# Patient Record
Sex: Male | Born: 1945 | Race: White | Hispanic: No | Marital: Married | Smoking: Never smoker
Health system: Southern US, Community
[De-identification: ages and names within clinical notes are randomized; demographics above are authoritative.]

## PROBLEM LIST (undated history)

## (undated) DIAGNOSIS — E785 Hyperlipidemia, unspecified: Secondary | ICD-10-CM

## (undated) DIAGNOSIS — Z86010 Personal history of colonic polyps: Secondary | ICD-10-CM

## (undated) HISTORY — PX: EYE SURGERY: SHX253

## (undated) HISTORY — PX: TONSILECTOMY, ADENOIDECTOMY, BILATERAL MYRINGOTOMY AND TUBES: SHX2538

## (undated) HISTORY — PX: INGUINAL HERNIA REPAIR: SUR1180

## (undated) HISTORY — DX: Personal history of colonic polyps: Z86.010

## (undated) HISTORY — PX: BACK SURGERY: SHX140

## (undated) HISTORY — DX: Hyperlipidemia, unspecified: E78.5

---

## 2005-06-10 HISTORY — PX: COLONOSCOPY W/ POLYPECTOMY: SHX1380

## 2009-06-13 ENCOUNTER — Ambulatory Visit: Payer: Self-pay | Admitting: Family Medicine

## 2009-06-13 DIAGNOSIS — E781 Pure hyperglyceridemia: Secondary | ICD-10-CM | POA: Insufficient documentation

## 2009-06-13 LAB — CONVERTED CEMR LAB
Bilirubin Urine: NEGATIVE
Blood in Urine, dipstick: NEGATIVE
Ketones, urine, test strip: NEGATIVE
Nitrite: NEGATIVE
Urobilinogen, UA: 0.2

## 2009-06-19 LAB — CONVERTED CEMR LAB
Alkaline Phosphatase: 81 units/L (ref 39–117)
BUN: 20 mg/dL (ref 6–23)
Basophils Absolute: 0 10*3/uL (ref 0.0–0.1)
Basophils Relative: 0.9 % (ref 0.0–3.0)
Bilirubin, Direct: 0.1 mg/dL (ref 0.0–0.3)
CO2: 30 meq/L (ref 19–32)
Calcium: 9.8 mg/dL (ref 8.4–10.5)
Cholesterol: 147 mg/dL (ref 0–200)
Creatinine, Ser: 1 mg/dL (ref 0.4–1.5)
Eosinophils Absolute: 0.1 10*3/uL (ref 0.0–0.7)
HDL: 38.9 mg/dL — ABNORMAL LOW (ref >39.00)
LDL Cholesterol: 89 mg/dL (ref 0–99)
Lymphocytes Relative: 36.7 % (ref 12.0–46.0)
MCHC: 33.2 g/dL (ref 30.0–36.0)
MCV: 91.2 fL (ref 78.0–100.0)
Monocytes Absolute: 0.5 10*3/uL (ref 0.1–1.0)
Neutrophils Relative %: 48.8 % (ref 43.0–77.0)
PSA: 1.92 ng/mL (ref 0.10–4.00)
Platelets: 213 10*3/uL (ref 150.0–400.0)
RBC: 4.67 M/uL (ref 4.22–5.81)
Total Bilirubin: 1 mg/dL (ref 0.3–1.2)
Total CHOL/HDL Ratio: 4
Total Protein: 7.7 g/dL (ref 6.0–8.3)
Triglycerides: 98 mg/dL (ref 0.0–149.0)
WBC: 4.9 10*3/uL (ref 4.5–10.5)

## 2010-07-09 ENCOUNTER — Other Ambulatory Visit: Payer: Self-pay | Admitting: Family Medicine

## 2010-07-09 ENCOUNTER — Ambulatory Visit
Admission: RE | Admit: 2010-07-09 | Discharge: 2010-07-09 | Payer: Self-pay | Source: Home / Self Care | Attending: Family Medicine | Admitting: Family Medicine

## 2010-07-09 ENCOUNTER — Encounter: Payer: Self-pay | Admitting: Family Medicine

## 2010-07-09 LAB — LIPID PANEL
Cholesterol: 151 mg/dL (ref 0–200)
HDL: 42.2 mg/dL (ref >39.00)
LDL Cholesterol: 93 mg/dL (ref 0–99)
Triglycerides: 79 mg/dL (ref 0.0–149.0)
VLDL: 15.8 mg/dL (ref 0.0–40.0)

## 2010-07-09 LAB — CONVERTED CEMR LAB
Bilirubin Urine: NEGATIVE
Blood in Urine, dipstick: NEGATIVE
Ketones, urine, test strip: NEGATIVE
Specific Gravity, Urine: 1.025
Urobilinogen, UA: 0.2

## 2010-07-09 LAB — CBC WITH DIFFERENTIAL/PLATELET
Basophils Absolute: 0 10*3/uL (ref 0.0–0.1)
Eosinophils Relative: 2.4 % (ref 0.0–5.0)
HCT: 40.6 % (ref 39.0–52.0)
Lymphs Abs: 1.5 10*3/uL (ref 0.7–4.0)
Monocytes Relative: 7.1 % (ref 3.0–12.0)
Neutrophils Relative %: 58.3 % (ref 43.0–77.0)
Platelets: 205 10*3/uL (ref 150.0–400.0)
RDW: 12.9 % (ref 11.5–14.6)
WBC: 4.8 10*3/uL (ref 4.5–10.5)

## 2010-07-09 LAB — HEPATIC FUNCTION PANEL
ALT: 21 U/L (ref 0–53)
AST: 27 U/L (ref 0–37)
Bilirubin, Direct: 0.1 mg/dL (ref 0.0–0.3)
Total Bilirubin: 0.8 mg/dL (ref 0.3–1.2)

## 2010-07-09 LAB — BASIC METABOLIC PANEL
BUN: 23 mg/dL (ref 6–23)
Calcium: 9.5 mg/dL (ref 8.4–10.5)
Creatinine, Ser: 1.2 mg/dL (ref 0.4–1.5)
GFR: 66.49 mL/min (ref >60.00)
Glucose, Bld: 86 mg/dL (ref 70–99)
Potassium: 4.1 mEq/L (ref 3.5–5.1)

## 2010-07-09 LAB — PSA: PSA: 1.71 ng/mL (ref 0.10–4.00)

## 2010-07-09 LAB — TSH: TSH: 3.25 u[IU]/mL (ref 0.35–5.50)

## 2010-07-10 ENCOUNTER — Telehealth: Payer: Self-pay | Admitting: Family Medicine

## 2010-07-10 NOTE — Assessment & Plan Note (Signed)
Summary: new to est-will fast-requesting cpx//ccm   Vital Signs:  Patient profile:   65 year old male Height:      73.5 inches Weight:      190 pounds BMI:     24.82 O2 Sat:      97 % on Room air Temp:     97.9 degrees F oral Pulse rate:   68 / minute BP sitting:   126 / 80  (left arm)  O2 Flow:  Room air CC: NP--Here to est care and requests CPX. Pt denies any problems/symptoms./kb Is Patient Diabetic? No Pain Assessment Patient in pain? no        CC:  NP--Here to est care and requests CPX. Pt denies any problems/symptoms./kb.  History of Present Illness: Hafiz is a 65 year old, married male, nonsmoker, who comes in today as a new patient for evaluation of hyperlipidemia.  He, states he's always been a healthy side.  No chronic health problems.  He does have a history of hypertriglyceridemia.  He is on gemfibrozil 600 mg b.i.d., and one aspirin tablet daily.  He gets routine eye care.  Dental care.  Colonoscopy done in GI 2007 showed a, polyp.  He's due for a follow-up colonoscopy in 2012.  Tetanus booster, unknown.  He'll check his records.  Requesting a flu shot today.  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  Will write him a prescription for the Ana-Kit  Preventive Screening-Counseling & Management  Alcohol-Tobacco     Smoking Status: never  Caffeine-Diet-Exercise     Does Patient Exercise: yes      Drug Use:  no.    Current Medications (verified): 1)  Gemfibrozil 600 Mg Tabs (Gemfibrozil) .Marland Kitchen.. 1 By Mouth Bid 2)  Mvi .Marland Kitchen.. 1 By Mouth Qd 3)  Aspir-Low 81 Mg Tbec (Aspirin) .Marland Kitchen.. 1 By Mouth Qd  Allergies (verified): No Known Drug Allergies  Past History:  Past Medical History: tonsillectomy hernia basal cell carcinoma, right arm colon polyp x 1 anaphylactic reaction to bee sting  Family History: Reviewed history and no changes required. father recently died at age 88 old age  mother died 94,  complications of diabetesno sisters  two brothers, one has a possible bladder cancer  Social History: Reviewed history and no changes required. Retired Married Never Smoked Alcohol use-no Drug use-no Regular exercise-yes Smoking Status:  never Drug Use:  no Does Patient Exercise:  yes  Review of Systems      See HPI  Physical Exam  General:  Well-developed,well-nourished,in no acute distress; alert,appropriate and cooperative throughout examination Head:  Normocephalic and atraumatic without obvious abnormalities. No apparent alopecia or balding. Eyes:  No corneal or conjunctival inflammation noted. EOMI. Perrla. Funduscopic exam benign, without hemorrhages, exudates or papilledema. Vision grossly normal. Ears:  External ear exam shows no significant lesions or deformities.  Otoscopic examination reveals clear canals, tympanic membranes are intact bilaterally without bulging, retraction, inflammation or discharge. Hearing is grossly normal bilaterally. Nose:  External nasal examination shows no deformity or inflammation. Nasal mucosa are pink and moist without lesions or exudates. Mouth:  Oral mucosa and oropharynx without lesions or exudates.  Teeth in good repair. Neck:  No deformities, masses, or tenderness noted. Chest Wall:  No deformities, masses, tenderness or gynecomastia noted. Breasts:  No masses or gynecomastia noted Lungs:  Normal respiratory effort, chest expands symmetrically. Lungs are clear to auscultation, no crackles or wheezes. Heart:  Normal rate and regular rhythm.  S1 and S2 normal without gallop, murmur, click, rub or other extra sounds. Abdomen:  Bowel sounds positive,abdomen soft and non-tender without masses, organomegaly or hernias noted. Rectal:  No external abnormalities noted. Normal sphincter tone. No rectal masses or tenderness. Genitalia:  Testes bilaterally descended without nodularity, tenderness or masses. No scrotal masses or lesions. No penis  lesions or urethral discharge. Prostate:  Prostate gland firm and smooth, no enlargement, nodularity, tenderness, mass, asymmetry or induration. Msk:  No deformity or scoliosis noted of thoracic or lumbar spine.   Pulses:  R and L carotid,radial,femoral,dorsalis pedis and posterior tibial pulses are full and equal bilaterally Extremities:  No clubbing, cyanosis, edema, or deformity noted with normal full range of motion of all joints.   Neurologic:  No cranial nerve deficits noted. Station and gait are normal. Plantar reflexes are down-going bilaterally. DTRs are symmetrical throughout. Sensory, motor and coordinative functions appear intact. Skin:  scar right biceps from previous basal cell carcinoma removal.  Numerous freckles and moles nothing unusual.  Skin changes, consistent with severe childhood [place] Cervical Nodes:  No lymphadenopathy noted Axillary Nodes:  No palpable lymphadenopathy Inguinal Nodes:  No significant adenopathy Psych:  Cognition and judgment appear intact. Alert and cooperative with normal attention span and concentration. No apparent delusions, illusions, hallucinations   Impression & Recommendations:  Problem # 1:  Preventive Health Care (ICD-V70.0) Assessment New  Problem # 2:  HYPERTRIGLYCERIDEMIA (ICD-272.1) Assessment: New  His updated medication list for this problem includes:    Gemfibrozil 600 Mg Tabs (Gemfibrozil) .Marland Kitchen... 1 by mouth bid  Orders: Venipuncture (36644) TLB-Lipid Panel (80061-LIPID) TLB-BMP (Basic Metabolic Panel-BMET) (80048-METABOL) TLB-CBC Platelet - w/Differential (85025-CBCD) TLB-Hepatic/Liver Function Pnl (80076-HEPATIC) TLB-TSH (Thyroid Stimulating Hormone) (84443-TSH) TLB-PSA (Prostate Specific Antigen) (84153-PSA) Prescription Created Electronically [phone]) UA Dipstick w/o Micro (automated)  (81003)  Complete Medication List: 1)  Gemfibrozil 600 Mg Tabs (Gemfibrozil) .Marland Kitchen.. 1 by mouth bid 2)  Mvi  .Marland Kitchen.. 1 by mouth qd 3)   Aspir-low 81 Mg Tbec (Aspirin) .Marland Kitchen.. 1 by mouth qd 4)  Epipen 2-pak 0.3 Mg/0.39ml Devi (Epinephrine) .... Uad  Other Orders: Flu Vaccine 65yrs + (25956) Administration Flu vaccine - MCR (G0008) EKG w/ Interpretation (93000)  Patient Instructions: 1)  Please schedule a follow-up appointment in 1 year. 2)  It is important that you exercise regularly at least 20 minutes 5 times a week. If you develop chest pain, have severe difficulty breathing, or feel very tired , stop exercising immediately and seek medical attention. 3)  Schedule a colonoscopy/sigmoidoscopy to help detect colon cancer. 4)  Take an Aspirin every day. 5)  remember to keep a current ana - kit with you.   6)  also the ear wax kit for your right ear canal Prescriptions: EPIPEN 2-PAK 0.3 MG/0.3ML DEVI (EPINEPHRINE) UAD  #1 x 1   Entered and Authorized by:   Roderick Pee MD   Signed by:   Roderick Pee MD on 06/13/2009   Method used:   Electronically to        [facility]* (mail-order)             ,          Ph: [phone]       Fax: [phone]   RxID:   [id number] GEMFIBROZIL 600 MG TABS (GEMFIBROZIL) 1 by mouth bid  #200 x 3   Entered and Authorized by:   Roderick Pee MD   Signed by:   Roderick Pee MD on 06/13/2009  Method used:   Electronically to        [facility]* (mail-order)             ,          Ph: [phone]       Fax: [phone]   RxID:   [id number]   Flu Vaccine Consent Questions     Do you have a history of severe allergic reactions to this vaccine? no    Any prior history of allergic reactions to egg and/or gelatin? no    Do you have a sensitivity to the preservative Thimersol? no    Do you have a past history of Guillan-Barre Syndrome? no    Do you currently have an acute febrile illness? no    Have you ever had a severe reaction to latex? no    Vaccine information given and explained to patient? yes    Are you currently pregnant? no    Lot Number:[id number]   Exp  Date:12/07/2009   Site Given  Right Deltoid IMdflu Lucious Groves, CMA  Laboratory Results   Urine Tests    Routine Urinalysis   Color: yellow Appearance: Clear Glucose: negative   (Normal Range: Negative) Bilirubin: negative   (Normal Range: Negative) Ketone: negative   (Normal Range: Negative) Spec. Gravity: 1.020   (Normal Range: 1.003-1.035) Blood: negative   (Normal Range: Negative) pH: 5.5   (Normal Range: 5.0-8.0) Protein: negative   (Normal Range: Negative) Urobilinogen: 0.2   (Normal Range: 0-1) Nitrite: negative   (Normal Range: Negative) Leukocyte Esterace: negative   (Normal Range: Negative)    Comments: Joanne Chars CMA  June 13, 2009 12:19 PM

## 2010-07-18 NOTE — Progress Notes (Signed)
Summary: lab results  Phone Note Call from Patient Call back at Home Phone [phone]   Caller: Patient Call For: Roderick Pee MD Summary of Call: Pt is asking to speak to Fleet Contras more about lab results.  Initial call taken by: Lynann Beaver CMA AAMA,  July 10, 2010 9:05 AM  Follow-up for Phone Call        Phone Call Completed Follow-up by: Kern Reap CMA Duncan Dull),  July 10, 2010 11:58 AM

## 2010-07-18 NOTE — Assessment & Plan Note (Signed)
Summary: CPX ( PT WILL COME IN FASTING ) // RS   Vital Signs:  Patient profile:   65 year old male Height:      73.75 inches Weight:      182 pounds BMI:     23.61 Temp:     97.9 degrees F oral BP sitting:   120 / 80  (left arm) Cuff size:   regular  Vitals Entered By: Kern Reap CMA Duncan Dull) (July 09, 2010 8:25 AM)  History of Present Illness: Marcus Reynolds is an 65 year old, married male, nonsmoker, who comes in today for Medicare wellness examination because of a history of underlying hyperlipidemia.  He has had a history of hypertriglyceridemia and has been on gemfibrozil 1200 mg.  Daily, along with an 81 mg, baby aspirin.  We will check his lipid panel today.  History GI care, dental care, colonoscopy in [place], 2006 showed a polyp.  He is to have a follow-up colonoscopy.  This summer.  Tetanus 2005, shingles 2008, Pneumovax today.  Encouraged annual flu shot in the future  He walks 3 miles per day, weight good at 182, no chest pain, shortness of breath.  No ED Here for Medicare AWV:  1.   Risk factors based on Past M, S, F history:..reviewed no changes 2.   Physical Activities: walks daily 3.   Depression/mood: good mood.  No depression 4.   Hearing: normal 5.   ADL's: functions independently 6.   Fall Risk: reviewed.  None identified 7.   Home Safety: no guns in the house 8.   Height, weight, &visual acuity:height weight, normal.  Vision normal 9.   Counseling: continue good health habits 10.   Labs ordered based on risk factors: done today 11.           Referral Coordination.......none indicated 12.           Care Plan.......Marland Kitchenreviewed medications 13.            Cognitive Assessment .Marland Kitchen..oriented x 3 financially independent  Anticoagulation Management History:      Positive risk factors for bleeding include an age of 65 years or older.  The bleeding index is 'intermediate risk'.  Negative CHADS2 values include Age > 65 years old.     Allergies (verified): No Known Drug  Allergies  Past History:  Past medical, surgical, family and social histories (including risk factors) reviewed, and no changes noted (except as noted below).  Past Medical History: Reviewed history from 06/13/2009 and no changes required. tonsillectomy hernia basal cell carcinoma, right arm colon polyp x 1 anaphylactic reaction to bee sting  Family History: Reviewed history from 06/13/2009 and no changes required. father recently died at age 65 old age  mother died 65, complications of diabetesno sisters  two brothers, one has a possible bladder cancer  Social History: Reviewed history from 06/13/2009 and no changes required. Retired Married Never Smoked Alcohol use-no Drug use-no Regular exercise-yes  Review of Systems      See HPI  Physical Exam  General:  Well-developed,well-nourished,in no acute distress; alert,appropriate and cooperative throughout examination Head:  Normocephalic and atraumatic without obvious abnormalities. No apparent alopecia or balding. Eyes:  No corneal or conjunctival inflammation noted. EOMI. Perrla. Funduscopic exam benign, without hemorrhages, exudates or papilledema. Vision grossly normal. Ears:  External ear exam shows no significant lesions or deformities.  Otoscopic examination reveals clear canals, tympanic membranes are intact bilaterally without bulging, retraction, inflammation or discharge. Hearing is grossly normal bilaterally. Nose:  External nasal examination  shows no deformity or inflammation. Nasal mucosa are pink and moist without lesions or exudates. Mouth:  Oral mucosa and oropharynx without lesions or exudates.  Teeth in good repair. Neck:  No deformities, masses, or tenderness noted. Chest Wall:  No deformities, masses, tenderness or gynecomastia noted. Breasts:  No masses or gynecomastia noted Lungs:  Normal respiratory effort, chest expands symmetrically. Lungs are clear to auscultation, no crackles or wheezes. Heart:   Normal rate and regular rhythm. S1 and S2 normal without gallop, murmur, click, rub or other extra sounds. Abdomen:  Bowel sounds positive,abdomen soft and non-tender without masses, organomegaly or hernias noted. Rectal:  No external abnormalities noted. Normal sphincter tone. No rectal masses or tenderness. Genitalia:  Testes bilaterally descended without nodularity, tenderness or masses. No scrotal masses or lesions. No penis lesions or urethral discharge. Prostate:  Prostate gland firm and smooth, no enlargement, nodularity, tenderness, mass, asymmetry or induration. Msk:  No deformity or scoliosis noted of thoracic or lumbar spine.   Pulses:  R and L carotid,radial,femoral,dorsalis pedis and posterior tibial pulses are full and equal bilaterally Extremities:  No clubbing, cyanosis, edema, or deformity noted with normal full range of motion of all joints.   Neurologic:  No cranial nerve deficits noted. Station and gait are normal. Plantar reflexes are down-going bilaterally. DTRs are symmetrical throughout. Sensory, motor and coordinative functions appear intact. Skin:  Intact without suspicious lesions or rashes Cervical Nodes:  No lymphadenopathy noted Axillary Nodes:  No palpable lymphadenopathy Inguinal Nodes:  No significant adenopathy Psych:  Cognition and judgment appear intact. Alert and cooperative with normal attention span and concentration. No apparent delusions, illusions, hallucinations   Impression & Recommendations:  Problem # 1:  HEALTH MAINTENANCE EXAM (ICD-V70.0) Assessment Unchanged  Orders: Venipuncture (16109) TLB-Lipid Panel (80061-LIPID) TLB-BMP (Basic Metabolic Panel-BMET) (80048-METABOL) TLB-CBC Platelet - w/Differential (85025-CBCD) TLB-Hepatic/Liver Function Pnl (80076-HEPATIC) TLB-TSH (Thyroid Stimulating Hormone) (84443-TSH) TLB-PSA (Prostate Specific Antigen) (84153-PSA) Prescription Created Electronically [phone]) Medicare -1st Annual Wellness Visit  [phone]) Urinalysis-dipstick only (Medicare patient) (91478GN) Specimen Handling (56213) EKG w/ Interpretation (93000)  Problem # 2:  HYPERTRIGLYCERIDEMIA (ICD-272.1) Assessment: Improved  His updated medication list for this problem includes:    Gemfibrozil 600 Mg Tabs (Gemfibrozil) .Marland Kitchen... 1 by mouth bid  Orders: Venipuncture (08657) TLB-Lipid Panel (80061-LIPID) TLB-BMP (Basic Metabolic Panel-BMET) (80048-METABOL) TLB-CBC Platelet - w/Differential (85025-CBCD) TLB-Hepatic/Liver Function Pnl (80076-HEPATIC) TLB-TSH (Thyroid Stimulating Hormone) (84443-TSH) TLB-PSA (Prostate Specific Antigen) (84153-PSA) Prescription Created Electronically [phone]) Medicare -1st Annual Wellness Visit [phone]) Urinalysis-dipstick only (Medicare patient) (41324MW) Specimen Handling (10272) EKG w/ Interpretation (93000)  Complete Medication List: 1)  Gemfibrozil 600 Mg Tabs (Gemfibrozil) .Marland Kitchen.. 1 by mouth bid 2)  Mvi  .Marland Kitchen.. 1 by mouth qd 3)  Aspir-low 81 Mg Tbec (Aspirin) .Marland Kitchen.. 1 by mouth qd 4)  Epipen 2-pak 0.3 Mg/0.36ml Devi (Epinephrine) .... Uad  Other Orders: Pneumococcal Vaccine (53664) Admin 1st Vaccine (40347)  Anticoagulation Management Assessment/Plan:            Patient Instructions: 1)  Please schedule a follow-up appointment in 1 year. Prescriptions: GEMFIBROZIL 600 MG TABS (GEMFIBROZIL) 1 by mouth bid  #200 x 3   Entered and Authorized by:   Roderick Pee MD   Signed by:   Roderick Pee MD on 07/09/2010   Method used:   Print then Give to Patient   RxID:   [id number] GEMFIBROZIL 600 MG TABS (GEMFIBROZIL) 1 by mouth bid  #200 x 3   Entered and Authorized by:   Roderick Pee MD  Signed by:   Roderick Pee MD on 07/09/2010   Method used:   Faxed to ...       [facility] (mail-order)             , [place]         Ph: [phone]       Fax: [phone]   RxID:   [id number]    Orders Added: 1)  Venipuncture [62952] 2)  TLB-Lipid Panel [80061-LIPID] 3)  TLB-BMP (Basic  Metabolic Panel-BMET) [80048-METABOL] 4)  TLB-CBC Platelet - w/Differential [85025-CBCD] 5)  TLB-Hepatic/Liver Function Pnl [80076-HEPATIC] 6)  TLB-TSH (Thyroid Stimulating Hormone) [84443-TSH] 7)  TLB-PSA (Prostate Specific Antigen) [84132-GMW] 8)  Prescription Created Electronically [G8553] 9)  Medicare -1st Annual Wellness Visit [G0438] 10)  Urinalysis-dipstick only (Medicare patient) [81003QW] 11)  Specimen Handling [99000] 12)  EKG w/ Interpretation [93000] 13)  Pneumococcal Vaccine [90732] 14)  Admin 1st Vaccine [90471]   Immunization History:  Zostavax History:    Zostavax # 1:  zostavax (06/10/2006)  Immunizations Administered:  Pneumonia Vaccine:    Vaccine Type: Pneumovax    Site: right deltoid    Mfr: Merck    Dose: 0.5 ml    Route: IM    Given by: Kern Reap CMA (AAMA)    Exp. Date: 10/10/2011    Lot #: [id number]    VIS given: 05/15/09 version given July 09, 2010.    Physician counseled: yes   Immunization History:  Zostavax History:    Zostavax # 1:  Zostavax (06/10/2006)  Immunizations Administered:  Pneumonia Vaccine:    Vaccine Type: Pneumovax    Site: right deltoid    Mfr: Merck    Dose: 0.5 ml    Route: IM    Given by: Kern Reap CMA (AAMA)    Exp. Date: 10/10/2011    Lot #: [id number]    VIS given: 05/15/09 version given July 09, 2010.    Physician counseled: yes    Laboratory Results   Urine Tests  Date/Time Recieved: July 09, 2010 11:43 AM  Date/Time Reported: July 09, 2010 11:43 AM   Routine Urinalysis   Color: yellow Appearance: Clear Glucose: negative   (Normal Range: Negative) Bilirubin: negative   (Normal Range: Negative) Ketone: negative   (Normal Range: Negative) Spec. Gravity: 1.025   (Normal Range: 1.003-1.035) Blood: negative   (Normal Range: Negative) pH: 5.5   (Normal Range: 5.0-8.0) Protein: negative   (Normal Range: Negative) Urobilinogen: 0.2   (Normal Range: 0-1) Nitrite: negative   (Normal Range:  Negative) Leukocyte Esterace: negative   (Normal Range: Negative)    Comments: Wynona Canes, CMA  July 09, 2010 11:43 AM

## 2010-11-12 ENCOUNTER — Telehealth: Payer: Self-pay | Admitting: Family Medicine

## 2010-11-12 DIAGNOSIS — Z1211 Encounter for screening for malignant neoplasm of colon: Secondary | ICD-10-CM

## 2010-11-12 DIAGNOSIS — Z0000 Encounter for general adult medical examination without abnormal findings: Secondary | ICD-10-CM

## 2010-11-12 NOTE — Telephone Encounter (Signed)
Was seen for a cpx. Checking on a referral to have a colonoscopy in July.

## 2010-11-13 NOTE — Telephone Encounter (Signed)
Order sent.

## 2010-11-15 ENCOUNTER — Encounter: Payer: Self-pay | Admitting: Internal Medicine

## 2010-12-04 ENCOUNTER — Ambulatory Visit (AMBULATORY_SURGERY_CENTER): Payer: Medicare HMO

## 2010-12-04 VITALS — Ht 74.0 in | Wt 183.0 lb

## 2010-12-04 DIAGNOSIS — Z86010 Personal history of colonic polyps: Secondary | ICD-10-CM

## 2010-12-04 MED ORDER — PEG-KCL-NACL-NASULF-NA ASC-C 100 G PO SOLR: 1.0000 | Freq: Once | ORAL | Status: AC

## 2010-12-05 ENCOUNTER — Encounter: Payer: Self-pay | Admitting: Internal Medicine

## 2010-12-25 ENCOUNTER — Ambulatory Visit (AMBULATORY_SURGERY_CENTER): Payer: Medicare HMO | Admitting: Internal Medicine

## 2010-12-25 ENCOUNTER — Encounter: Payer: Self-pay | Admitting: Internal Medicine

## 2010-12-25 VITALS — BP 144/102 | HR 67 | Temp 97.4°F | Resp 20 | Ht 74.0 in | Wt 178.0 lb

## 2010-12-25 DIAGNOSIS — Z860100 Personal history of colon polyps, unspecified: Secondary | ICD-10-CM

## 2010-12-25 DIAGNOSIS — Z1211 Encounter for screening for malignant neoplasm of colon: Secondary | ICD-10-CM

## 2010-12-25 DIAGNOSIS — K648 Other hemorrhoids: Secondary | ICD-10-CM

## 2010-12-25 HISTORY — DX: Personal history of colonic polyps: Z86.010

## 2010-12-25 HISTORY — PX: COLONOSCOPY: SHX174

## 2010-12-25 HISTORY — DX: Personal history of colon polyps, unspecified: Z86.0100

## 2010-12-25 MED ORDER — SODIUM CHLORIDE 0.9 % IV SOLN: 500.0000 mL | INTRAVENOUS | Status: DC

## 2010-12-25 NOTE — Patient Instructions (Signed)
Please refer to your blue and neon green sheets for instructions regarding diet and exercise for today.  You may resume your medications as you would normally take them.  Repeat Exam in 7 years

## 2010-12-26 ENCOUNTER — Telehealth: Payer: Self-pay

## 2010-12-26 NOTE — Telephone Encounter (Signed)

## 2011-03-26 ENCOUNTER — Ambulatory Visit (INDEPENDENT_AMBULATORY_CARE_PROVIDER_SITE_OTHER): Payer: Medicare HMO

## 2011-03-26 DIAGNOSIS — Z23 Encounter for immunization: Secondary | ICD-10-CM

## 2011-07-23 ENCOUNTER — Ambulatory Visit (INDEPENDENT_AMBULATORY_CARE_PROVIDER_SITE_OTHER): Payer: Medicare HMO | Admitting: Family Medicine

## 2011-07-23 ENCOUNTER — Encounter: Payer: Self-pay | Admitting: Family Medicine

## 2011-07-23 DIAGNOSIS — E781 Pure hyperglyceridemia: Secondary | ICD-10-CM

## 2011-07-23 DIAGNOSIS — N529 Male erectile dysfunction, unspecified: Secondary | ICD-10-CM

## 2011-07-23 DIAGNOSIS — Z79899 Other long term (current) drug therapy: Secondary | ICD-10-CM

## 2011-07-23 DIAGNOSIS — Z0000 Encounter for general adult medical examination without abnormal findings: Secondary | ICD-10-CM

## 2011-07-23 DIAGNOSIS — Z125 Encounter for screening for malignant neoplasm of prostate: Secondary | ICD-10-CM

## 2011-07-23 LAB — HEPATIC FUNCTION PANEL
Alkaline Phosphatase: 101 U/L (ref 39–117)
Bilirubin, Direct: 0.2 mg/dL (ref 0.0–0.3)
Total Bilirubin: 1.1 mg/dL (ref 0.3–1.2)
Total Protein: 7.2 g/dL (ref 6.0–8.3)

## 2011-07-23 LAB — BASIC METABOLIC PANEL
CO2: 29 mEq/L (ref 19–32)
Calcium: 9.9 mg/dL (ref 8.4–10.5)
Chloride: 105 mEq/L (ref 96–112)
Creatinine, Ser: 0.9 mg/dL (ref 0.4–1.5)
Glucose, Bld: 82 mg/dL (ref 70–99)

## 2011-07-23 LAB — POCT URINALYSIS DIPSTICK
Blood, UA: NEGATIVE
Ketones, UA: NEGATIVE
Protein, UA: NEGATIVE
Spec Grav, UA: 1.025
Urobilinogen, UA: 0.2

## 2011-07-23 LAB — CBC WITH DIFFERENTIAL/PLATELET
Basophils Relative: 0.5 % (ref 0.0–3.0)
Eosinophils Absolute: 0.1 10*3/uL (ref 0.0–0.7)
Eosinophils Relative: 2.8 % (ref 0.0–5.0)
HCT: 42.3 % (ref 39.0–52.0)
Hemoglobin: 14.2 g/dL (ref 13.0–17.0)
Lymphs Abs: 1.6 10*3/uL (ref 0.7–4.0)
MCHC: 33.5 g/dL (ref 30.0–36.0)
MCV: 90.3 fl (ref 78.0–100.0)
Monocytes Absolute: 0.4 10*3/uL (ref 0.1–1.0)
Neutro Abs: 3.1 10*3/uL (ref 1.4–7.7)
RBC: 4.68 Mil/uL (ref 4.22–5.81)
WBC: 5.3 10*3/uL (ref 4.5–10.5)

## 2011-07-23 LAB — LIPID PANEL
Cholesterol: 140 mg/dL (ref 0–200)
LDL Cholesterol: 84 mg/dL (ref 0–99)
Total CHOL/HDL Ratio: 3
VLDL: 13.4 mg/dL (ref 0.0–40.0)

## 2011-07-23 LAB — TSH: TSH: 2.45 u[IU]/mL (ref 0.35–5.50)

## 2011-07-23 MED ORDER — GEMFIBROZIL 600 MG PO TABS: 600.0000 mg | ORAL_TABLET | Freq: Two times a day (BID) | ORAL | Status: DC

## 2011-07-23 MED ORDER — SILDENAFIL CITRATE 50 MG PO TABS: 50.0000 mg | ORAL_TABLET | ORAL | Status: DC | PRN

## 2011-07-23 NOTE — Patient Instructions (Signed)
Continue your current medications  Initially try  the Viagra,,,,,,,,,,,,, one half tablet with water one to 2 hours prior to sex  If the 50 mg tablet works better than call and we will e-mail a new prescription for you  Return in one year sooner if any problems

## 2011-07-23 NOTE — Progress Notes (Signed)
  Subjective:    Patient ID: Marcus Reynolds, male    DOB: 1946-01-06, 66 y.o.   MRN: [id number]  HPI Everardo Beals is a 66 year old married male nonsmoker who comes in today for evaluation of hyperlipidemia and a new problem of erectile dysfunction  For hyperlipidemia he takes Lopid 600 mg twice a day and an aspirin tablet  He recently has been having difficulty with erectile dysfunction. We discussed all the options.    Review of Systems  Constitutional: Negative.   HENT: Negative.   Eyes: Negative.   Respiratory: Negative.   Cardiovascular: Negative.   Gastrointestinal: Negative.   Genitourinary: Negative.   Musculoskeletal: Negative.   Skin: Negative.   Neurological: Negative.   Hematological: Negative.   Psychiatric/Behavioral: Negative.        Objective:   Physical Exam  Constitutional: He is oriented to person, place, and time. He appears well-developed and well-nourished.  HENT:  Head: Normocephalic and atraumatic.  Right Ear: External ear normal.  Left Ear: External ear normal.  Nose: Nose normal.  Mouth/Throat: Oropharynx is clear and moist.  Eyes: Conjunctivae and EOM are normal. Pupils are equal, round, and reactive to light.  Neck: Normal range of motion. Neck supple. No JVD present. No tracheal deviation present. No thyromegaly present.  Cardiovascular: Normal rate, regular rhythm, normal heart sounds and intact distal pulses.  Exam reveals no gallop and no friction rub.   No murmur heard. Pulmonary/Chest: Effort normal and breath sounds normal. No stridor. No respiratory distress. He has no wheezes. He has no rales. He exhibits no tenderness.  Abdominal: Soft. Bowel sounds are normal. He exhibits no distension and no mass. There is no tenderness. There is no rebound and no guarding.  Genitourinary: Rectum normal, prostate normal and penis normal. Guaiac negative stool. No penile tenderness.  Musculoskeletal: Normal range of motion. He exhibits no edema and no tenderness.    Lymphadenopathy:    He has no cervical adenopathy.  Neurological: He is alert and oriented to person, place, and time. He has normal reflexes. No cranial nerve deficit. He exhibits normal muscle tone.  Skin: Skin is warm and dry. No rash noted. No erythema. No pallor.       Total body skin exam normal he has light skin and light eyes have a lot of sun damage. He has numerous capillary hemangiomas  Venous lake right scrotum  Psychiatric: He has a normal mood and affect. His behavior is normal. Judgment and thought content normal.          Assessment & Plan:  Healthy male  History of hyperlipidemia check labs  History of erectile dysfunction check labs prescribed Viagra  Chronic sun damage continue sunscreens and followup by dermatologist

## 2012-03-27 ENCOUNTER — Ambulatory Visit (INDEPENDENT_AMBULATORY_CARE_PROVIDER_SITE_OTHER): Payer: Medicare HMO

## 2012-03-27 DIAGNOSIS — Z23 Encounter for immunization: Secondary | ICD-10-CM

## 2012-07-23 ENCOUNTER — Encounter: Payer: Medicare HMO | Admitting: Family Medicine

## 2012-09-08 ENCOUNTER — Encounter: Payer: Medicare HMO | Admitting: Family Medicine

## 2012-09-22 ENCOUNTER — Encounter: Payer: Self-pay | Admitting: Family Medicine

## 2012-09-22 ENCOUNTER — Ambulatory Visit (INDEPENDENT_AMBULATORY_CARE_PROVIDER_SITE_OTHER): Payer: Medicare HMO | Admitting: Family Medicine

## 2012-09-22 VITALS — BP 110/78 | Temp 98.2°F | Ht 73.0 in | Wt 186.0 lb

## 2012-09-22 DIAGNOSIS — E781 Pure hyperglyceridemia: Secondary | ICD-10-CM

## 2012-09-22 DIAGNOSIS — N529 Male erectile dysfunction, unspecified: Secondary | ICD-10-CM

## 2012-09-22 DIAGNOSIS — Z86010 Personal history of colonic polyps: Secondary | ICD-10-CM

## 2012-09-22 DIAGNOSIS — Z0000 Encounter for general adult medical examination without abnormal findings: Secondary | ICD-10-CM

## 2012-09-22 LAB — HEPATIC FUNCTION PANEL
ALT: 20 U/L (ref 0–53)
AST: 24 U/L (ref 0–37)
Albumin: 4.5 g/dL (ref 3.5–5.2)
Alkaline Phosphatase: 85 U/L (ref 39–117)
Bilirubin, Direct: 0.2 mg/dL (ref 0.0–0.3)
Total Protein: 6.8 g/dL (ref 6.0–8.3)

## 2012-09-22 LAB — CBC WITH DIFFERENTIAL/PLATELET
Basophils Absolute: 0 10*3/uL (ref 0.0–0.1)
Eosinophils Absolute: 0.1 10*3/uL (ref 0.0–0.7)
Lymphocytes Relative: 34.5 % (ref 12.0–46.0)
MCHC: 32.7 g/dL (ref 30.0–36.0)
MCV: 90.8 fl (ref 78.0–100.0)
Monocytes Absolute: 0.4 10*3/uL (ref 0.1–1.0)
Neutrophils Relative %: 52.5 % (ref 43.0–77.0)
Platelets: 208 10*3/uL (ref 150.0–400.0)
RDW: 12.4 % (ref 11.5–14.6)

## 2012-09-22 LAB — BASIC METABOLIC PANEL
BUN: 20 mg/dL (ref 6–23)
Calcium: 9.4 mg/dL (ref 8.4–10.5)
GFR: 76.51 mL/min (ref >60.00)
Glucose, Bld: 77 mg/dL (ref 70–99)
Potassium: 4 mEq/L (ref 3.5–5.1)
Sodium: 139 mEq/L (ref 135–145)

## 2012-09-22 LAB — LIPID PANEL
Cholesterol: 132 mg/dL (ref 0–200)
LDL Cholesterol: 87 mg/dL (ref 0–99)
Triglycerides: 55 mg/dL (ref 0.0–149.0)

## 2012-09-22 LAB — POCT URINALYSIS DIPSTICK
Glucose, UA: NEGATIVE
Nitrite, UA: NEGATIVE
Protein, UA: NEGATIVE
Spec Grav, UA: 1.025
Urobilinogen, UA: 0.2

## 2012-09-22 MED ORDER — SILDENAFIL CITRATE 100 MG PO TABS: ORAL_TABLET | ORAL | Status: DC

## 2012-09-22 MED ORDER — GEMFIBROZIL 600 MG PO TABS: 600.0000 mg | ORAL_TABLET | Freq: Two times a day (BID) | ORAL | Status: DC

## 2012-09-22 NOTE — Progress Notes (Signed)
  Subjective:    Patient ID: Marcus Reynolds, male    DOB: 05/31/1946, 67 y.o.   MRN: [id number]  HPI Aiman is a 67 year old married male nonsmoker who comes in today for a Medicare wellness examination  He has a history of elevated triglycerides and takes Lopid 600 mg twice a day Will check labs today. He also takes an aspirin tablet and Viagra when necessary for ED  He gets routine eye care, dental care, followup colonoscopy and GI, vaccinations up-to-date  Cognitive function normal he walks on a regular basis home health safety reviewed no issues identified, no guns in the house, he does have a health care power of attorney and living well   Review of Systems  Constitutional: Negative.   HENT: Negative.   Eyes: Negative.   Respiratory: Negative.   Cardiovascular: Negative.   Gastrointestinal: Negative.   Genitourinary: Negative.   Musculoskeletal: Negative.   Skin: Negative.   Neurological: Negative.   Psychiatric/Behavioral: Negative.        Objective:   Physical Exam  Constitutional: He is oriented to person, place, and time. He appears well-developed and well-nourished.  HENT:  Head: Normocephalic and atraumatic.  Right Ear: External ear normal.  Left Ear: External ear normal.  Nose: Nose normal.  Mouth/Throat: Oropharynx is clear and moist.  Eyes: Conjunctivae and EOM are normal. Pupils are equal, round, and reactive to light.  Neck: Normal range of motion. Neck supple. No JVD present. No tracheal deviation present. No thyromegaly present.  Cardiovascular: Normal rate, regular rhythm, normal heart sounds and intact distal pulses.  Exam reveals no gallop and no friction rub.   No murmur heard. Pulmonary/Chest: Effort normal and breath sounds normal. No stridor. No respiratory distress. He has no wheezes. He has no rales. He exhibits no tenderness.  Abdominal: Soft. Bowel sounds are normal. He exhibits no distension and no mass. There is no tenderness. There is no rebound and  no guarding.  Genitourinary: Rectum normal and penis normal. Guaiac negative stool. No penile tenderness.  1+ symmetrical nonnodular BPH  Musculoskeletal: Normal range of motion. He exhibits no edema and no tenderness.  Lymphadenopathy:    He has no cervical adenopathy.  Neurological: He is alert and oriented to person, place, and time. He has normal reflexes. No cranial nerve deficit. He exhibits normal muscle tone.  Skin: Skin is warm and dry. No rash noted. No erythema. No pallor.  Total body skin exam normal he has a plethora of freckles moles capillaries hemangiomas skin damage from acne as a teenager. He's had basal cells removed by his dermatologist and sees them in August for skin evaluation yearly  Psychiatric: He has a normal mood and affect. His behavior is normal. Judgment and thought content normal.          Assessment & Plan:  Healthy male  Hypertriglyceridemia continue Lopid 600 twice a day and aspirin  Erectile dysfunction continue Viagra when necessary  Nocturia x3 DC caffeine  History of skin cancer basal cells followup by dermatology in August where sunscreens daily

## 2012-09-22 NOTE — Patient Instructions (Signed)
Continue the Lopid twice daily with an aspirin tablet  Viagra 100 mg when necessary  Go on a caffeine free diet for 2 weeks to see if it helps your urinary tract symptoms  Return in one year sooner if any problems

## 2012-09-23 ENCOUNTER — Encounter: Payer: Self-pay | Admitting: Family Medicine

## 2012-10-28 ENCOUNTER — Other Ambulatory Visit (INDEPENDENT_AMBULATORY_CARE_PROVIDER_SITE_OTHER): Payer: Medicare HMO

## 2012-10-28 DIAGNOSIS — D649 Anemia, unspecified: Secondary | ICD-10-CM

## 2012-10-28 DIAGNOSIS — R710 Precipitous drop in hematocrit: Secondary | ICD-10-CM

## 2012-10-28 LAB — CBC WITH DIFFERENTIAL/PLATELET
Basophils Relative: 0.7 % (ref 0.0–3.0)
Eosinophils Relative: 2 % (ref 0.0–5.0)
Lymphocytes Relative: 27.4 % (ref 12.0–46.0)
Neutrophils Relative %: 60.9 % (ref 43.0–77.0)
RBC: 4.49 Mil/uL (ref 4.22–5.81)
WBC: 5.5 10*3/uL (ref 4.5–10.5)

## 2012-11-04 NOTE — Telephone Encounter (Signed)
I had my hemoglobin rechecked last week (appt was on Wed 5/21). When Dr. Tawanna Cooler has a moment could he review the results and let me know if he comfortable with them, and should I resume my aspirin regimen? Also, should I continue to take the iron supplement?

## 2013-02-25 ENCOUNTER — Emergency Department (HOSPITAL_COMMUNITY)
Admission: EM | Admit: 2013-02-25 | Discharge: 2013-02-25 | Disposition: A | Discharge status: Home / Self Care | Payer: Medicare HMO | Source: Home / Self Care

## 2013-02-25 ENCOUNTER — Encounter (HOSPITAL_COMMUNITY): Payer: Self-pay | Admitting: Emergency Medicine

## 2013-02-25 DIAGNOSIS — R600 Localized edema: Secondary | ICD-10-CM

## 2013-02-25 DIAGNOSIS — R609 Edema, unspecified: Secondary | ICD-10-CM

## 2013-02-25 DIAGNOSIS — T63481A Toxic effect of venom of other arthropod, accidental (unintentional), initial encounter: Secondary | ICD-10-CM

## 2013-02-25 DIAGNOSIS — T6391XA Toxic effect of contact with unspecified venomous animal, accidental (unintentional), initial encounter: Secondary | ICD-10-CM

## 2013-02-25 MED ORDER — DOXYCYCLINE HYCLATE 100 MG PO CAPS: 100.0000 mg | ORAL_CAPSULE | Freq: Two times a day (BID) | ORAL | Status: DC

## 2013-02-25 NOTE — ED Provider Notes (Signed)
Medical screening examination/treatment/procedure(s) were performed by non-physician practitioner and as supervising physician I was immediately available for consultation/collaboration.  Leslee Home, M.D.  Reuben Likes, MD 02/25/13 2017

## 2013-02-25 NOTE — ED Provider Notes (Signed)
CSN: [id number]     Arrival date & time 02/25/13  1837 History   None    Chief Complaint  Patient presents with  . Leg Swelling   (Consider location/radiation/quality/duration/timing/severity/associated sxs/prior Treatment) HPI Comments: 67 year old male presents with swelling in the right lower leg since 2 days ago or approximate 36 hours. He felt a sting or a pinch to the right mid calf which eventually left a reddish purplish cutaneous discoloration; an hour or 2 later he developed itching around the area. Over the past 18-24 hours he noticed increased swelling of the right lower extremity in the ankle. He did walk 3 miles earlier today which seemed exacerbated the edema. He denies pain in the extremity and denies tenderness. He denies any known injury. Denies systemic symptoms.   Past Medical History  Diagnosis Date  . Personal history of colonic polyps 12/25/2010   Past Surgical History  Procedure Laterality Date  . Tonsilectomy, adenoidectomy, bilateral myringotomy and tubes    . Inguinal hernia repair      right side  . Colonoscopy w/ polypectomy  2007    [place], [place]  . Colonoscopy  12/25/2010    internal hemorrhoids   Family History  Problem Relation Age of Onset  . Heart disease Mother   . Diabetes Mother    History  Substance Use Topics  . Smoking status: Never Smoker   . Smokeless tobacco: Never Used  . Alcohol Use: No    Review of Systems  Constitutional: Negative.   HENT: Negative.   Respiratory: Negative.   Cardiovascular: Negative.   Gastrointestinal: Negative.   Genitourinary: Negative.   Neurological: Negative.     Allergies  Review of patient's allergies indicates no known allergies.  Home Medications   Current Outpatient Rx  Name  Route  Sig  Dispense  Refill  . aspirin 81 MG tablet   Oral   Take 81 mg by mouth daily.           Marland Kitchen doxycycline (VIBRAMYCIN) 100 MG capsule   Oral   Take 1 capsule (100 mg total) by mouth 2 (two) times  daily.   20 capsule   0   . gemfibrozil (LOPID) 600 MG tablet   Oral   Take 1 tablet (600 mg total) by mouth 2 (two) times daily before a meal.   200 tablet   3   . Multiple Vitamin (MULTIVITAMIN) tablet   Oral   Take 1 tablet by mouth daily.           . sildenafil (VIAGRA) 100 MG tablet      Uses directed   5 tablet   5   . EXPIRED: sildenafil (VIAGRA) 50 MG tablet   Oral   Take 1 tablet (50 mg total) by mouth as needed for erectile dysfunction.   6 tablet   11    BP 133/73  Pulse 63  Temp(Src) 97.3 F (36.3 C) (Oral)  Resp 16  SpO2 100% Physical Exam  Nursing note and vitals reviewed. Constitutional: He is oriented to person, place, and time. He appears well-developed and well-nourished. No distress.  Eyes: Conjunctivae and EOM are normal.  Neck: Normal range of motion. Neck supple.  Cardiovascular: Normal rate and regular rhythm.   Pulmonary/Chest: Effort normal and breath sounds normal. No respiratory distress.  Musculoskeletal:  There is edema involving two thirds of his right lower extremity below the knee. There is a cutaneous purplish mark in the proximal anterior shin. Distal to this mark there  are 2 abrasion areas located above the ankle. There is no tenderness anywhere in the length of the leg. Distal pulses 2+ distal neurovascular motor sensory is intact. There is a faint red heave surrounding the lateral malleolus. No lymphangitis. No open areas.  Neurological: He is alert and oriented to person, place, and time. He exhibits normal muscle tone.  Skin: Skin is warm and dry.  Psychiatric: He has a normal mood and affect.    ED Course  Procedures (including critical care time) Labs Review Labs Reviewed - No data to display Imaging Review No results found.  MDM   1. Insect sting allergy, current reaction, initial encounter   2. Edema of right lower extremity      Discussed instructions in detail. We discussed signs and symptoms of infection.  Should there be any increase or initiation of the symptoms started doxycycline twice a day tomorrow. For tonight and tomorrow do not want your regular 3 miles. Keep it elevated apply ice and take Benadryl every 4 hours.  Hayden Rasmussen, NP 02/25/13 2003

## 2013-02-25 NOTE — ED Notes (Signed)
Pt c/o right foot selling onset yest Believes he may have been bitten by an insect??? Sxs include: swelling and itching Denies: inj/trauma, pain He is alert w/no signs of acute distress.

## 2013-02-26 ENCOUNTER — Telehealth: Payer: Self-pay | Admitting: Family Medicine

## 2013-02-26 NOTE — Telephone Encounter (Signed)
[facility] Triage Call Report Triage Record Num: [id number] Operator: Tomasita Crumble Patient Name: Marcus Reynolds Call Date & Time: 02/25/2013 5:45:07PM Patient Phone: [phone] PCP: Eugenio Hoes. Todd Patient Gender: Male PCP Fax : [phone] Patient DOB: 12/06/1945 [facility] Name: [facility] Reason for Call: Patient reports insect bite to lower leg/ ankle 02/23/13; itched 02/25/13. Now lower calf and ankle swelling reported. Took antihistamines on 9/17 pm. Symptoms worsened after 12 hours. He did not feel anything like a sting from bee, yellow jacket, hornet or wasp. Emergent symptoms ruled out. See Provider within 4 hours due to Any signs ands symptoms of localized infection that are worsening or tht have not improved with 24 hours of home care. Caller directed to [facility] per practice preference. Protocol(s) Used: Bites and Stings - Insects or Spiders Recommended Outcome per Protocol: See Provider within 4 hours Reason for Outcome: Any signs and symptoms of localized infection that are worsening or that have not improved with 24 hours of home care. Care Advice: ~ Call provider if symptoms worsen or new symptoms develop. Analgesic/Antipyretic Advice - Acetaminophen: Consider acetaminophen as directed on label or by pharmacist/provider for pain or fever PRECAUTIONS: - Use if there is no history of liver disease, alcoholism, or intake of three or more alcohol drinks per day - Only if approved by provider during pregnancy or when breastfeeding - During pregnancy, acetaminophen should not be taken more than 3 consecutive days without telling provider - Do not exceed recommended dose or frequency ~ Apply local moist heat (such as a warm, wet wash cloth covered with plastic wrap) to the area for 15-20 minutes every 2-3 hours while awake. ~ Analgesic/Antipyretic Advice - NSAIDs: Consider aspirin, ibuprofen, naproxen or ketoprofen for pain or fever as directed on label or by  pharmacist/provider. PRECAUTIONS: - If over 81 years of age, should not take longer than 1 week without consulting provider. EXCEPTIONS: - Should not be used if taking blood thinners or have bleeding problems. - Do not use if have history of sensitivity/allergy to any of these medications; or history of cardiovascular, ulcer, kidney, liver disease or diabetes unless approved by provider. - Do not exceed recommended dose or frequency. ~ 02/25/2013 6:01:48PM Page 1 of 1 [id number]

## 2013-03-24 ENCOUNTER — Ambulatory Visit (INDEPENDENT_AMBULATORY_CARE_PROVIDER_SITE_OTHER): Payer: Medicare HMO

## 2013-03-24 DIAGNOSIS — Z23 Encounter for immunization: Secondary | ICD-10-CM

## 2013-09-23 ENCOUNTER — Encounter: Payer: Self-pay | Admitting: Family Medicine

## 2013-09-23 ENCOUNTER — Ambulatory Visit (INDEPENDENT_AMBULATORY_CARE_PROVIDER_SITE_OTHER): Payer: Medicare HMO | Admitting: Family Medicine

## 2013-09-23 VITALS — BP 130/90 | Temp 97.5°F | Ht 73.5 in | Wt 180.0 lb

## 2013-09-23 DIAGNOSIS — N138 Other obstructive and reflux uropathy: Secondary | ICD-10-CM

## 2013-09-23 DIAGNOSIS — Z86010 Personal history of colonic polyps: Secondary | ICD-10-CM

## 2013-09-23 DIAGNOSIS — N401 Enlarged prostate with lower urinary tract symptoms: Secondary | ICD-10-CM

## 2013-09-23 DIAGNOSIS — Z0000 Encounter for general adult medical examination without abnormal findings: Secondary | ICD-10-CM | POA: Insufficient documentation

## 2013-09-23 DIAGNOSIS — R351 Nocturia: Secondary | ICD-10-CM

## 2013-09-23 DIAGNOSIS — E781 Pure hyperglyceridemia: Secondary | ICD-10-CM

## 2013-09-23 DIAGNOSIS — Z23 Encounter for immunization: Secondary | ICD-10-CM

## 2013-09-23 DIAGNOSIS — N529 Male erectile dysfunction, unspecified: Secondary | ICD-10-CM

## 2013-09-23 LAB — BASIC METABOLIC PANEL WITH GFR
BUN: 23 mg/dL (ref 6–23)
CO2: 28 meq/L (ref 19–32)
Calcium: 10.1 mg/dL (ref 8.4–10.5)
Chloride: 100 meq/L (ref 96–112)
Creatinine, Ser: 1 mg/dL (ref 0.4–1.5)
GFR: 75.43 mL/min
Glucose, Bld: 81 mg/dL (ref 70–99)
Potassium: 4.9 meq/L (ref 3.5–5.1)
Sodium: 138 meq/L (ref 135–145)

## 2013-09-23 LAB — LIPID PANEL
Cholesterol: 155 mg/dL (ref 0–200)
HDL: 45.6 mg/dL
LDL Cholesterol: 101 mg/dL — ABNORMAL HIGH (ref 0–99)
Total CHOL/HDL Ratio: 3
Triglycerides: 43 mg/dL (ref 0.0–149.0)
VLDL: 8.6 mg/dL (ref 0.0–40.0)

## 2013-09-23 LAB — CBC WITH DIFFERENTIAL/PLATELET
Basophils Absolute: 0 K/uL (ref 0.0–0.1)
Basophils Relative: 0.5 % (ref 0.0–3.0)
Eosinophils Absolute: 0.1 K/uL (ref 0.0–0.7)
Eosinophils Relative: 2.2 % (ref 0.0–5.0)
HCT: 44.3 % (ref 39.0–52.0)
Hemoglobin: 14.9 g/dL (ref 13.0–17.0)
Lymphocytes Relative: 28 % (ref 12.0–46.0)
Lymphs Abs: 1.8 K/uL (ref 0.7–4.0)
MCHC: 33.6 g/dL (ref 30.0–36.0)
MCV: 90.8 fl (ref 78.0–100.0)
Monocytes Absolute: 0.5 K/uL (ref 0.1–1.0)
Monocytes Relative: 8 % (ref 3.0–12.0)
Neutro Abs: 3.9 K/uL (ref 1.4–7.7)
Neutrophils Relative %: 61.3 % (ref 43.0–77.0)
Platelets: 237 K/uL (ref 150.0–400.0)
RBC: 4.88 Mil/uL (ref 4.22–5.81)
RDW: 13.2 % (ref 11.5–14.6)
WBC: 6.4 K/uL (ref 4.5–10.5)

## 2013-09-23 LAB — HEPATIC FUNCTION PANEL
ALT: 18 U/L (ref 0–53)
AST: 26 U/L (ref 0–37)
Albumin: 4.5 g/dL (ref 3.5–5.2)
Alkaline Phosphatase: 103 U/L (ref 39–117)
Bilirubin, Direct: 0.1 mg/dL (ref 0.0–0.3)
Total Bilirubin: 1.2 mg/dL (ref 0.3–1.2)
Total Protein: 7.3 g/dL (ref 6.0–8.3)

## 2013-09-23 LAB — POCT URINALYSIS DIPSTICK
Bilirubin, UA: NEGATIVE
Blood, UA: NEGATIVE
Glucose, UA: NEGATIVE
KETONES UA: NEGATIVE
Leukocytes, UA: NEGATIVE
Nitrite, UA: NEGATIVE
PH UA: 5
PROTEIN UA: NEGATIVE
SPEC GRAV UA: 1.025
UROBILINOGEN UA: 0.2

## 2013-09-23 LAB — TSH: TSH: 3.76 u[IU]/mL (ref 0.35–5.50)

## 2013-09-23 LAB — PSA: PSA: 1.94 ng/mL (ref 0.10–4.00)

## 2013-09-23 MED ORDER — GEMFIBROZIL 600 MG PO TABS: 600.0000 mg | ORAL_TABLET | Freq: Two times a day (BID) | ORAL | Status: DC

## 2013-09-23 MED ORDER — SILDENAFIL CITRATE 100 MG PO TABS: ORAL_TABLET | ORAL | Status: DC

## 2013-09-23 NOTE — Progress Notes (Signed)
Pre visit review using our clinic review tool, if applicable. No additional management support is needed unless otherwise documented below in the visit note. 

## 2013-09-23 NOTE — Patient Instructions (Signed)
Lopid,,,,,,,,,,,, one twice daily  One aspirin daily  Viagra 100 mg............  Continue daily exercise and good diet followup in 1 year sooner if any problem

## 2013-09-23 NOTE — Progress Notes (Signed)
   Subjective:    Patient ID: Marcus Reynolds, male    DOB: 28-Jan-1946, 68 y.o.   MRN: [id number]  HPI Marcus Reynolds  is a 67 year old married male nonsmoker who comes in today for his Medicare wellness examination  He takes aspirin and Lopid for hyperlipidemia  He takes Viagra when necessary for ED  He gets routine eye care, dental care, frequent colonoscopies because of a history of colon polyps. His last colonoscopy is a 43 was normal.  Cognitive function normal he walks on a regular basis home health safety reviewed no issues identified, no guns in the house, he does have a health care power of attorney and living well.  Vaccinations updated by [name]   Review of Systems  Constitutional: Negative.   HENT: Negative.   Eyes: Negative.   Respiratory: Negative.   Cardiovascular: Negative.   Gastrointestinal: Negative.   Genitourinary: Negative.   Musculoskeletal: Negative.   Skin: Negative.   Neurological: Negative.   Psychiatric/Behavioral: Negative.        Objective:   Physical Exam  Nursing note and vitals reviewed. Constitutional: He is oriented to person, place, and time. He appears well-developed and well-nourished.  HENT:  Head: Normocephalic and atraumatic.  Right Ear: External ear normal.  Left Ear: External ear normal.  Nose: Nose normal.  Mouth/Throat: Oropharynx is clear and moist.  Eyes: Conjunctivae and EOM are normal. Pupils are equal, round, and reactive to light.  Neck: Normal range of motion. Neck supple. No JVD present. No tracheal deviation present. No thyromegaly present.  Cardiovascular: Normal rate, regular rhythm, normal heart sounds and intact distal pulses.  Exam reveals no gallop and no friction rub.   No murmur heard. No carotid or aortic bruits peripheral pulses 2+ and symmetrical  Pulmonary/Chest: Effort normal and breath sounds normal. No stridor. No respiratory distress. He has no wheezes. He has no rales. He exhibits no tenderness.  Abdominal: Soft.  Bowel sounds are normal. He exhibits no distension and no mass. There is no tenderness. There is no rebound and no guarding.  Genitourinary: Rectum normal and penis normal. Guaiac negative stool. No penile tenderness.  1+ symmetrical BPH nonnodular  Musculoskeletal: Normal range of motion. He exhibits no edema and no tenderness.  Lymphadenopathy:    He has no cervical adenopathy.  Neurological: He is alert and oriented to person, place, and time. He has normal reflexes. No cranial nerve deficit. He exhibits normal muscle tone.  Skin: Skin is warm and dry. No rash noted. No erythema. No pallor.  Total body skin exam normal,,,,,,, previous basal cell carcinoma removed from his right arm scar well-healed no evidence of recurrence. He also sees his dermatologist yearly  Psychiatric: He has a normal mood and affect. His behavior is normal. Judgment and thought content normal.          Assessment & Plan:  Healthy male  History of hyperlipidemia check labs  Erectile dysfunction refill Viagra

## 2014-03-22 ENCOUNTER — Ambulatory Visit (INDEPENDENT_AMBULATORY_CARE_PROVIDER_SITE_OTHER): Payer: Medicare HMO | Admitting: *Deleted

## 2014-03-22 ENCOUNTER — Encounter: Payer: Self-pay | Admitting: Family Medicine

## 2014-03-22 DIAGNOSIS — Z23 Encounter for immunization: Secondary | ICD-10-CM

## 2014-09-01 ENCOUNTER — Ambulatory Visit: Payer: Medicare HMO | Admitting: Family Medicine

## 2014-09-27 ENCOUNTER — Ambulatory Visit (INDEPENDENT_AMBULATORY_CARE_PROVIDER_SITE_OTHER): Payer: Medicare HMO | Admitting: Family Medicine

## 2014-09-27 ENCOUNTER — Encounter: Payer: Self-pay | Admitting: Physician Assistant

## 2014-09-27 ENCOUNTER — Ambulatory Visit: Payer: Medicare HMO | Admitting: Family Medicine

## 2014-09-27 ENCOUNTER — Encounter: Payer: Self-pay | Admitting: Family Medicine

## 2014-09-27 VITALS — BP 120/80 | Temp 98.1°F | Ht 73.75 in | Wt 180.4 lb

## 2014-09-27 DIAGNOSIS — N401 Enlarged prostate with lower urinary tract symptoms: Secondary | ICD-10-CM

## 2014-09-27 DIAGNOSIS — R195 Other fecal abnormalities: Secondary | ICD-10-CM | POA: Insufficient documentation

## 2014-09-27 DIAGNOSIS — E781 Pure hyperglyceridemia: Secondary | ICD-10-CM | POA: Diagnosis not present

## 2014-09-27 DIAGNOSIS — Z0000 Encounter for general adult medical examination without abnormal findings: Secondary | ICD-10-CM

## 2014-09-27 DIAGNOSIS — R351 Nocturia: Secondary | ICD-10-CM

## 2014-09-27 DIAGNOSIS — Z23 Encounter for immunization: Secondary | ICD-10-CM | POA: Diagnosis not present

## 2014-09-27 DIAGNOSIS — Z86010 Personal history of colonic polyps: Secondary | ICD-10-CM

## 2014-09-27 DIAGNOSIS — N529 Male erectile dysfunction, unspecified: Secondary | ICD-10-CM

## 2014-09-27 LAB — BASIC METABOLIC PANEL
BUN: 23 mg/dL (ref 6–23)
CHLORIDE: 103 meq/L (ref 96–112)
CO2: 31 meq/L (ref 19–32)
Calcium: 9.7 mg/dL (ref 8.4–10.5)
Creatinine, Ser: 1.13 mg/dL (ref 0.40–1.50)
GFR: 68.34 mL/min (ref >60.00)
Glucose, Bld: 100 mg/dL — ABNORMAL HIGH (ref 70–99)
Potassium: 4.3 mEq/L (ref 3.5–5.1)
SODIUM: 139 meq/L (ref 135–145)

## 2014-09-27 LAB — HEPATIC FUNCTION PANEL
ALT: 15 U/L (ref 0–53)
AST: 21 U/L (ref 0–37)
Albumin: 4.6 g/dL (ref 3.5–5.2)
Alkaline Phosphatase: 101 U/L (ref 39–117)
BILIRUBIN TOTAL: 0.8 mg/dL (ref 0.2–1.2)
Bilirubin, Direct: 0.2 mg/dL (ref 0.0–0.3)
Total Protein: 6.7 g/dL (ref 6.0–8.3)

## 2014-09-27 LAB — CBC WITH DIFFERENTIAL/PLATELET
BASOS PCT: 0.5 % (ref 0.0–3.0)
Basophils Absolute: 0 10*3/uL (ref 0.0–0.1)
Eosinophils Absolute: 0.1 10*3/uL (ref 0.0–0.7)
Eosinophils Relative: 2.2 % (ref 0.0–5.0)
HCT: 41.9 % (ref 39.0–52.0)
Hemoglobin: 14.1 g/dL (ref 13.0–17.0)
LYMPHS ABS: 1.5 10*3/uL (ref 0.7–4.0)
LYMPHS PCT: 29.8 % (ref 12.0–46.0)
MCHC: 33.7 g/dL (ref 30.0–36.0)
MCV: 89 fl (ref 78.0–100.0)
MONO ABS: 0.5 10*3/uL (ref 0.1–1.0)
MONOS PCT: 9.5 % (ref 3.0–12.0)
NEUTROS PCT: 58 % (ref 43.0–77.0)
Neutro Abs: 2.9 10*3/uL (ref 1.4–7.7)
Platelets: 222 10*3/uL (ref 150.0–400.0)
RBC: 4.71 Mil/uL (ref 4.22–5.81)
RDW: 13.1 % (ref 11.5–15.5)
WBC: 4.9 10*3/uL (ref 4.0–10.5)

## 2014-09-27 LAB — POCT URINALYSIS DIPSTICK
BILIRUBIN UA: NEGATIVE
Glucose, UA: NEGATIVE
Ketones, UA: NEGATIVE
Leukocytes, UA: NEGATIVE
NITRITE UA: NEGATIVE
Protein, UA: NEGATIVE
RBC UA: NEGATIVE
Spec Grav, UA: 1.025
Urobilinogen, UA: 0.2
pH, UA: 6

## 2014-09-27 LAB — LIPID PANEL
CHOL/HDL RATIO: 3
Cholesterol: 144 mg/dL (ref 0–200)
HDL: 42.7 mg/dL (ref >39.00)
LDL Cholesterol: 88 mg/dL (ref 0–99)
NonHDL: 101.3
Triglycerides: 69 mg/dL (ref 0.0–149.0)
VLDL: 13.8 mg/dL (ref 0.0–40.0)

## 2014-09-27 LAB — PSA: PSA: 2.32 ng/mL (ref 0.10–4.00)

## 2014-09-27 LAB — TSH: TSH: 1.96 u[IU]/mL (ref 0.35–4.50)

## 2014-09-27 MED ORDER — GEMFIBROZIL 600 MG PO TABS: 600.0000 mg | ORAL_TABLET | Freq: Two times a day (BID) | ORAL | Status: DC

## 2014-09-27 MED ORDER — SILDENAFIL CITRATE 20 MG PO TABS: 20.0000 mg | ORAL_TABLET | Freq: Three times a day (TID) | ORAL | Status: DC

## 2014-09-27 NOTE — Progress Notes (Signed)
Pre visit review using our clinic review tool, if applicable. No additional management support is needed unless otherwise documented below in the visit note. 

## 2014-09-27 NOTE — Patient Instructions (Signed)
Continue the Lopid........ stop the aspirin  No over-the-counter anti-inflammatories only Tylenol  I put in a call to GI for further evaluation  Generic Viagra 20 mg  Labs today we will call you the report  Talbert Forest,,,,,,,,,,,, our new adult nurse practitioner  Rachel's extension is [phone]

## 2014-09-27 NOTE — Progress Notes (Signed)
   Subjective:    Patient ID: Marcus Reynolds, male    DOB: 11-Jun-1945, 69 y.o.   MRN: [id number]  HPI  Marcus Reynolds is a 69 year old married male nonsmoker who comes in today for general physical examination because of a history of hyperlipidemia and erectile dysfunction  He takes Lopid 600 mg twice a day and an aspirin tablet. We'll check lipid panel today   he uses Viagra when necessary for ED   he gets routine eye care, dental care, colonoscopy and GI 2012. He's noticed over the last couple months that he now only has a bowel movement every 2-3 days. He has no abdominal pain and bloating weight loss or rectal bleeding.   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 Tetanus booster given today   Review of Systems  Constitutional: Negative.   HENT: Negative.   Eyes: Negative.   Respiratory: Negative.   Cardiovascular: Negative.   Gastrointestinal: Negative.   Endocrine: Negative.   Genitourinary: Negative.   Musculoskeletal: Negative.   Skin: Negative.   Allergic/Immunologic: Negative.   Neurological: Negative.   Hematological: Negative.   Psychiatric/Behavioral: Negative.        Objective:   Physical Exam  Constitutional: He is oriented to person, place, and time. He appears well-developed and well-nourished.  HENT:  Head: Normocephalic and atraumatic.  Right Ear: External ear normal.  Left Ear: External ear normal.  Nose: Nose normal.  Mouth/Throat: Oropharynx is clear and moist.  Eyes: Conjunctivae and EOM are normal. Pupils are equal, round, and reactive to light.  Neck: Normal range of motion. Neck supple. No JVD present. No tracheal deviation present. No thyromegaly present.  Cardiovascular: Normal rate, regular rhythm, normal heart sounds and intact distal pulses.  Exam reveals no gallop and no friction rub.     No murmur heard. Pulmonary/Chest: Effort normal and breath sounds normal. No stridor. No respiratory distress. He has no wheezes. He has no rales. He exhibits no tenderness.  Abdominal: Soft. Bowel sounds are normal. He exhibits no distension and no mass. There is no tenderness. There is no rebound and no guarding.  Genitourinary: Penis normal. Guaiac positive stool. No penile tenderness.  2+ symmetrical nonnodular BPH.........Marland Kitchen brown stool guaiac positive  Musculoskeletal: Normal range of motion. He exhibits no edema or tenderness.  Lymphadenopathy:    He has no cervical adenopathy.  Neurological: He is alert and oriented to person, place, and time. He has normal reflexes. No cranial nerve deficit. He exhibits normal muscle tone.  Skin: Skin is warm and dry. No rash noted. No erythema. No pallor.  Total body skin exam normal except for scar lower lumbar area from previous disc surgery and right groin from previous hernia surgery  Psychiatric: He has a normal mood and affect. His behavior is normal. Judgment and thought content normal.          Assessment & Plan:  Hyperlipidemia............. check labs  Erectile dysfunction...Marland KitchenMarland KitchenMarland Kitchen refill Viagra  Positive stool guaiac......... consult with GI ASAP  Status post lumbar disc surgery and right inguinal hernia surgery.Marland Kitchen

## 2014-10-03 ENCOUNTER — Telehealth: Payer: Self-pay | Admitting: Physician Assistant

## 2014-10-03 NOTE — Telephone Encounter (Signed)
He is a Consulting civil engineer for adult learners. He can come to afternoon appointments. Per his request, the appointment is moved to 1:30 pm on the same day 10/12/14.

## 2014-10-12 ENCOUNTER — Ambulatory Visit: Payer: Medicare HMO | Admitting: Physician Assistant

## 2014-10-12 ENCOUNTER — Encounter: Payer: Self-pay | Admitting: Physician Assistant

## 2014-10-12 ENCOUNTER — Ambulatory Visit (INDEPENDENT_AMBULATORY_CARE_PROVIDER_SITE_OTHER): Payer: Medicare HMO | Admitting: Physician Assistant

## 2014-10-12 VITALS — BP 112/78 | HR 68 | Ht 73.5 in | Wt 181.8 lb

## 2014-10-12 DIAGNOSIS — Z860100 Personal history of colon polyps, unspecified: Secondary | ICD-10-CM

## 2014-10-12 DIAGNOSIS — K921 Melena: Secondary | ICD-10-CM | POA: Diagnosis not present

## 2014-10-12 MED ORDER — NA SULFATE-K SULFATE-MG SULF 17.5-3.13-1.6 GM/177ML PO SOLN: ORAL | Status: DC

## 2014-10-12 NOTE — Progress Notes (Signed)
Patient ID: Marcus Reynolds, male   DOB: [birth year], 69 y.o.   MRN: [id number]   Subjective:    Patient ID: Marcus Reynolds, male    DOB: 1946/05/05, 69 y.o.   MRN: [id number]  HPI Marcus Reynolds is a pleasant 69 year old white male known to Dr. Carlean Purl. He has history of colon polyps with colonoscopy done in [place] in 2007. We do not have that path report. He had follow-up colonoscopy done in July 2012 which was negative with the exception of internal hemorrhoids. He was asked to follow-up in 7 years. He is referred today by Dr. Sherren Mocha after finding of Hemoccult positive stool on routine physical. Patient says that after that rectal exam per Dr. Sherren Mocha he noted some fresh bright red blood on the tissue. He says he has in the past very occasionally seen a small amount of bright red blood per rectum which she has attributed to hemorrhoids. He had not seen blood for a long time. He has no complaints of abdominal pain and changes in bowel habits etc. Labs are reviewed and most recent hemoglobin 14.1/hct 41.9  Family history is negative for colon cancer  Review of Systems Pertinent positive and negative review of systems were noted in the above HPI section.  All other review of systems was otherwise negative.  Outpatient Encounter Prescriptions as of 10/12/2014  Medication Sig  . gemfibrozil (LOPID) 600 MG tablet Take 1 tablet (600 mg total) by mouth 2 (two) times daily before a meal.  . Multiple Vitamin (MULTIVITAMIN) tablet Take 1 tablet by mouth daily.    . sildenafil (REVATIO) 20 MG tablet Take 1 tablet (20 mg total) by mouth 3 (three) times daily.  Marland Kitchen aspirin 81 MG tablet Take 81 mg by mouth daily.    . Na Sulfate-K Sulfate-Mg Sulf SOLN Take as directed for colonoscopy prep.   Facility-Administered Encounter Medications as of 10/12/2014  Medication  . 0.9 %  sodium chloride infusion   No Known Allergies Patient Active Problem List   Diagnosis Date Noted  . Heme positive stool 09/27/2014  . Routine general medical  examination at a health care facility 09/23/2013  . ED (erectile dysfunction) 07/23/2011  . History of colonic polyps 12/25/2010  . HYPERTRIGLYCERIDEMIA 06/13/2009   History   Social History  . Marital Status: Married    Spouse Name: N/A  . Number of Children: 2  . Years of Education: N/A   Occupational History  . Retired    Social History Main Topics  . Smoking status: Never Smoker   . Smokeless tobacco: Never Used  . Alcohol Use: 0.0 oz/week    0 Standard drinks or equivalent per week     Comment: socially  . Drug Use: No  . Sexual Activity: Not on file   Other Topics Concern  . Not on file   Social History Narrative    Marcus Reynolds family history includes Diabetes in his mother; Heart disease in his mother; Ulcerative colitis in his mother.      Objective:    Filed Vitals:   10/12/14 1321  BP: 112/78  Pulse: 68    Physical Exam  well-developed white male in no acute distress, pleasant blood pressure 112/78 pulse 68 height 6 foot 1 weight 181. HEENT; nontraumatic normocephalic EOMI PERRLA sclera anicteric, Supple; no JVD, Cardiovascular ;regular rate and rhythm with S1-S2 no murmur or gallop, Pulmonary; clear bilaterally, Abdomen; soft nontender nondistended bowel sounds are active there is no palpable mass or hepatosplenomegaly, Rectal; exam not done this  was done with recent physical exam per Dr. Sherren Mocha inpatient Hemoccult positive, Extremities; no clubbing cyanosis or edema skin warm and dry, Psych; mood and affect appropriate       Assessment & Plan:   #1 69 yo WM with heme positive stool on recent physical  Exam. R/O secondary to internal hemorrhoid vs occult lesion #2 personal hx of colon polyps ( ? Type -2007), negative colonoscopy 2012 #3 hyperlipidemia  Plan; Will schedule or colonoscopy with  Dr. Carlean Purl . Procedure discussed in detail with pt and he is agreeable to proceed.   Amy S Esterwood PA-C 10/12/2014   Cc: Dorena Cookey, MD

## 2014-10-12 NOTE — Patient Instructions (Signed)
You have been scheduled for a colonoscopy. Please follow written instructions given to you at your visit today.  Please pick up your prep supplies at the pharmacy within the next 1-3 days. [address]  If you use inhalers (even only as needed), please bring them with you on the day of your procedure. Your physician has requested that you go to www.startemmi.com and enter the access code given to you at your visit today. This web site gives a general overview about your procedure. However, you should still follow specific instructions given to you by our office regarding your preparation for the procedure.

## 2014-10-17 NOTE — Progress Notes (Signed)
Agree with Ms. Esterwood's assessment and plan. Carl E. Gessner, MD, FACG   

## 2014-12-02 ENCOUNTER — Telehealth: Payer: Self-pay | Admitting: Internal Medicine

## 2014-12-02 NOTE — Telephone Encounter (Signed)
I spoke with the patient and he has had several days of diarrhea and feels he has a "GI bug".  He is feeling a little better today. He is scheduled for 12/07/14.  He is advised to make sure that he is drinking plenty of fluid and plan on keeping his appt for 12/07/14.  He is advised that if anything changes and feels terrible on Monday he can call me and I will help him reschedule.  He agrees to this plan

## 2014-12-07 ENCOUNTER — Encounter: Payer: Self-pay | Admitting: Internal Medicine

## 2014-12-07 ENCOUNTER — Ambulatory Visit (AMBULATORY_SURGERY_CENTER): Payer: Medicare HMO | Admitting: Internal Medicine

## 2014-12-07 VITALS — BP 121/78 | HR 61 | Temp 97.6°F | Resp 16 | Ht 73.5 in | Wt 181.0 lb

## 2014-12-07 DIAGNOSIS — K648 Other hemorrhoids: Secondary | ICD-10-CM | POA: Diagnosis not present

## 2014-12-07 DIAGNOSIS — D125 Benign neoplasm of sigmoid colon: Secondary | ICD-10-CM

## 2014-12-07 DIAGNOSIS — Z86010 Personal history of colonic polyps: Secondary | ICD-10-CM

## 2014-12-07 DIAGNOSIS — K921 Melena: Secondary | ICD-10-CM

## 2014-12-07 DIAGNOSIS — D120 Benign neoplasm of cecum: Secondary | ICD-10-CM | POA: Diagnosis not present

## 2014-12-07 MED ORDER — SODIUM CHLORIDE 0.9 % IV SOLN: 500.0000 mL | INTRAVENOUS | Status: DC

## 2014-12-07 NOTE — Op Note (Signed)
[place]  [address]. [place], [address]   COLONOSCOPY PROCEDURE REPORT  PATIENT: Marcus Reynolds, Marcus Reynolds  MR#: [id number] BIRTHDATE: 1946-01-04 , 24  yrs. old GENDER: male ENDOSCOPIST: Gatha Mayer, MD, [facility] PROCEDURE DATE:  12/07/2014 PROCEDURE:   Colonoscopy, diagnostic and Colonoscopy with snare polypectomy First Screening Colonoscopy - Avg.  risk and is 50 yrs.  old or older - No.  Prior Negative Screening - Now for repeat screening. N/A  History of Adenoma - Now for follow-up colonoscopy & has been > or = to 3 yrs.  N/A  Polyps removed today? Yes ASA CLASS:   Class II INDICATIONS:Evaluation of unexplained GI bleeding and Patient is not applicable for Colorectal Neoplasm Risk Assessment for this procedure. MEDICATIONS: Propofol 250 mg IV, Monitored anesthesia care, and Lidocaine 40 mg IV  DESCRIPTION OF PROCEDURE:   After the risks benefits and alternatives of the procedure were thoroughly explained, informed consent was obtained.  The digital rectal exam revealed no abnormalities of the rectum and revealed no prostatic nodules. The LB [id number]  endoscope was introduced through the anus and advanced to the cecum, which was identified by both the appendix and ileocecal valve. No adverse events experienced.   The quality of the prep was excellent.  (MiraLax was used)  The instrument was then slowly withdrawn as the colon was fully examined. Estimated blood loss is zero unless otherwise noted in this procedure report.   COLON FINDINGS: Two sessile polyps ranging from 5 to 68mm in size were found in the sigmoid colon and at the cecum.  Polypectomies were performed with a cold snare.  The resection was complete, the polyp tissue was completely retrieved and sent to histology. Internal hemorrhoids were found.   The examination was otherwise normal.  Retroflexed views revealed internal hemorrhoids. The time to cecum = 4.3 Withdrawal time = 10.3   The scope  was withdrawn and the procedure completed. COMPLICATIONS: There were no immediate complications.  ENDOSCOPIC IMPRESSION: 1.   Two sessile polyps ranging from 5 to 74mm in size were found in the sigmoid colon and at the cecum; polypectomies were performed with a cold snare 2.   Internal hemorrhoids 3.   The examination was otherwise normal - excellent prep  RECOMMENDATIONS: 1.  Timing of repeat colonoscopy will be determined by pathology findings. 2.  Hemorrhoid banding if desired  eSigned:  Gatha Mayer, MD, [facility] 12/07/2014 3:24 PM   cc: Christie Nottingham, MD and The Patient

## 2014-12-07 NOTE — Patient Instructions (Addendum)
I found and removed 2 polyp that look benign. I also saw hemorrhoids. If you would like these treated with banding I can arrange that.  I will let you know pathology results and when to have another routine colonoscopy by mail.  I appreciate the opportunity to care for you. Gatha Mayer, MD, FACG  YOU HAD AN ENDOSCOPIC PROCEDURE TODAY AT [facility]:   Refer to the procedure report that was given to you for any specific questions about what was found during the examination.  If the procedure report does not answer your questions, please call your gastroenterologist to clarify.  If you requested that your care partner not be given the details of your procedure findings, then the procedure report has been included in a sealed envelope for you to review at your convenience later.  YOU SHOULD EXPECT: Some feelings of bloating in the abdomen. Passage of more gas than usual.  Walking can help get rid of the air that was put into your GI tract during the procedure and reduce the bloating. If you had a lower endoscopy (such as a colonoscopy or flexible sigmoidoscopy) you may notice spotting of blood in your stool or on the toilet paper. If you underwent a bowel prep for your procedure, you may not have a normal bowel movement for a few days.  Please Note:  You might notice some irritation and congestion in your nose or some drainage.  This is from the oxygen used during your procedure.  There is no need for concern and it should clear up in a day or so.  SYMPTOMS TO REPORT IMMEDIATELY:   Following lower endoscopy (colonoscopy or flexible sigmoidoscopy):  Excessive amounts of blood in the stool  Significant tenderness or worsening of abdominal pains  Swelling of the abdomen that is new, acute  Fever of 100F or higher  For urgent or emergent issues, a gastroenterologist can be reached at any hour by calling [phone].   DIET: Your first meal following the procedure  should be a small meal and then it is ok to progress to your normal diet. Heavy or fried foods are harder to digest and may make you feel nauseous or bloated.  Likewise, meals heavy in dairy and vegetables can increase bloating.  Drink plenty of fluids but you should avoid alcoholic beverages for 24 hours.  ACTIVITY:  You should plan to take it easy for the rest of today and you should NOT DRIVE or use heavy machinery until tomorrow (because of the sedation medicines used during the test).    FOLLOW UP: Our staff will call the number listed on your records the next business day following your procedure to check on you and address any questions or concerns that you may have regarding the information given to you following your procedure. If we do not reach you, we will leave a message.  However, if you are feeling well and you are not experiencing any problems, there is no need to return our call.  We will assume that you have returned to your regular daily activities without incident.  If any biopsies were taken you will be contacted by phone or by letter within the next 1-3 weeks.  Please call us at [phone] if you have not heard about the biopsies in 3 weeks.    SIGNATURES/CONFIDENTIALITY: You and/or your care partner have signed paperwork which will be entered into your electronic medical record.  These signatures attest to the fact  that that the information above on your After Visit Summary has been reviewed and is understood.  Full responsibility of the confidentiality of this discharge information lies with you and/or your care-partner.

## 2014-12-07 NOTE — Progress Notes (Signed)
Called to room to assist during endoscopic procedure.  Patient ID and intended procedure confirmed with present staff. Received instructions for my participation in the procedure from the performing physician.  

## 2014-12-07 NOTE — Progress Notes (Signed)
Stable to RR 

## 2014-12-08 ENCOUNTER — Telehealth: Payer: Self-pay | Admitting: Emergency Medicine

## 2014-12-08 NOTE — Telephone Encounter (Signed)

## 2014-12-13 ENCOUNTER — Encounter: Payer: Self-pay | Admitting: Internal Medicine

## 2014-12-13 DIAGNOSIS — Z86010 Personal history of colonic polyps: Secondary | ICD-10-CM

## 2014-12-13 NOTE — Progress Notes (Signed)
Quick Note:  2 adenomas - repeat colonoscopy 2021 ______

## 2015-03-14 ENCOUNTER — Ambulatory Visit (INDEPENDENT_AMBULATORY_CARE_PROVIDER_SITE_OTHER): Payer: Medicare HMO

## 2015-03-14 DIAGNOSIS — Z23 Encounter for immunization: Secondary | ICD-10-CM | POA: Diagnosis not present

## 2015-10-04 ENCOUNTER — Other Ambulatory Visit (INDEPENDENT_AMBULATORY_CARE_PROVIDER_SITE_OTHER): Payer: Medicare HMO

## 2015-10-04 DIAGNOSIS — Z125 Encounter for screening for malignant neoplasm of prostate: Secondary | ICD-10-CM | POA: Diagnosis not present

## 2015-10-04 DIAGNOSIS — Z0000 Encounter for general adult medical examination without abnormal findings: Secondary | ICD-10-CM

## 2015-10-04 LAB — CBC WITH DIFFERENTIAL/PLATELET
BASOS ABS: 0 10*3/uL (ref 0.0–0.1)
Basophils Relative: 0.7 % (ref 0.0–3.0)
EOS ABS: 0.2 10*3/uL (ref 0.0–0.7)
Eosinophils Relative: 4.1 % (ref 0.0–5.0)
HEMATOCRIT: 41.9 % (ref 39.0–52.0)
Hemoglobin: 14.1 g/dL (ref 13.0–17.0)
Lymphocytes Relative: 30.6 % (ref 12.0–46.0)
Lymphs Abs: 1.6 10*3/uL (ref 0.7–4.0)
MCHC: 33.5 g/dL (ref 30.0–36.0)
MCV: 88.8 fl (ref 78.0–100.0)
MONO ABS: 0.5 10*3/uL (ref 0.1–1.0)
Monocytes Relative: 10.1 % (ref 3.0–12.0)
NEUTROS ABS: 2.9 10*3/uL (ref 1.4–7.7)
NEUTROS PCT: 54.5 % (ref 43.0–77.0)
PLATELETS: 232 10*3/uL (ref 150.0–400.0)
RBC: 4.72 Mil/uL (ref 4.22–5.81)
RDW: 13.1 % (ref 11.5–15.5)
WBC: 5.3 10*3/uL (ref 4.0–10.5)

## 2015-10-04 LAB — POC URINALSYSI DIPSTICK (AUTOMATED)
BILIRUBIN UA: NEGATIVE
GLUCOSE UA: NEGATIVE
Ketones, UA: NEGATIVE
Leukocytes, UA: NEGATIVE
NITRITE UA: NEGATIVE
Protein, UA: NEGATIVE
RBC UA: NEGATIVE
Spec Grav, UA: 1.025
Urobilinogen, UA: 0.2
pH, UA: 5.5

## 2015-10-04 LAB — HEPATIC FUNCTION PANEL
ALK PHOS: 101 U/L (ref 39–117)
ALT: 14 U/L (ref 0–53)
AST: 22 U/L (ref 0–37)
Albumin: 4.5 g/dL (ref 3.5–5.2)
BILIRUBIN DIRECT: 0.2 mg/dL (ref 0.0–0.3)
BILIRUBIN TOTAL: 0.8 mg/dL (ref 0.2–1.2)
TOTAL PROTEIN: 6.9 g/dL (ref 6.0–8.3)

## 2015-10-04 LAB — BASIC METABOLIC PANEL
BUN: 21 mg/dL (ref 6–23)
CALCIUM: 10.1 mg/dL (ref 8.4–10.5)
CO2: 32 meq/L (ref 19–32)
CREATININE: 1.07 mg/dL (ref 0.40–1.50)
Chloride: 104 mEq/L (ref 96–112)
GFR: 72.57 mL/min (ref >60.00)
Glucose, Bld: 90 mg/dL (ref 70–99)
Potassium: 4.4 mEq/L (ref 3.5–5.1)
Sodium: 142 mEq/L (ref 135–145)

## 2015-10-04 LAB — LIPID PANEL
CHOLESTEROL: 153 mg/dL (ref 0–200)
HDL: 45.6 mg/dL (ref >39.00)
LDL CALC: 93 mg/dL (ref 0–99)
NonHDL: 107.46
Total CHOL/HDL Ratio: 3
Triglycerides: 74 mg/dL (ref 0.0–149.0)
VLDL: 14.8 mg/dL (ref 0.0–40.0)

## 2015-10-04 LAB — PSA: PSA: 2.46 ng/mL (ref 0.10–4.00)

## 2015-10-04 LAB — TSH: TSH: 2.92 u[IU]/mL (ref 0.35–4.50)

## 2015-10-11 ENCOUNTER — Encounter: Payer: Self-pay | Admitting: Family Medicine

## 2015-10-11 ENCOUNTER — Ambulatory Visit (INDEPENDENT_AMBULATORY_CARE_PROVIDER_SITE_OTHER): Payer: Medicare HMO | Admitting: Family Medicine

## 2015-10-11 VITALS — BP 130/80 | Temp 98.3°F | Ht 73.0 in | Wt 178.0 lb

## 2015-10-11 DIAGNOSIS — E781 Pure hyperglyceridemia: Secondary | ICD-10-CM

## 2015-10-11 DIAGNOSIS — H6123 Impacted cerumen, bilateral: Secondary | ICD-10-CM

## 2015-10-11 DIAGNOSIS — Z0000 Encounter for general adult medical examination without abnormal findings: Secondary | ICD-10-CM

## 2015-10-11 DIAGNOSIS — Z0001 Encounter for general adult medical examination with abnormal findings: Secondary | ICD-10-CM

## 2015-10-11 DIAGNOSIS — E785 Hyperlipidemia, unspecified: Secondary | ICD-10-CM | POA: Diagnosis not present

## 2015-10-11 MED ORDER — GEMFIBROZIL 600 MG PO TABS: 600.0000 mg | ORAL_TABLET | Freq: Two times a day (BID) | ORAL | Status: DC

## 2015-10-11 NOTE — Patient Instructions (Signed)
Continue current medications  Over-the-counter earwax kit........ 2 drops in each ear canal bedtime..... Pack with cotton........Marland Kitchen Remove the cotton in the morning...Marland KitchenMarland KitchenMarland Kitchen Do this every night at bedtime for 2 weeks........ Then flushed with warm water  Continue good health habits diet and exercise   call in : November for your physical examination in May........... Tommi Rumps or Almyra Free are 2 new adult nurse practitioner's or Dr. Martinique

## 2015-10-11 NOTE — Progress Notes (Signed)
Pre visit review using our clinic review tool, if applicable. No additional management support is needed unless otherwise documented below in the visit note. 

## 2015-10-11 NOTE — Progress Notes (Signed)
   Subjective:    Patient ID: Marcus Reynolds, male    DOB: January 15, 1946, 70 y.o.   MRN: [id number]  HPI  Marcus Reynolds is a 70 year old married male nonsmoker who comes in today for general physical examination  He gets routine eye care, dental care, colonoscopy 2016 showed a couple polyps. He also had some internal hemorrhoids. He takes MiraLAX and that helps prevent constipation  Vaccinations up-to-date  He says she's had some floaters in both eyes. He's also on some difficulty seeing at night. Recommend he see Dr. Bing Plume  Cognitive function normal he walks daily home health safety reviewed no issues identified, no guns in the house, he does have a healthcare power of attorney and living will   Review of Systems  Constitutional: Negative.   HENT: Negative.   Eyes: Negative.   Respiratory: Negative.   Cardiovascular: Negative.   Gastrointestinal: Negative.   Endocrine: Negative.   Genitourinary: Negative.   Musculoskeletal: Negative.   Skin: Negative.   Allergic/Immunologic: Negative.   Neurological: Negative.   Hematological: Negative.   Psychiatric/Behavioral: Negative.        Objective:   Physical Exam  Constitutional: He is oriented to person, place, and time. He appears well-developed and well-nourished.  HENT:  Head: Normocephalic and atraumatic.  Right Ear: External ear normal.  Left Ear: External ear normal.  Nose: Nose normal.  Mouth/Throat: Oropharynx is clear and moist.  Eyes: Conjunctivae and EOM are normal. Pupils are equal, round, and reactive to light.  Neck: Normal range of motion. Neck supple. No JVD present. No tracheal deviation present. No thyromegaly present.  Cardiovascular: Normal rate, regular rhythm, normal heart sounds and intact distal pulses.  Exam reveals no gallop and no friction rub.   No murmur heard. No carotid nor aortic bruits for full pulses 2+ and symmetrical  Pulmonary/Chest: Effort normal and breath sounds normal. No stridor. No respiratory  distress. He has no wheezes. He has no rales. He exhibits no tenderness.  Abdominal: Soft. Bowel sounds are normal. He exhibits no distension and no mass. There is no tenderness. There is no rebound and no guarding.  Genitourinary: Rectum normal, prostate normal and penis normal. Guaiac negative stool. No penile tenderness.  Musculoskeletal: Normal range of motion. He exhibits no edema or tenderness.  Lymphadenopathy:    He has no cervical adenopathy.  Neurological: He is alert and oriented to person, place, and time. He has normal reflexes. No cranial nerve deficit. He exhibits normal muscle tone.  Skin: Skin is warm and dry. No rash noted. No erythema. No pallor.  Total body skin exam normal  Psychiatric: He has a normal mood and affect. His behavior is normal. Judgment and thought content normal.  Nursing note and vitals reviewed.  bilateral cataracts and bilateral cerumen impactions        Assessment & Plan:   mostly elevated triglycerides................ Continue Lopid  Decreased visual acuity and floaters........... Consult with Dr. Bing Plume  OTC earwax kit.......... Uses directed

## 2015-10-19 DIAGNOSIS — H524 Presbyopia: Secondary | ICD-10-CM | POA: Diagnosis not present

## 2015-10-19 DIAGNOSIS — H25813 Combined forms of age-related cataract, bilateral: Secondary | ICD-10-CM | POA: Diagnosis not present

## 2015-10-19 DIAGNOSIS — H52223 Regular astigmatism, bilateral: Secondary | ICD-10-CM | POA: Diagnosis not present

## 2015-10-19 DIAGNOSIS — H5213 Myopia, bilateral: Secondary | ICD-10-CM | POA: Diagnosis not present

## 2015-10-24 ENCOUNTER — Other Ambulatory Visit: Payer: Medicare HMO

## 2015-10-31 ENCOUNTER — Encounter: Payer: Medicare HMO | Admitting: Family Medicine

## 2016-01-19 DIAGNOSIS — D2262 Melanocytic nevi of left upper limb, including shoulder: Secondary | ICD-10-CM | POA: Diagnosis not present

## 2016-01-19 DIAGNOSIS — L821 Other seborrheic keratosis: Secondary | ICD-10-CM | POA: Diagnosis not present

## 2016-01-19 DIAGNOSIS — L570 Actinic keratosis: Secondary | ICD-10-CM | POA: Diagnosis not present

## 2016-01-19 DIAGNOSIS — L814 Other melanin hyperpigmentation: Secondary | ICD-10-CM | POA: Diagnosis not present

## 2016-01-19 DIAGNOSIS — L111 Transient acantholytic dermatosis [Grover]: Secondary | ICD-10-CM | POA: Diagnosis not present

## 2016-01-19 DIAGNOSIS — Z85828 Personal history of other malignant neoplasm of skin: Secondary | ICD-10-CM | POA: Diagnosis not present

## 2016-02-29 ENCOUNTER — Ambulatory Visit (INDEPENDENT_AMBULATORY_CARE_PROVIDER_SITE_OTHER): Payer: Medicare HMO

## 2016-02-29 DIAGNOSIS — Z23 Encounter for immunization: Secondary | ICD-10-CM

## 2016-09-11 ENCOUNTER — Ambulatory Visit (INDEPENDENT_AMBULATORY_CARE_PROVIDER_SITE_OTHER): Payer: Medicare HMO | Admitting: Adult Health

## 2016-09-11 ENCOUNTER — Encounter: Payer: Self-pay | Admitting: Adult Health

## 2016-09-11 VITALS — BP 128/70 | Temp 98.1°F | Wt 185.3 lb

## 2016-09-11 DIAGNOSIS — K5900 Constipation, unspecified: Secondary | ICD-10-CM | POA: Diagnosis not present

## 2016-09-11 NOTE — Progress Notes (Signed)
Subjective:    Patient ID: Marcus Reynolds, male    DOB: 1945/07/16, 71 y.o.   MRN: [id number]  HPI  71 year old male who  has a past medical history of Personal history of colonic polyps (12/25/2010).  He is a patient of Dr. Sherren Mocha, who I am seeing today for the first time for constipation. This is a chronic issue. He tries to remember to take Miralax  He reports that his last bowel movement was three days ago. He is passing gas. Denies abdominal pain, nausea or vomiting.   He has used Dulcolax for the last day and  Half without relief   Review of Systems See HPI   Past Medical History:  Diagnosis Date  . Personal history of colonic polyps 12/25/2010    Social History   Social History  . Marital status: Married    Spouse name: N/A  . Number of children: 2  . Years of education: N/A   Occupational History  . Retired    Social History Main Topics  . Smoking status: Never Smoker  . Smokeless tobacco: Never Used  . Alcohol use 0.0 oz/week     Comment: socially  . Drug use: No  . Sexual activity: Not on file   Other Topics Concern  . Not on file   Social History Narrative  . No narrative on file    Past Surgical History:  Procedure Laterality Date  . COLONOSCOPY  12/25/2010   internal hemorrhoids  . COLONOSCOPY W/ POLYPECTOMY  2007   [place], [place]     right side  . TONSILECTOMY, ADENOIDECTOMY, BILATERAL MYRINGOTOMY AND TUBES      Family History  Problem Relation Age of Onset  . Heart disease Mother   . Diabetes Mother   . Ulcerative colitis Mother   . Colon cancer Neg Hx   . Esophageal cancer Neg Hx   . Rectal cancer Neg Hx   . Stomach cancer Neg Hx     No Known Allergies  Current Outpatient Prescriptions on File Prior to Visit  Medication Sig Dispense Refill  . aspirin 81 MG tablet Take 81 mg by mouth daily.      Marland Kitchen gemfibrozil (LOPID) 600 MG tablet Take 1 tablet (600 mg total) by mouth 2 (two) times daily before a meal. 200  tablet 3  . Multiple Vitamin (MULTIVITAMIN) tablet Take 1 tablet by mouth daily.       Current Facility-Administered Medications on File Prior to Visit  Medication Dose Route Frequency Provider Last Rate Last Dose  . 0.9 %  sodium chloride infusion  500 mL Intravenous Continuous Gatha Mayer, MD        BP 128/70 (BP Location: Left Arm, Patient Position: Sitting, Cuff Size: Normal)   Temp 98.1 F (36.7 C) (Oral)   Wt 185 lb 4.8 oz (84.1 kg)   BMI 24.45 kg/m       Objective:   Physical Exam  Constitutional: He is oriented to person, place, and time. He appears well-developed and well-nourished. No distress.  Abdominal: Soft. Normal appearance and bowel sounds are normal. He exhibits no distension and no mass. There is no tenderness. There is no rebound and no guarding.  Genitourinary: Rectum normal. Rectal exam shows guaiac negative stool.  Genitourinary Comments: Soft stool felt in vault   Neurological: He is alert and oriented to person, place, and time.  Skin: Skin is warm and dry. No rash noted. He is not diaphoretic.  No erythema. No pallor.  Psychiatric: He has a normal mood and affect. His behavior is normal. Judgment and thought content normal.  Nursing note and vitals reviewed.     Assessment & Plan:  1. Constipation, unspecified constipation type - Advised to try drinking warm prune juice with a table spoon of melted butter and table spoon of olive oil mixed in  - Start probiotic - Can use Enema if needed  Dorothyann Peng, NP

## 2016-10-16 ENCOUNTER — Other Ambulatory Visit (INDEPENDENT_AMBULATORY_CARE_PROVIDER_SITE_OTHER): Payer: Medicare HMO

## 2016-10-16 DIAGNOSIS — E781 Pure hyperglyceridemia: Secondary | ICD-10-CM | POA: Diagnosis not present

## 2016-10-16 DIAGNOSIS — Z0000 Encounter for general adult medical examination without abnormal findings: Secondary | ICD-10-CM

## 2016-10-16 LAB — POC URINALSYSI DIPSTICK (AUTOMATED)
Bilirubin, UA: NEGATIVE
Blood, UA: NEGATIVE
GLUCOSE UA: NEGATIVE
KETONES UA: NEGATIVE
LEUKOCYTES UA: NEGATIVE
Nitrite, UA: NEGATIVE
PROTEIN UA: NEGATIVE
Urobilinogen, UA: 0.2 E.U./dL
pH, UA: 6 (ref 5.0–8.0)

## 2016-10-16 LAB — HEPATIC FUNCTION PANEL
ALT: 13 U/L (ref 0–53)
AST: 21 U/L (ref 0–37)
Albumin: 4.7 g/dL (ref 3.5–5.2)
Alkaline Phosphatase: 99 U/L (ref 39–117)
BILIRUBIN DIRECT: 0.2 mg/dL (ref 0.0–0.3)
BILIRUBIN TOTAL: 0.7 mg/dL (ref 0.2–1.2)
Total Protein: 6.9 g/dL (ref 6.0–8.3)

## 2016-10-16 LAB — CBC WITH DIFFERENTIAL/PLATELET
BASOS ABS: 0.1 10*3/uL (ref 0.0–0.1)
BASOS PCT: 1 % (ref 0.0–3.0)
EOS ABS: 0.1 10*3/uL (ref 0.0–0.7)
Eosinophils Relative: 2.6 % (ref 0.0–5.0)
HCT: 42.5 % (ref 39.0–52.0)
Hemoglobin: 14.4 g/dL (ref 13.0–17.0)
LYMPHS ABS: 1.4 10*3/uL (ref 0.7–4.0)
Lymphocytes Relative: 27.2 % (ref 12.0–46.0)
MCHC: 34 g/dL (ref 30.0–36.0)
MCV: 90 fl (ref 78.0–100.0)
MONO ABS: 0.5 10*3/uL (ref 0.1–1.0)
Monocytes Relative: 9.2 % (ref 3.0–12.0)
NEUTROS ABS: 3.2 10*3/uL (ref 1.4–7.7)
NEUTROS PCT: 60 % (ref 43.0–77.0)
Platelets: 227 10*3/uL (ref 150.0–400.0)
RBC: 4.72 Mil/uL (ref 4.22–5.81)
RDW: 13 % (ref 11.5–15.5)
WBC: 5.3 10*3/uL (ref 4.0–10.5)

## 2016-10-16 LAB — TSH: TSH: 3.14 u[IU]/mL (ref 0.35–4.50)

## 2016-10-16 LAB — BASIC METABOLIC PANEL
BUN: 26 mg/dL — AB (ref 6–23)
CALCIUM: 9.7 mg/dL (ref 8.4–10.5)
CHLORIDE: 103 meq/L (ref 96–112)
CO2: 30 meq/L (ref 19–32)
CREATININE: 1.17 mg/dL (ref 0.40–1.50)
GFR: 65.26 mL/min (ref >60.00)
GLUCOSE: 87 mg/dL (ref 70–99)
Potassium: 4.2 mEq/L (ref 3.5–5.1)
Sodium: 140 mEq/L (ref 135–145)

## 2016-10-16 LAB — LIPID PANEL
Cholesterol: 144 mg/dL (ref 0–200)
HDL: 41.8 mg/dL (ref >39.00)
LDL CALC: 82 mg/dL (ref 0–99)
NonHDL: 101.92
Total CHOL/HDL Ratio: 3
Triglycerides: 101 mg/dL (ref 0.0–149.0)
VLDL: 20.2 mg/dL (ref 0.0–40.0)

## 2016-10-23 ENCOUNTER — Ambulatory Visit (INDEPENDENT_AMBULATORY_CARE_PROVIDER_SITE_OTHER): Payer: Medicare HMO | Admitting: Family Medicine

## 2016-10-23 ENCOUNTER — Encounter: Payer: Self-pay | Admitting: Family Medicine

## 2016-10-23 VITALS — BP 138/78 | Temp 98.3°F | Ht 73.0 in | Wt 180.0 lb

## 2016-10-23 DIAGNOSIS — E781 Pure hyperglyceridemia: Secondary | ICD-10-CM | POA: Diagnosis not present

## 2016-10-23 DIAGNOSIS — Z0001 Encounter for general adult medical examination with abnormal findings: Secondary | ICD-10-CM

## 2016-10-23 DIAGNOSIS — R351 Nocturia: Secondary | ICD-10-CM | POA: Diagnosis not present

## 2016-10-23 DIAGNOSIS — Z136 Encounter for screening for cardiovascular disorders: Secondary | ICD-10-CM

## 2016-10-23 LAB — PSA: PSA: 2.07 ng/mL (ref 0.10–4.00)

## 2016-10-23 MED ORDER — HYDROCODONE-HOMATROPINE 5-1.5 MG/5ML PO SYRP: 5.0000 mL | ORAL_SOLUTION | Freq: Three times a day (TID) | ORAL | 0 refills | Status: DC | PRN

## 2016-10-23 MED ORDER — GEMFIBROZIL 600 MG PO TABS: 600.0000 mg | ORAL_TABLET | Freq: Two times a day (BID) | ORAL | 3 refills | Status: DC

## 2016-10-23 MED ORDER — SILDENAFIL CITRATE 20 MG PO TABS: ORAL_TABLET | ORAL | 11 refills | Status: DC

## 2016-10-23 NOTE — Patient Instructions (Signed)
Continue current medications  Generic Viagra 20 mg..............Marland Kitchen 1-2 tablets 2-3 hours prior to sex.......... the cheapest place we found his [employer]........... 30 tabs for 45 hours  Return in one year for general physical exam sooner if any problems

## 2016-10-23 NOTE — Progress Notes (Signed)
Marcus Reynolds is a delightful 71 year old married male nonsmoker comes in today for physical evaluation because of a history of hyperlipidemia  He takes Lopid 600 mg daily and is done well.  He uses Levitra when necessary for mild ED  He gets routine eye care. Indeed he's due to have an eye exam tomorrow by Dr. Bing Plume. He's having trouble with night vision seeing halos around his lites. It may be that his cataracts are getting more dense.  He gets regular dental.. Colonoscopy 2016 showed a couple polyps he's due to go back in 5 years  Vaccinations up-to-date encouraged to get the new shingles vaccine  14 point review of systems reviewed and otherwise negative  Cognitive function normal he walks 3 miles daily home health safety reviewed no issues identified, no guns in the house, he does have a healthcare power of attorney and living well.  EKG was done because a history of hyperlipidemia. EKG was normal and unchanged  BP 138/78 (BP Location: Left Arm)   Temp 98.3 F (36.8 C) (Oral)   Ht 6\' 1"  (1.854 m)   Wt 180 lb (81.6 kg)   BMI 23.75 kg/m  Examination HEENT was pertinent his cataracts are extremely dense. Neck was supple thyroid not enlarged no carotid bruits cardiopulmonary exam normal abdominal exam normal genitalia normal circumcised male rectum normal stool guaiac-negative prostate normal....... although he does have nocturia 1 or 2............ extremity normal skin normal peripheral pulses normal  #1 healthy male  #2 hyperlipidemia.......... continue Lopid  #3 nocturia........ no medications this time.... Follow-up PSA as outlined  #4 mild ED......Marland Kitchen

## 2016-10-24 DIAGNOSIS — H25813 Combined forms of age-related cataract, bilateral: Secondary | ICD-10-CM | POA: Diagnosis not present

## 2016-10-24 DIAGNOSIS — H524 Presbyopia: Secondary | ICD-10-CM | POA: Diagnosis not present

## 2017-01-24 DIAGNOSIS — C44519 Basal cell carcinoma of skin of other part of trunk: Secondary | ICD-10-CM | POA: Diagnosis not present

## 2017-01-24 DIAGNOSIS — L570 Actinic keratosis: Secondary | ICD-10-CM | POA: Diagnosis not present

## 2017-01-24 DIAGNOSIS — L111 Transient acantholytic dermatosis [Grover]: Secondary | ICD-10-CM | POA: Diagnosis not present

## 2017-01-24 DIAGNOSIS — Z85828 Personal history of other malignant neoplasm of skin: Secondary | ICD-10-CM | POA: Diagnosis not present

## 2017-01-24 DIAGNOSIS — D1801 Hemangioma of skin and subcutaneous tissue: Secondary | ICD-10-CM | POA: Diagnosis not present

## 2017-01-24 DIAGNOSIS — L720 Epidermal cyst: Secondary | ICD-10-CM | POA: Diagnosis not present

## 2017-02-28 ENCOUNTER — Encounter: Payer: Self-pay | Admitting: Family Medicine

## 2017-03-13 ENCOUNTER — Ambulatory Visit (INDEPENDENT_AMBULATORY_CARE_PROVIDER_SITE_OTHER): Payer: Medicare HMO

## 2017-03-13 DIAGNOSIS — Z23 Encounter for immunization: Secondary | ICD-10-CM | POA: Diagnosis not present

## 2017-04-24 DIAGNOSIS — H524 Presbyopia: Secondary | ICD-10-CM | POA: Diagnosis not present

## 2017-04-24 DIAGNOSIS — H25813 Combined forms of age-related cataract, bilateral: Secondary | ICD-10-CM | POA: Diagnosis not present

## 2017-05-09 DIAGNOSIS — H4423 Degenerative myopia, bilateral: Secondary | ICD-10-CM | POA: Diagnosis not present

## 2017-05-09 DIAGNOSIS — H2513 Age-related nuclear cataract, bilateral: Secondary | ICD-10-CM | POA: Diagnosis not present

## 2017-05-09 DIAGNOSIS — H33191 Other retinoschisis and retinal cysts, right eye: Secondary | ICD-10-CM | POA: Diagnosis not present

## 2017-05-09 DIAGNOSIS — H40013 Open angle with borderline findings, low risk, bilateral: Secondary | ICD-10-CM | POA: Diagnosis not present

## 2017-05-09 DIAGNOSIS — H35371 Puckering of macula, right eye: Secondary | ICD-10-CM | POA: Diagnosis not present

## 2017-06-05 ENCOUNTER — Encounter: Payer: Self-pay | Admitting: Adult Health

## 2017-06-05 ENCOUNTER — Ambulatory Visit: Payer: Medicare HMO | Admitting: Adult Health

## 2017-06-05 VITALS — BP 140/80 | Temp 97.8°F | Wt 188.0 lb

## 2017-06-05 DIAGNOSIS — T148XXA Other injury of unspecified body region, initial encounter: Secondary | ICD-10-CM

## 2017-06-05 MED ORDER — METHYLPREDNISOLONE 4 MG PO TBPK: ORAL_TABLET | ORAL | 0 refills | Status: DC

## 2017-06-05 NOTE — Progress Notes (Signed)
Subjective:    Patient ID: Marcus Reynolds, male    DOB: [birth year], 71 y.o.   MRN: [id number]  HPI  71 year old male who  has a past medical history of Personal history of colonic polyps (12/25/2010). He presents to the office today for the acute complaint of  Bilateral leg pain. His pain is located in the hamstrings and thighs. Pain started about 2 weeks ago after he was shoveling snow. He continues to work out twice a week but has found that the pain is worse after he works out. Also noticed pain in his hamstrings when he squats.   He denies any trauma or falls. He has not noticed any bruising. No pain with palpation   Pain is described as an " ache" and "tightness".   Has been using motrin sparingly as he does not like to take medications    Review of Systems See HPI   Past Medical History:  Diagnosis Date  . Personal history of colonic polyps 12/25/2010    Social History   Socioeconomic History  . Marital status: Married    Spouse name: Not on file  . Number of children: 2  . Years of education: Not on file  . Highest education level: Not on file  Social Needs  . Financial resource strain: Not on file  . Food insecurity - worry: Not on file  . Food insecurity - inability: Not on file  . Transportation needs - medical: Not on file  . Transportation needs - non-medical: Not on file  Occupational History  . Occupation: Retired  Tobacco Use  . Smoking status: Never Smoker  . Smokeless tobacco: Never Used  Substance and Sexual Activity  . Alcohol use: Yes    Alcohol/week: 0.0 oz    Comment: socially  . Drug use: No  . Sexual activity: Not on file  Other Topics Concern  . Not on file  Social History Narrative  . Not on file    Past Surgical History:  Procedure Laterality Date  . COLONOSCOPY  12/25/2010   internal hemorrhoids  . COLONOSCOPY W/ POLYPECTOMY  2007   [place], [place]     right side  . TONSILECTOMY, ADENOIDECTOMY, BILATERAL  MYRINGOTOMY AND TUBES      Family History  Problem Relation Age of Onset  . Heart disease Mother   . Diabetes Mother   . Ulcerative colitis Mother   . Colon cancer Neg Hx   . Esophageal cancer Neg Hx   . Rectal cancer Neg Hx   . Stomach cancer Neg Hx     No Known Allergies  Current Outpatient Medications on File Prior to Visit  Medication Sig Dispense Refill  . aspirin 81 MG tablet Take 81 mg by mouth daily.      Marland Kitchen gemfibrozil (LOPID) 600 MG tablet Take 1 tablet (600 mg total) by mouth 2 (two) times daily before a meal. 200 tablet 3  . Multiple Vitamin (MULTIVITAMIN) tablet Take 1 tablet by mouth daily.      . sildenafil (REVATIO) 20 MG tablet Take 1 to 2 tabs 2 - 3 hours before sex 30 tablet 11   Current Facility-Administered Medications on File Prior to Visit  Medication Dose Route Frequency Provider Last Rate Last Dose  . 0.9 %  sodium chloride infusion  500 mL Intravenous Continuous Gatha Mayer, MD        BP 140/80 (BP Location: Left Arm)   Temp 97.8 F (36.6  C) (Oral)   Wt 188 lb (85.3 kg)   BMI 24.80 kg/m       Objective:   Physical Exam  Constitutional: He is oriented to person, place, and time. He appears well-developed and well-nourished.  Cardiovascular: Normal rate, regular rhythm, normal heart sounds and intact distal pulses. Exam reveals no gallop and no friction rub.  No murmur heard. Pulmonary/Chest: Effort normal and breath sounds normal. No respiratory distress. He has no wheezes. He has no rales. He exhibits no tenderness.  Musculoskeletal: Normal range of motion. He exhibits no edema, tenderness or deformity.  Neurological: He is alert and oriented to person, place, and time.  Skin: Skin is warm and dry. No rash noted. He is not diaphoretic. No erythema. No pallor.  Psychiatric: He has a normal mood and affect. His behavior is normal. Judgment and thought content normal.  Nursing note and vitals reviewed.     Assessment & Plan:  1. Muscle  strain - Exam is consistent with muscle strain, likely due to over activity. Advised motrin 600 mg Q6 H PRN and a heating pad. He is also going to try a TENS unit.  - methylPREDNISolone (MEDROL DOSEPAK) 4 MG TBPK tablet; Take as directed  Dispense: 21 tablet; Refill: 0 - Rest  - Stretching exercises in acouple of days   Dorothyann Peng, NP

## 2017-06-24 ENCOUNTER — Encounter: Payer: Self-pay | Admitting: Adult Health

## 2017-06-24 ENCOUNTER — Other Ambulatory Visit: Payer: Self-pay | Admitting: Adult Health

## 2017-06-24 DIAGNOSIS — T148XXA Other injury of unspecified body region, initial encounter: Secondary | ICD-10-CM

## 2017-06-26 ENCOUNTER — Encounter: Payer: Self-pay | Admitting: Physical Therapy

## 2017-06-26 ENCOUNTER — Other Ambulatory Visit: Payer: Self-pay

## 2017-06-26 ENCOUNTER — Ambulatory Visit: Payer: Medicare HMO | Attending: Adult Health | Admitting: Physical Therapy

## 2017-06-26 DIAGNOSIS — R262 Difficulty in walking, not elsewhere classified: Secondary | ICD-10-CM | POA: Diagnosis not present

## 2017-06-26 DIAGNOSIS — M6281 Muscle weakness (generalized): Secondary | ICD-10-CM

## 2017-06-26 DIAGNOSIS — M79652 Pain in left thigh: Secondary | ICD-10-CM

## 2017-06-26 DIAGNOSIS — M25652 Stiffness of left hip, not elsewhere classified: Secondary | ICD-10-CM | POA: Diagnosis not present

## 2017-06-26 DIAGNOSIS — M79651 Pain in right thigh: Secondary | ICD-10-CM | POA: Diagnosis not present

## 2017-06-26 NOTE — Therapy (Signed)
[facility] [address], [place], [place], [address] Phone: [phone]   Fax:  [phone]  Physical Therapy Evaluation  Patient Details  Name: Marcus Reynolds MRN: [id number] Date of Birth: 24-Nov-1945 Referring Provider: Dorothyann Peng   Encounter Date: 06/26/2017  PT End of Session - 06/26/17 1630    Visit Number  1    Date for PT Re-Evaluation  08/21/17    Authorization Type  Medicare    PT Start Time  1520    PT Stop Time  8527    PT Time Calculation (min)  55 min    Activity Tolerance  Patient tolerated treatment well       Past Medical History:  Diagnosis Date  . Personal history of colonic polyps 12/25/2010    Past Surgical History:  Procedure Laterality Date  . COLONOSCOPY  12/25/2010   internal hemorrhoids  . COLONOSCOPY W/ POLYPECTOMY  2007   [place], [place]     right side  . TONSILECTOMY, ADENOIDECTOMY, BILATERAL MYRINGOTOMY AND TUBES      There were no vitals filed for this visit.   Subjective Assessment - 06/26/17 1523    Subjective  Unsure of cause:  could be from shoveling snow in mid Dec and got on rowing machine at [employer] (could have been too much weight).  Resulted in pressure in buttocks, back of thighs and a little in quads.  Left worse than right.  Been trying to do bridges and stretches but made it hurt.      Limitations  Sitting;House hold activities;Walking    How long can you sit comfortably?  padded=as long as I want;  less in hard chair    How long can you walk comfortably?  was walking 3 miles a day (last tried 1 mile about 2 weeks ago and worsened)    Diagnostic tests  none    Patient Stated Goals  get back to walking;  prepare for trip to [place]    Currently in Pain?  Yes    Pain Location  Hip    Pain Orientation  Right;Left    Pain Type  Acute pain    Pain Onset  1 to 4 weeks ago    Pain Frequency  Intermittent    Aggravating Factors    sitting on hard chair; mornings    Pain Relieving Factors  ibuprofen;  ACE bandage on left thigh         [facility] PT Assessment - 06/26/17 0001      Assessment   Medical Diagnosis  muscle strain    Referring Provider  Dorothyann Peng    Onset Date/Surgical Date  -- mid Dec 5 weeks    Hand Dominance  Left    Next MD Visit  as needed    Prior Therapy  none      Precautions   Precautions  None      Restrictions   Weight Bearing Restrictions  No      Balance Screen   Has the patient fallen in the past 6 months  No    Has the patient had a decrease in activity level because of a fear of falling?   No    Is the patient reluctant to leave their home because of a fear of falling?   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)      Leisure  walk, hike, fix computers      Observation/Other Assessments   Focus on Therapeutic Outcomes (FOTO)   52% limitation      Posture/Postural Control   Posture Comments  3/4 squat with ace bandage on ;  SLS right 8 sec, left 3 sec and shaky      AROM   Right Hip Extension  8    Left Hip Extension  3    Lumbar Flexion  30    Lumbar Extension  10 pain    Lumbar - Right Side Bend  30    Lumbar - Left Side Bend  30      Strength   Right Hip Flexion  5/5    Right Hip Extension  4/5    Right Hip ABduction  4/5    Left Hip Flexion  5/5    Left Hip Extension  4-/5    Left Hip ABduction  4-/5    Right Knee Flexion  5/5    Right Knee Extension  5/5    Left Knee Flexion  5/5    Left Knee Extension  5/5    Lumbar Flexion  4/5 decreased activation of transverse abdominus      Flexibility   Hamstrings  right 70, left 60     Quadriceps  decreased right hip flexor 10 degrees, left 5 degrees      Palpation   Palpation comment  tender points bil prox HS, left gluteals       Slump test   Findings  Negative      Prone Knee Bend Test   Findings  Negative      Trendelenburg Test   Findings  Positive    Side  Left    Comments  with SLS      Thomas Test    Findings  Positive    Comments  left > right      Hip Scouring   Findings  Negative             Objective measurements completed on examination: See above findings.              PT Education - 06/26/17 1610    Education provided  Yes    Education Details  supine HS flossing;  supine hip flexor stretch;  return to walk 5 min increments; basic dry needling info    Person(s) Educated  Patient    Methods  Demonstration;Explanation;Handout    Comprehension  Verbalized understanding;Returned demonstration       PT Short Term Goals - 06/26/17 1639      PT SHORT TERM GOAL #1   Title  The patient will demonstrate understanding of basic self care strategies, return to walking program and inital HEP    Time  4    Period  Weeks    Status  New    Target Date  07/24/17      PT SHORT TERM GOAL #2   Title  The patient will report a 40% improvement in pain intensity with sitting, squatting and walking     Time  4  Period  Weeks    Status  New      PT SHORT TERM GOAL #3   Title  Lumbar flexion to 50 degrees and bil HS length to 75 degrees to improve stride length with walking    Time  4    Period  Weeks    Status  New      PT SHORT TERM GOAL #4   Title  The patient will be able to walk 1 mile with min intensity pain     Time  4    Period  Weeks    Status  New        PT Long Term Goals - 06/26/17 1642      PT LONG TERM GOAL #1   Title  The patient will be independent in safe self progression of HEP    Time  8    Period  Weeks    Status  New    Target Date  08/21/17      PT LONG TERM GOAL #2   Title  The patient will be  able to walk 2 miles needed  to prepare for trip to [place] in April    Time  8    Period  Weeks    Status  New      PT LONG TERM GOAL #3   Title  The patient will report overall pain level with sitting, squatting and walking improved by 60%    Time  8    Period  Weeks    Status  New      PT LONG TERM GOAL #4   Title  The patient will have improved bil hip strength to 4+/5 needed 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  He is unsure if this is the result of shoveling snow or using too much weight on the rowing machine at the gym.  He reports the pain is present with sitting in a hard chair,in the mornings squatting and with walking longer distances.  He is limited with standing forward flexion to 30 degrees.  He is able to squat 3/4 of the way day which he states is better today than usual.  Pelvic drop and instability noted with single leg stand on the left.  Decreased bilateral HS and hip flexor muscle lengths.  Weakness with bil hip extension and abduction (left weaker than right).  Tender points in proximal medial and lateral HS muscles as well as left gluteals.  He would benefit from PT to address these deficits.      History and Personal Factors relevant to plan of care:  no co-morbidities;  previous healthy lifestyle    Clinical Presentation  Stable    Clinical Decision Making  Low    Rehab Potential  Good    Clinical Impairments Affecting Rehab  Potential  none    PT Frequency  2x / week    PT Duration  8 weeks    PT  Treatment/Interventions  Electrical Stimulation;Cryotherapy;Ultrasound;Moist Heat;Iontophoresis 4mg /ml Dexamethasone;Therapeutic activities;Therapeutic exercise;Patient/family education;Neuromuscular re-education;Manual techniques;Taping;Dry needling    PT Next Visit Plan  review HS and hip flexor stretches  (progress as tolerated)  and walking program;  add clams in sidelying;  KTC stretch;  piriformis stretch; manual to bil HS and left gluteals, patient considering DN;  possible U/S     PT Home Exercise Plan  patient has used home TENs;  ELOS 11 visits    Consulted and Agree with Plan of Care  Patient       Patient will benefit from skilled therapeutic intervention in order to improve the following deficits and impairments:  Pain, Increased fascial restricitons, Increased muscle spasms, Decreased activity tolerance, Decreased strength, Decreased range of motion, Difficulty walking  Visit Diagnosis: Pain in left thigh - Plan: PT plan of care cert/re-cert  Pain in right thigh - Plan: PT plan of care cert/re-cert  Muscle weakness (generalized) - Plan: PT plan of care cert/re-cert  Difficulty in walking, not elsewhere classified - Plan: PT plan of care cert/re-cert  Stiffness of left hip, not elsewhere classified - Plan: PT plan of care cert/re-cert     Problem List Patient Active Problem List   Diagnosis Date Noted  . Routine general medical examination at a health care facility 09/23/2013  . ED (erectile dysfunction) 07/23/2011  . History of colonic polyps 12/25/2010  . HYPERTRIGLYCERIDEMIA 06/13/2009   [name], PT 06/26/17 4:51 PM Phone: [phone] Fax: [phone]  [name] 06/26/2017, 4:51 PM  [facility] [address] [place], [place], [address] Phone: [phone]   Fax:  [phone]  Name: Marcus Reynolds MRN: [id number] Date of Birth: Mar 23, 1946

## 2017-06-26 NOTE — Patient Instructions (Signed)
    Hamstring Stretch   With other leg bent, foot flat, grasp right leg and slowly try to straighten knee. Hold _0___ seconds. Repeat _10___ times. Do __2__ sessions per day.  http://[email or web address].[email or web address]    Hip Flexor Stretch   Lying on back near edge of bed, bend one leg, foot flat. Hang other leg over edge, relaxed, thigh resting entirely on bed for ___30 sec Repeat _3___ times. Do __1-2__ sessions per day. Advanced Exercise: Bend knee back keeping thigh in contact with bed.  http://[email or web address].exer.us/346     Stacy's rules:  No more than a "5/10" pain level                           Pain should could go away within a few minutes of the activity    Short bouts of walking 5 min 2-3x/day             [name] PT [facility] [address] [place], [place] [address] Phone # [phone] Fax [phone]

## 2017-07-01 ENCOUNTER — Ambulatory Visit: Payer: Medicare HMO | Admitting: Physical Therapy

## 2017-07-01 DIAGNOSIS — R262 Difficulty in walking, not elsewhere classified: Secondary | ICD-10-CM

## 2017-07-01 DIAGNOSIS — M79652 Pain in left thigh: Secondary | ICD-10-CM

## 2017-07-01 DIAGNOSIS — M79651 Pain in right thigh: Secondary | ICD-10-CM

## 2017-07-01 DIAGNOSIS — M25652 Stiffness of left hip, not elsewhere classified: Secondary | ICD-10-CM

## 2017-07-01 DIAGNOSIS — M6281 Muscle weakness (generalized): Secondary | ICD-10-CM

## 2017-07-01 NOTE — Therapy (Signed)
[facility] [address], [place], [place], [address] Phone: [phone]   Fax:  [phone]  Physical Therapy Treatment  Patient Details  Name: Marcus Reynolds MRN: [id number] Date of Birth: December 08, 1945 Referring Provider: Dorothyann Peng   Encounter Date: 07/01/2017  PT End of Session - 07/01/17 1656    Visit Number  2    Date for PT Re-Evaluation  08/21/17    Authorization Type  Medicare    PT Start Time  2951    PT Stop Time  1658 dry needling, moist heat    PT Time Calculation (min)  53 min       Past Medical History:  Diagnosis Date  . Personal history of colonic polyps 12/25/2010    Past Surgical History:  Procedure Laterality Date  . COLONOSCOPY  12/25/2010   internal hemorrhoids  . COLONOSCOPY W/ POLYPECTOMY  2007   [place], [place]     right side  . TONSILECTOMY, ADENOIDECTOMY, BILATERAL MYRINGOTOMY AND TUBES      There were no vitals filed for this visit.  Subjective Assessment - 07/01/17 1607    Subjective  Sore and stiff in the morning, then loosens up and stiffness late in day.   Did ok with hip flexor stretch but with knee flexion too much pull.  Did 5 and 10 min walk and   (Pended)     Currently in Pain?  No/denies    Pain Score  0-No pain    Pain Orientation  Right;Left  (Pended)  left > right buttock    Aggravating Factors   mornings                       [facility] Adult PT Treatment/Exercise - 07/01/17 0001      Knee/Hip Exercises: Aerobic   Nustep  L1 10 min       Knee/Hip Exercises: Standing   Other Standing Knee Exercises  on 2nd step on/off knee flexion/extension      Knee/Hip Exercises: Supine   Other Supine Knee/Hip Exercises  review of previous HEP    Other Supine Knee/Hip Exercises  discussion of walking program      Moist Heat Therapy   Number Minutes Moist Heat  10 Minutes    Moist Heat Location  -- bil HS in long sitting position      Manual Therapy   Manual therapy comments  piriformis soft tissue work manual     Soft tissue mobilization  Instrument assisted soft tissue to bil HS       Trigger Point Dry Needling - 07/01/17 1715    Consent Given?  Yes    Education Handout Provided  Yes    Muscles Treated Lower Body  Piriformis;Hamstring    Piriformis Response  Twitch response elicited;Palpable increased muscle length    Hamstring Response  Twitch response elicited;Palpable increased muscle length        Performed bil   PT Education - 07/01/17 1656    Education provided  Yes    Education Details  dry needling after care;  stair step dynamic HS    Person(s) Educated  Patient    Methods  Explanation;Handout    Comprehension  Verbalized understanding;Returned demonstration       PT Short Term Goals - 06/26/17 1639      PT SHORT TERM GOAL #1   Title  The patient will demonstrate understanding of basic self care strategies, return  to walking program and inital HEP    Time  4    Period  Weeks    Status  New    Target Date  07/24/17      PT SHORT TERM GOAL #2   Title  The patient will report a 40% improvement in pain intensity with sitting, squatting and walking     Time  4    Period  Weeks    Status  New      PT SHORT TERM GOAL #3   Title  Lumbar flexion to 50 degrees and bil HS length to 75 degrees to improve stride length with walking    Time  4    Period  Weeks    Status  New      PT SHORT TERM GOAL #4   Title  The patient will be able to walk 1 mile with min intensity pain     Time  4    Period  Weeks    Status  New        PT Long Term Goals - 06/26/17 1642      PT LONG TERM GOAL #1   Title  The patient will be independent in safe self progression of HEP    Time  8    Period  Weeks    Status  New    Target Date  08/21/17      PT LONG TERM GOAL #2   Title  The patient will be able to walk 2 miles needed  to prepare for trip to [place] in April    Time  8    Period  Weeks     Status  New      PT LONG TERM GOAL #3   Title  The patient will report overall pain level with sitting, squatting and walking improved by 60%    Time  8    Period  Weeks    Status  New      PT LONG TERM GOAL #4   Title  The patient will have improved bil hip strength to 4+/5 needed 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  Improved soft tissue length following dry needling and manual therapy.  Forward flexion pre-treatment finger tips to mid patella, post treatment finger tips to below tibial tubercles.      Rehab Potential  Good    Clinical Impairments Affecting Rehab Potential  none    PT Frequency  2x / week    PT Duration  8 weeks    PT Next Visit Plan  assess response to DN #1;  instrument assisted soft tissue mob;  review HS and hip flexor stretches  (progress as tolerated)  and walking program;  add clams in sidelying;  KTC stretch;  piriformis stretch; manual to bil HS and  left gluteals;  possible U/S        Patient will benefit from skilled therapeutic intervention in order to improve the following deficits and impairments:  Pain, Increased fascial restricitons, Increased muscle spasms, Decreased activity tolerance, Decreased strength, Decreased range of motion, Difficulty walking  Visit Diagnosis: Pain in left thigh  Pain in right thigh  Muscle weakness (generalized)  Difficulty in walking, not elsewhere classified  Stiffness of left hip, not elsewhere classified     Problem List Patient Active Problem  List   Diagnosis Date Noted  . Routine general medical examination at a health care facility 09/23/2013  . ED (erectile dysfunction) 07/23/2011  . History of colonic polyps 12/25/2010  . HYPERTRIGLYCERIDEMIA 06/13/2009    [name], PT 07/01/17 5:19 PM Phone: [phone] Fax: [phone]  [name] 07/01/2017, 5:19 PM  [facility] [address] [place], [place], [address] Phone: [phone]   Fax:  [phone]  Name: Marcus Reynolds MRN: [id number] Date of Birth: 1945-08-11

## 2017-07-01 NOTE — Patient Instructions (Addendum)
     Trigger Point Dry Needling  . What is Trigger Point Dry Needling (DN)? o DN is a physical therapy technique used to treat muscle pain and dysfunction. Specifically, DN helps deactivate muscle trigger points (muscle knots).  o A thin filiform needle is used to penetrate the skin and stimulate the underlying trigger point. The goal is for a local twitch response (LTR) to occur and for the trigger point to relax. No medication of any kind is injected during the procedure.   . What Does Trigger Point Dry Needling Feel Like?  o The procedure feels different for each individual patient. Some patients report that they do not actually feel the needle enter the skin and overall the process is not painful. Very mild bleeding may occur. However, many patients feel a deep cramping in the muscle in which the needle was inserted. This is the local twitch response.   . How Will I feel after the treatment? o Soreness is normal, and the onset of soreness may not occur for a few hours. Typically this soreness does not last longer than two days.  o Bruising is uncommon, however; ice can be used to decrease any possible bruising.  o In rare cases feeling tired or nauseous after the treatment is normal. In addition, your symptoms may get worse before they get better, this period will typically not last longer than 24 hours.   . What Can I do After My Treatment? o Increase your hydration by drinking more water for the next 24 hours. o You may place ice or heat on the areas treated that have become sore, however, do not use heat on inflamed or bruised areas. Heat often brings more relief post needling. o You can continue your regular activities, but vigorous activity is not recommended initially after the treatment for 24 hours. o DN is best combined with other physical therapy such as strengthening, stretching, and other therapies.    Murial Beam PT [facility] [address], [address] , [place] [address] Phone # [phone] Fax [phone] 

## 2017-07-04 ENCOUNTER — Ambulatory Visit: Payer: Medicare HMO | Admitting: Physical Therapy

## 2017-07-04 DIAGNOSIS — M79651 Pain in right thigh: Secondary | ICD-10-CM | POA: Diagnosis not present

## 2017-07-04 DIAGNOSIS — M79652 Pain in left thigh: Secondary | ICD-10-CM | POA: Diagnosis not present

## 2017-07-04 DIAGNOSIS — M25652 Stiffness of left hip, not elsewhere classified: Secondary | ICD-10-CM

## 2017-07-04 DIAGNOSIS — R262 Difficulty in walking, not elsewhere classified: Secondary | ICD-10-CM

## 2017-07-04 DIAGNOSIS — M6281 Muscle weakness (generalized): Secondary | ICD-10-CM

## 2017-07-04 NOTE — Therapy (Signed)
[facility] [address], [place], [place], [address] Phone: [phone]   Fax:  [phone]  Physical Therapy Treatment  Patient Details  Name: Marcus Reynolds MRN: [id number] Date of Birth: 02/11/1946 Referring Provider: Dorothyann Peng   Encounter Date: 07/04/2017  PT End of Session - 07/04/17 1014    Visit Number  3  (Pended)     Date for PT Re-Evaluation  08/21/17  (Pended)     Authorization Type  Medicare  (Pended)     PT Start Time  0930  (Pended)     PT Stop Time  1016  (Pended)     PT Time Calculation (min)  46 min  (Pended)     Activity Tolerance  Patient tolerated treatment well  (Pended)        Past Medical History:  Diagnosis Date  . Personal history of colonic polyps 12/25/2010    Past Surgical History:  Procedure Laterality Date  . COLONOSCOPY  12/25/2010   internal hemorrhoids  . COLONOSCOPY W/ POLYPECTOMY  2007   [place], [place]     right side  . TONSILECTOMY, ADENOIDECTOMY, BILATERAL MYRINGOTOMY AND TUBES      There were no vitals filed for this visit.  Subjective Assessment - 07/04/17 0933    Subjective  Mornings are tough.  Some soreness on Tuesday.  Wednesday better.  Thursday a little worse but better today.  Overall 10% better.  Stair stretch really gave me some aches for 20-30 min.  Heat helped a lot.  I could really feel it when I went walking in [employer].      Currently in Pain?  No/denies    Pain Location  Hip upper HS bil    Pain Orientation  Right;Left    Pain Type  Acute pain    Aggravating Factors   mornings    Pain Relieving Factors  ACE bandage left thigh                      [facility] Adult PT Treatment/Exercise - 07/04/17 0001      Knee/Hip Exercises: Aerobic   Nustep  L1 10 min  While discussing response to treatment     Knee/Hip Exercises: Standing   Other Standing Knee Exercises  on 2nd step on/off knee flexion/extension      Knee/Hip  Exercises: Supine   Other Supine Knee/Hip Exercises  green ball flexion/ext rolls 10x;  HS sets on green ball 10x    Other Supine Knee/Hip Exercises  lumbar rotation on green ball 10x;  piriformis stretch 3x      Knee/Hip Exercises: Sidelying   Clams  10x bil      Manual Therapy   Soft tissue mobilization  Instrument assisted soft tissue to bil HS gluteals, calf              PT Education - 07/04/17 1013    Education provided  Yes    Education Details  clam sidelying    Person(s) Educated  Patient    Methods  Explanation;Demonstration;Handout    Comprehension  Verbalized understanding;Returned demonstration       PT Short Term Goals - 06/26/17 1639      PT SHORT TERM GOAL #1   Title  The patient will demonstrate understanding of basic self care strategies, return to walking program and inital HEP    Time  4    Period  Weeks    Status  New    Target Date  07/24/17      PT SHORT TERM GOAL #2   Title  The patient will report a 40% improvement in pain intensity with sitting, squatting and walking     Time  4    Period  Weeks    Status  New      PT SHORT TERM GOAL #3   Title  Lumbar flexion to 50 degrees and bil HS length to 75 degrees to improve stride length with walking    Time  4    Period  Weeks    Status  New      PT SHORT TERM GOAL #4   Title  The patient will be able to walk 1 mile with min intensity pain     Time  4    Period  Weeks    Status  New        PT Long Term Goals - 06/26/17 1642      PT LONG TERM GOAL #1   Title  The patient will be independent in safe self progression of HEP    Time  8    Period  Weeks    Status  New    Target Date  08/21/17      PT LONG TERM GOAL #2   Title  The patient will be able to walk 2 miles needed  to prepare for trip to [place] in April    Time  8    Period  Weeks    Status  New      PT LONG TERM GOAL #3   Title  The patient will report overall pain level with sitting, squatting and walking  improved by 60%    Time  8    Period  Weeks    Status  New      PT LONG TERM GOAL #4   Title  The patient will have improved bil hip strength to 4+/5 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  Tender points near HS attachment on ischial tubersosities.  Able to perform low level HS mobility and isometrics with minimal pain.      Rehab Potential  Good    Clinical Impairments Affecting Rehab Potential  none    PT Frequency  2x / week    PT Duration  8 weeks    PT Treatment/Interventions  Electrical Stimulation;Cryotherapy;Ultrasound;Moist Heat;Iontophoresis 4mg /ml Dexamethasone;Therapeutic activities;Therapeutic exercise;Patient/family education;Neuromuscular re-education;Manual techniques;Taping;Dry needling    PT Next Visit Plan  DN #2;  instrument assisted soft tissue mob;    review clams in sidelying;  KTC stretch;  piriformis stretch; manual to bil HS and left gluteals;  possible U/S or ES;  try bridge over ball;  kinesiotape       Patient will benefit from skilled therapeutic intervention in order to improve the following deficits and impairments:  Pain, Increased fascial  restricitons, Increased muscle spasms, Decreased activity tolerance, Decreased strength, Decreased range of motion, Difficulty walking  Visit Diagnosis: Pain in left thigh  Pain in right thigh  Muscle weakness (generalized)  Difficulty in walking, not elsewhere classified  Stiffness of left hip, not elsewhere classified     Problem List Patient Active Problem List   Diagnosis Date Noted  . Routine  general medical examination at a health care facility 09/23/2013  . ED (erectile dysfunction) 07/23/2011  . History of colonic polyps 12/25/2010  . HYPERTRIGLYCERIDEMIA 06/13/2009   [name], PT 07/04/17 12:19 PM Phone: [phone] Fax: [phone]  [name] 07/04/2017, 12:18 PM  [facility] [address], [place], [place], [address] Phone: [phone]   Fax:  [phone]  Name: Marcus Reynolds MRN: [id number] Date of Birth: [birth year]

## 2017-07-04 NOTE — Patient Instructions (Signed)
    Abduction: Clam (Eccentric) - Side-Lying   Lie on side with knees bent. Lift top knee, keeping feet together. Keep trunk steady. Slowly lower for 3-5 seconds. _10__ reps per set, __1_ sets per day, ___ days per week. Add resistive band when you achieve _25__ repetitions.7  Copyright  VHI. All rights reserved.    [name] PT [facility] [address], [place], [place] [address] Phone # [phone] Fax [phone]

## 2017-07-08 ENCOUNTER — Ambulatory Visit: Payer: Medicare HMO | Admitting: Physical Therapy

## 2017-07-08 ENCOUNTER — Encounter: Payer: Self-pay | Admitting: Physical Therapy

## 2017-07-08 DIAGNOSIS — M6281 Muscle weakness (generalized): Secondary | ICD-10-CM | POA: Diagnosis not present

## 2017-07-08 DIAGNOSIS — M79652 Pain in left thigh: Secondary | ICD-10-CM | POA: Diagnosis not present

## 2017-07-08 DIAGNOSIS — R262 Difficulty in walking, not elsewhere classified: Secondary | ICD-10-CM

## 2017-07-08 DIAGNOSIS — M79651 Pain in right thigh: Secondary | ICD-10-CM | POA: Diagnosis not present

## 2017-07-08 DIAGNOSIS — M25652 Stiffness of left hip, not elsewhere classified: Secondary | ICD-10-CM

## 2017-07-08 NOTE — Therapy (Signed)
[facility] [address] [place], [place], [address] Phone: [phone]   Fax:  [phone]  Physical Therapy Treatment  Patient Details  Name: Marcus Reynolds MRN: [id number] Date of Birth: 12-Mar-1946 Referring Provider: Dorothyann Peng   Encounter Date: 07/08/2017  PT End of Session - 07/08/17 1700    Visit Number  3    Date for PT Re-Evaluation  08/21/17    Authorization Type  Medicare    PT Start Time  1615    PT Stop Time  1705    PT Time Calculation (min)  50 min    Activity Tolerance  Patient tolerated treatment well       Past Medical History:  Diagnosis Date  . Personal history of colonic polyps 12/25/2010    Past Surgical History:  Procedure Laterality Date  . COLONOSCOPY  12/25/2010   internal hemorrhoids  . COLONOSCOPY W/ POLYPECTOMY  2007   [place], [place]     right side  . TONSILECTOMY, ADENOIDECTOMY, BILATERAL MYRINGOTOMY AND TUBES      There were no vitals filed for this visit.  Subjective Assessment - 07/08/17 1616    Subjective  I'm doing Ok today but I had a bad day yesterday.  I think I maybe stretched too much.  I thought the clam was too easy so I started lifting the whole leg and did lots of reps.   Then I went for a walk and talked to some neighbors for a while.  So I hurt last evening.  Better today.  Taking an ibuprofen in the mornings.      Currently in Pain?  Yes    Pain Score  1     Pain Location  Hip bil upper posterior thighs    Pain Orientation  Right;Left    Pain Type  Chronic pain    Aggravating Factors   twisting to dry myself off after the shower.                        [facility] Adult PT Treatment/Exercise - 07/08/17 0001      Knee/Hip Exercises: Aerobic   Stationary Bike  Bike 6 min no resistance      Knee/Hip Exercises: Standing   Other Standing Knee Exercises  on 2nd step on/off knee flexion/extension 5x right/left      Knee/Hip  Exercises: Supine   Bridges  AROM;Both;5 reps with green ball     Other Supine Knee/Hip Exercises  green ball flexion/ext rolls 10x;  HS sets on green ball 10x    Other Supine Knee/Hip Exercises  lumbar rotation on green ball 10x;  piriformis stretch 5x      Knee/Hip Exercises: Sidelying   Clams  10x with green band      Moist Heat Therapy   Number Minutes Moist Heat  5 Minutes    Moist Heat Location  -- bil HS supine with big wedge      Manual Therapy   Manual therapy comments  piriformis soft tissue work manual        Trigger Point Dry Needling - 07/08/17 1700    Consent Given?  Yes    Muscles Treated Lower Body  -- sidelying    Piriformis Response  Twitch response elicited;Palpable increased muscle length    Hamstring Response  Twitch response elicited;Palpable increased muscle length near ischial tuberosity        Performed bilaterally.  PT Short Term Goals - 06/26/17 1639      PT SHORT TERM GOAL #1   Title  The patient will demonstrate understanding of basic self care strategies, return to walking program and inital HEP    Time  4    Period  Weeks    Status  New    Target Date  07/24/17      PT SHORT TERM GOAL #2   Title  The patient will report a 40% improvement in pain intensity with sitting, squatting and walking     Time  4    Period  Weeks    Status  New      PT SHORT TERM GOAL #3   Title  Lumbar flexion to 50 degrees and bil HS length to 75 degrees to improve stride length with walking    Time  4    Period  Weeks    Status  New      PT SHORT TERM GOAL #4   Title  The patient will be able to walk 1 mile with min intensity pain     Time  4    Period  Weeks    Status  New        PT Long Term Goals - 06/26/17 1642      PT LONG TERM GOAL #1   Title  The patient will be independent in safe self progression of HEP    Time  8    Period  Weeks    Status  New    Target Date  08/21/17      PT LONG TERM GOAL #2   Title  The patient will be  able to walk 2 miles needed  to prepare for trip to [place] in April    Time  8    Period  Weeks    Status  New      PT LONG TERM GOAL #3   Title  The patient will report overall pain level with sitting, squatting and walking improved by 60%    Time  8    Period  Weeks    Status  New      PT LONG TERM GOAL #4   Title  The patient will have improved bil hip strength to 4+/5 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  Improving tolerance of gentle hamstring mobility and low level strengthening.  Patient does tend to "overdo it" with more reps or altering the exercises to "make them harder" resulting in an exacerbation of symptoms over the weekend.  Discussed graduated return of activity and walking program.      Rehab Potential  Good    Clinical Impairments Affecting Rehab Potential  none    PT Frequency  2x / week    PT Duration  8 weeks  PT Treatment/Interventions  Electrical Stimulation;Cryotherapy;Ultrasound;Moist Heat;Iontophoresis 4mg /ml Dexamethasone;Therapeutic activities;Therapeutic exercise;Patient/family education;Neuromuscular re-education;Manual techniques;Taping;Dry needling    PT Next Visit Plan  assess response to DN #2;  instrument assisted soft tissue mob;    review clams in sidelying with band;  KTC stretch;  piriformis stretch; manual to bil HS and left gluteals;  possible U/S or ES if needed for pain control;  bridge over ball;  kinesiotape       Patient will benefit from skilled therapeutic  intervention in order to improve the following deficits and impairments:  Pain, Increased fascial restricitons, Increased muscle spasms, Decreased activity tolerance, Decreased strength, Decreased range of motion, Difficulty walking  Visit Diagnosis: Pain in left thigh  Pain in right thigh  Muscle weakness (generalized)  Difficulty in walking, not elsewhere classified  Stiffness of left hip, not elsewhere classified     Problem List Patient Active Problem List   Diagnosis Date Noted  . Routine general medical examination at a health care facility 09/23/2013  . ED (erectile dysfunction) 07/23/2011  . History of colonic polyps 12/25/2010  . HYPERTRIGLYCERIDEMIA 06/13/2009   [name], PT 07/08/17 5:11 PM Phone: [phone] Fax: [phone]  [name] 07/08/2017, 5:10 PM  [facility] [address] [place], [place], [address] Phone: [phone]   Fax:  [phone]  Name: Marcus Reynolds MRN: [id number] Date of Birth: 20-Jan-1946

## 2017-07-11 ENCOUNTER — Encounter: Payer: Self-pay | Admitting: Physical Therapy

## 2017-07-11 ENCOUNTER — Ambulatory Visit: Payer: Medicare HMO | Attending: Adult Health | Admitting: Physical Therapy

## 2017-07-11 DIAGNOSIS — R262 Difficulty in walking, not elsewhere classified: Secondary | ICD-10-CM

## 2017-07-11 DIAGNOSIS — M25652 Stiffness of left hip, not elsewhere classified: Secondary | ICD-10-CM | POA: Insufficient documentation

## 2017-07-11 DIAGNOSIS — M6281 Muscle weakness (generalized): Secondary | ICD-10-CM | POA: Diagnosis present

## 2017-07-11 DIAGNOSIS — M79652 Pain in left thigh: Secondary | ICD-10-CM

## 2017-07-11 DIAGNOSIS — M79651 Pain in right thigh: Secondary | ICD-10-CM | POA: Insufficient documentation

## 2017-07-11 NOTE — Therapy (Signed)
[facility] [address] [place], [place], [address] Phone: [phone]   Fax:  [phone]  Physical Therapy Treatment  Patient Details  Name: Marcus Reynolds MRN: [id number] Date of Birth: 27-Jan-1946 Referring Provider: Dorothyann Peng   Encounter Date: 07/11/2017  PT End of Session - 07/11/17 1101    Visit Number  4    Date for PT Re-Evaluation  08/21/17    Authorization Type  Medicare    PT Start Time  6160    PT Stop Time  1110 dry needling    PT Time Calculation (min)  55 min    Activity Tolerance  Patient tolerated treatment well       Past Medical History:  Diagnosis Date  . Personal history of colonic polyps 12/25/2010    Past Surgical History:  Procedure Laterality Date  . COLONOSCOPY  12/25/2010   internal hemorrhoids  . COLONOSCOPY W/ POLYPECTOMY  2007   [place], [place]     right side  . TONSILECTOMY, ADENOIDECTOMY, BILATERAL MYRINGOTOMY AND TUBES      There were no vitals filed for this visit.  Subjective Assessment - 07/11/17 1019    Subjective  I think DN the gluteals really made an impression.  I don't feel that now.  I feel the mid and lower HS at night and cramping and during the day with walking on uneven surfaces.  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      Currently in Pain?  Yes    Pain Score  1     Pain Location  Leg    Pain Orientation  Right;Left    Aggravating Factors   morning;  uneven surface, sitting    Pain Relieving Factors  sitting on pillow                      [facility] Adult PT Treatment/Exercise - 07/11/17 0001      Therapeutic Activites    Therapeutic Activities  ADL's    ADL's  walking, standing, reaching, turning      Knee/Hip Exercises: Aerobic   Stationary Bike  Bike 7 min L2      Knee/Hip Exercises: Standing   Heel Raises  Both;10 reps    SLS with Vectors  4 ways 5x each leg    Other Standing Knee Exercises  high stepping 5  laps    Other Standing Knee Exercises  sidestepping 5 laps      Knee/Hip Exercises: Supine   Bridges  AROM;Both;5 reps with green ball     Other Supine Knee/Hip Exercises  green ball flexion/ext rolls 10x;  HS sets on green ball 10x    Other Supine Knee/Hip Exercises  lumbar rotation on green ball 10x;  piriformis stretch 5x      Moist Heat Therapy   Number Minutes Moist Heat  15 Minutes    Moist Heat Location  -- bil HS      Electrical Stimulation   Electrical Stimulation Location  bil HS    Electrical Stimulation Action  IFC     Electrical Stimulation Parameters  9 ma 15 min    Electrical Stimulation Goals  Pain      Manual Therapy   Soft tissue mobilization  HS bil soft tissue mobilization        Trigger Point Dry Needling - 07/11/17 1100    Consent Given?  Yes    Hamstring Response  Twitch response elicited;Palpable  increased muscle length             PT Short Term Goals - 06/26/17 1639      PT SHORT TERM GOAL #1   Title  The patient will demonstrate understanding of basic self care strategies, return to walking program and inital HEP    Time  4    Period  Weeks    Status  New    Target Date  07/24/17      PT SHORT TERM GOAL #2   Title  The patient will report a 40% improvement in pain intensity with sitting, squatting and walking     Time  4    Period  Weeks    Status  New      PT SHORT TERM GOAL #3   Title  Lumbar flexion to 50 degrees and bil HS length to 75 degrees to improve stride length with walking    Time  4    Period  Weeks    Status  New      PT SHORT TERM GOAL #4   Title  The patient will be able to walk 1 mile with min intensity pain     Time  4    Period  Weeks    Status  New        PT Long Term Goals - 06/26/17 1642      PT LONG TERM GOAL #1   Title  The patient will be independent in safe self progression of HEP    Time  8    Period  Weeks    Status  New    Target Date  08/21/17      PT LONG TERM GOAL #2   Title  The  patient will be able to walk 2 miles needed  to prepare for trip to [place] in April    Time  8    Period  Weeks    Status  New      PT LONG TERM GOAL #3   Title  The patient will report overall pain level with sitting, squatting and walking improved by 60%    Time  8    Period  Weeks    Status  New      PT LONG TERM GOAL #4   Title  The patient will have improved bil hip strength to 4+/5 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  Improved muscle length following manual therapy and DN.  Able to progress exercise to include low level dynamic exercises but mild discomfort is present throughout.  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      Rehab Potential  Good    Clinical Impairments Affecting Rehab Potential  none    PT Frequency  2x / week  PT Duration  8 weeks    PT Treatment/Interventions  Electrical Stimulation;Cryotherapy;Ultrasound;Moist Heat;Iontophoresis 4mg /ml Dexamethasone;Therapeutic activities;Therapeutic exercise;Patient/family education;Neuromuscular re-education;Manual techniques;Taping;Dry needling    PT Next Visit Plan  assess response to DN #3 to mid and distal HS;  instrument assisted soft tissue mob;    review clams in sidelying with band; weight shift on unstable surface;   KTC stretch;  piriformis stretch; manual to bil HS and left gluteals;  U/S or ES if needed for pain control;  bridge over ball;  kinesiotape       Patient will benefit from skilled therapeutic intervention in order to  improve the following deficits and impairments:  Pain, Increased fascial restricitons, Increased muscle spasms, Decreased activity tolerance, Decreased strength, Decreased range of motion, Difficulty walking  Visit Diagnosis: Pain in left thigh  Pain in right thigh  Muscle weakness (generalized)  Difficulty in walking, not elsewhere classified  Stiffness of left hip, not elsewhere classified     Problem List Patient Active Problem List   Diagnosis Date Noted  . Routine general medical examination at a health care facility 09/23/2013  . ED (erectile dysfunction) 07/23/2011  . History of colonic polyps 12/25/2010  . HYPERTRIGLYCERIDEMIA 06/13/2009   [name], PT 07/11/17 12:02 PM Phone: [phone] Fax: [phone]  [name] 07/11/2017, 12:01 PM  [facility] [address] [place], [place], [address] Phone: [phone]   Fax:  [phone]  Name: Marcus Reynolds MRN: [id number] Date of Birth: [birth year]

## 2017-07-15 ENCOUNTER — Ambulatory Visit: Payer: Medicare HMO | Admitting: Physical Therapy

## 2017-07-15 DIAGNOSIS — M79652 Pain in left thigh: Secondary | ICD-10-CM

## 2017-07-15 DIAGNOSIS — R262 Difficulty in walking, not elsewhere classified: Secondary | ICD-10-CM | POA: Diagnosis not present

## 2017-07-15 DIAGNOSIS — M25652 Stiffness of left hip, not elsewhere classified: Secondary | ICD-10-CM

## 2017-07-15 DIAGNOSIS — M6281 Muscle weakness (generalized): Secondary | ICD-10-CM

## 2017-07-15 DIAGNOSIS — M79651 Pain in right thigh: Secondary | ICD-10-CM

## 2017-07-15 NOTE — Therapy (Signed)
[facility] [address], [place], [place], [address] Phone: [phone]   Fax:  [phone]  Physical Therapy Treatment  Patient Details  Name: Marcus Reynolds MRN: [id number] Date of Birth: 12-17-1945 Referring Provider: Dorothyann Peng   Encounter Date: 07/15/2017  PT End of Session - 07/15/17 1108    Visit Number  5    Date for PT Re-Evaluation  08/21/17    Authorization Type  Medicare    PT Start Time  1017    PT Stop Time  1102    PT Time Calculation (min)  45 min    Activity Tolerance  Patient tolerated treatment well       Past Medical History:  Diagnosis Date  . Personal history of colonic polyps 12/25/2010    Past Surgical History:  Procedure Laterality Date  . COLONOSCOPY  12/25/2010   internal hemorrhoids  . COLONOSCOPY W/ POLYPECTOMY  2007   [place], [place]     right side  . TONSILECTOMY, ADENOIDECTOMY, BILATERAL MYRINGOTOMY AND TUBES      There were no vitals filed for this visit.  Subjective Assessment - 07/15/17 1019    Subjective  Walked 30 min yesterday and it tightened up afterwards.  I still use heat.  I do some stretches.  Patient asks if his recovery is as expected.  I was sore that day from DN.  Sunday I felt pretty good.      How long can you walk comfortably?  was walking 3 miles a day (last tried 1 mile about 2 weeks ago and worsened)    Patient Stated Goals  get back to walking;  prepare for trip to [place]    Currently in Pain?  Yes    Pain Score  2     Pain Location  Hip    Pain Type  Acute pain    Aggravating Factors   morning and evening stiffness;  edge of chair hits the back of my upper legs         [facility] PT Assessment - 07/15/17 0001      AROM   Lumbar Flexion  70    Lumbar Extension  25      Flexibility   Hamstrings  supine passive right 45, left 40                  [facility] Adult PT Treatment/Exercise - 07/15/17 0001      Therapeutic Activites    ADL's  walking, standing, reaching, turning      Neuro Re-ed    Neuro Re-ed Details   physiology of healing      Knee/Hip Exercises: Stretches   Hip Flexor Stretch  -- psoas doorway 3x5 bil      Knee/Hip Exercises: Aerobic   Nustep  L2 10 min       Knee/Hip Exercises: Machines for Strengthening   Total Gym Leg Press  St9  bil 50#; single leg 30# 15x each        Knee/Hip Exercises: Standing   Rocker Board  2 minutes    Rebounder  --    Other Standing Knee Exercises  walk climbs 10x      Knee/Hip Exercises: Seated   Long Arc Quad Limitations  red band 10x right/left    Knee/Hip Flexion  red band 10x hip flexion red band                PT Short  Term Goals - 07/15/17 1118      PT SHORT TERM GOAL #1   Title  The patient will demonstrate understanding of basic self care strategies, return to walking program and inital HEP    Status  Achieved      PT SHORT TERM GOAL #2   Title  The patient will report a 40% improvement in pain intensity with sitting, squatting and walking     Time  4    Period  Weeks    Status  On-going      PT SHORT TERM GOAL #3   Title  Lumbar flexion to 50 degrees and bil HS length to 75 degrees to improve stride length with walking    Time  4    Period  Weeks    Status  On-going      PT SHORT TERM GOAL #4   Title  The patient will be able to walk 1 mile with min intensity pain     Status  Achieved        PT Long Term Goals - 06/26/17 1642      PT LONG TERM GOAL #1   Title  The patient will be independent in safe self progression of HEP    Time  8    Period  Weeks    Status  New    Target Date  08/21/17      PT LONG TERM GOAL #2   Title  The patient will be able to walk 2 miles needed  to prepare for trip to [place] in April    Time  8    Period  Weeks    Status  New      PT LONG TERM GOAL #3   Title  The patient will report overall pain level with sitting, squatting and walking improved by 60%     Time  8    Period  Weeks    Status  New      PT LONG TERM GOAL #4   Title  The patient will have improved bil hip strength to 4+/5 needed for 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  His pain level increases to a 3/10 with activity and dissipates quickly.  Good improvement with forward flexion and extension ROM.   Progressing with STGs.     Rehab Potential  Good    Clinical Impairments Affecting Rehab Potential  none    PT Frequency  2x / week    PT Duration  8 weeks    PT Treatment/Interventions  Electrical Stimulation;Cryotherapy;Ultrasound;Moist Heat;Iontophoresis 4mg /ml Dexamethasone;Therapeutic activities;Therapeutic exercise;Patient/family education;Neuromuscular re-education;Manual techniques;Taping;Dry needling    PT Next Visit Plan   DN as needed to mid and distal HS/ gluteals, piriformis;  instrument assisted soft tissue mob;    review clams in sidelying with band; weight shift  on unstable surface; rebounder  KTC stretch;  piriformis stretch; manual to bil HS and left gluteals;  U/S or ES if needed for pain control;  bridge over ball;  kinesiotape    Recommended Other Services  certification has been signed        Patient will benefit from skilled therapeutic intervention in order to improve the following deficits and impairments:  Pain, Increased fascial restricitons, Increased muscle spasms, Decreased activity tolerance, Decreased strength, Decreased range of motion, Difficulty walking  Visit Diagnosis: Pain in left  thigh  Pain in right thigh  Muscle weakness (generalized)  Difficulty in walking, not elsewhere classified  Stiffness of left hip, not elsewhere classified     Problem List Patient Active Problem List   Diagnosis Date Noted  . Routine general medical examination at a health care facility 09/23/2013  . ED (erectile dysfunction) 07/23/2011  . History of colonic polyps 12/25/2010  . HYPERTRIGLYCERIDEMIA 06/13/2009  [name], PT 07/15/17 11:21 AM Phone: [phone] Fax: [phone]  [name] 07/15/2017, 11:21 AM  [facility] [address], [place], [place], [address] Phone: [phone]   Fax:  [phone]  Name: Marcus Reynolds MRN: [id number] Date of Birth: 01-Jun-1946

## 2017-07-17 ENCOUNTER — Encounter: Payer: Medicare HMO | Admitting: Physical Therapy

## 2017-07-18 ENCOUNTER — Ambulatory Visit: Payer: Medicare HMO | Admitting: Physical Therapy

## 2017-07-18 ENCOUNTER — Encounter: Payer: Self-pay | Admitting: Physical Therapy

## 2017-07-18 DIAGNOSIS — M79652 Pain in left thigh: Secondary | ICD-10-CM

## 2017-07-18 DIAGNOSIS — M79651 Pain in right thigh: Secondary | ICD-10-CM

## 2017-07-18 DIAGNOSIS — M6281 Muscle weakness (generalized): Secondary | ICD-10-CM

## 2017-07-18 DIAGNOSIS — R262 Difficulty in walking, not elsewhere classified: Secondary | ICD-10-CM

## 2017-07-18 DIAGNOSIS — M25652 Stiffness of left hip, not elsewhere classified: Secondary | ICD-10-CM | POA: Diagnosis not present

## 2017-07-18 NOTE — Therapy (Signed)
[facility] [address] [place], [place], [address] Phone: [phone]   Fax:  [phone]  Physical Therapy Treatment  Patient Details  Name: Marcus Reynolds MRN: [id number] Date of Birth: 12-14-1945 Referring Provider: Dorothyann Peng   Encounter Date: 07/18/2017  PT End of Session - 07/18/17 1143    Visit Number  6    Date for PT Re-Evaluation  08/21/17    Authorization Type  Medicare    PT Start Time  1102    PT Stop Time  1143    PT Time Calculation (min)  41 min    Activity Tolerance  Patient tolerated treatment well       Past Medical History:  Diagnosis Date  . Personal history of colonic polyps 12/25/2010    Past Surgical History:  Procedure Laterality Date  . COLONOSCOPY  12/25/2010   internal hemorrhoids  . COLONOSCOPY W/ POLYPECTOMY  2007   [place], [place]     right side  . TONSILECTOMY, ADENOIDECTOMY, BILATERAL MYRINGOTOMY AND TUBES      There were no vitals filed for this visit.  Subjective Assessment - 07/18/17 1108    Subjective  I think I've turned a corner finally.  This is the best week I've had.  Able to sit easier.  60-80% better. Stiff in the mornings.  Able to stand to teach Tues/Thurs.     Currently in Pain?  Yes    Pain Score  1     Pain Location  Leg    Pain Orientation  Right;Left                      [facility] Adult PT Treatment/Exercise - 07/18/17 0001      Therapeutic Activites    ADL's  walking, standing, reaching, turning      Neuro Re-ed    Neuro Re-ed Details   gluteal, quad, Hs activation; dynamic balance      Knee/Hip Exercises: Stretches   Active Hamstring Stretch  Right;Left;5 reps seated with leg extended    Other Knee/Hip Stretches  psoas doorway stretch 3x 5      Knee/Hip Exercises: Aerobic   Stationary Bike  Bike 6 min no resistance      Knee/Hip Exercises: Machines for Strengthening   Total Gym Leg Press  St9  bil 50#;  single leg 30# 15x each        Knee/Hip Exercises: Standing   Hip Abduction  Stengthening;Right;Left;15 reps    Abduction Limitations  yellow band     Hip Extension  Stengthening;Right;Left;15 reps    Extension Limitations  yellow band    Rebounder  3 min 3 ways      Knee/Hip Exercises: Seated   Long Arc Quad Limitations  red band 15x right/left    Knee/Hip Flexion  red band 15x hip flexion red band                PT Short Term Goals - 07/18/17 1152      PT SHORT TERM GOAL #1   Title  The patient will demonstrate understanding of basic self care strategies, return to walking program and inital HEP    Status  Achieved      PT SHORT TERM GOAL #2   Title  The patient will report a 40% improvement in pain intensity with sitting, squatting and walking     Status  Achieved      PT SHORT  TERM GOAL #3   Title  Lumbar flexion to 50 degrees and bil HS length to 75 degrees to improve stride length with walking    Status  Partially Met      PT SHORT TERM GOAL #4   Title  The patient will be able to walk 1 mile with min intensity pain     Status  Achieved        PT Long Term Goals - 06/26/17 1642      PT LONG TERM GOAL #1   Title  The patient will be independent in safe self progression of HEP    Time  8    Period  Weeks    Status  New    Target Date  08/21/17      PT LONG TERM GOAL #2   Title  The patient will be able to walk 2 miles needed  to prepare for trip to [place] in April    Time  8    Period  Weeks    Status  New      PT LONG TERM GOAL #3   Title  The patient will report overall pain level with sitting, squatting and walking improved by 60%    Time  8    Period  Weeks    Status  New      PT LONG TERM GOAL #4   Title  The patient will have improved bil hip strength to 4+/5 needed 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  He reports morning stiffness but overall 60-80% better.  Improving bil HS muscle length.  Left LE weaker than right.  Able to perform a progression of intensity with exercises without an exacerbation of pain.  Majority of STGS met.     Rehab Potential  Good    Clinical Impairments Affecting Rehab Potential  none    PT Frequency  2x / week    PT Duration  8 weeks    PT Treatment/Interventions  Electrical Stimulation;Cryotherapy;Ultrasound;Moist Heat;Iontophoresis 55m/ml Dexamethasone;Therapeutic activities;Therapeutic exercise;Patient/family education;Neuromuscular re-education;Manual techniques;Taping;Dry needling    PT Next Visit Plan  recheck ROM next week;  continue with progressive bil LE stretching, strengthening and return to function       Patient will benefit from skilled therapeutic intervention in order to improve the following deficits and impairments:  Pain, Increased fascial restricitons, Increased muscle spasms, Decreased activity tolerance, Decreased strength, Decreased range of motion, Difficulty walking  Visit Diagnosis: Pain in left thigh  Pain in right thigh  Muscle weakness (generalized)  Difficulty in walking, not elsewhere classified  Stiffness of left hip, not elsewhere classified     Problem List Patient Active Problem List   Diagnosis Date Noted  . Routine general medical examination at a health care facility 09/23/2013  . ED (erectile dysfunction) 07/23/2011  . History of colonic polyps 12/25/2010  . HYPERTRIGLYCERIDEMIA  06/13/2009   [name], PT 07/18/17 11:55 AM Phone: [phone] Fax: [phone]  [name] 07/18/2017, 11:54 AM  [facility] [address], [place], [place], [address] Phone: [phone]   Fax:  [phone]  Name: Marcus Reynolds MRN: [id number] Date of Birth: 1945/08/26

## 2017-07-22 ENCOUNTER — Encounter: Payer: Self-pay | Admitting: Physical Therapy

## 2017-07-22 ENCOUNTER — Ambulatory Visit: Payer: Medicare HMO | Admitting: Physical Therapy

## 2017-07-22 DIAGNOSIS — R262 Difficulty in walking, not elsewhere classified: Secondary | ICD-10-CM | POA: Diagnosis not present

## 2017-07-22 DIAGNOSIS — M79652 Pain in left thigh: Secondary | ICD-10-CM | POA: Diagnosis not present

## 2017-07-22 DIAGNOSIS — M6281 Muscle weakness (generalized): Secondary | ICD-10-CM

## 2017-07-22 DIAGNOSIS — M79651 Pain in right thigh: Secondary | ICD-10-CM

## 2017-07-22 DIAGNOSIS — M25652 Stiffness of left hip, not elsewhere classified: Secondary | ICD-10-CM

## 2017-07-22 NOTE — Therapy (Addendum)
[facility] [address], [place], [place], [address] Phone: [phone]   Fax:  [phone]  Physical Therapy Treatment  Patient Details  Name: Marcus Reynolds MRN: [id number] Date of Birth: 11-08-1945 Referring Provider: Dorothyann Peng   Encounter Date: 07/22/2017  PT End of Session - 07/22/17 1050    Visit Number  7    Date for PT Re-Evaluation  08/21/17    Authorization Type  Medicare    PT Start Time  1016    PT Stop Time  1100    PT Time Calculation (min)  44 min    Activity Tolerance  Patient tolerated treatment well       Past Medical History:  Diagnosis Date  . Personal history of colonic polyps 12/25/2010    Past Surgical History:  Procedure Laterality Date  . COLONOSCOPY  12/25/2010   internal hemorrhoids  . COLONOSCOPY W/ POLYPECTOMY  2007   [place], [place]     right side  . TONSILECTOMY, ADENOIDECTOMY, BILATERAL MYRINGOTOMY AND TUBES      There were no vitals filed for this visit.  Subjective Assessment - 07/22/17 1020    Subjective  "I didn't knock on enough wood.  I had a set back on Saturday after walking 20 minutes and I had to hobble home."  "I'm discouraged b/c I was doing so well."  "I've backed off my exercises a bit."      Currently in Pain?  Yes    Pain Score  5     Pain Location  Leg proximal posterior thighs near ischial tuberosity     Pain Orientation  Right;Left    Pain Type  Acute pain    Aggravating Factors   sitting with the back of the chair hitting that part of my thighs    Pain Relieving Factors  sitting on a pillow                      [facility] Adult PT Treatment/Exercise - 07/22/17 0001      Therapeutic Activites    ADL's  walking, standing, reaching, turning      Neuro Re-ed    Neuro Re-ed Details   gluteal, quad, Hs activation; dynamic balance      Knee/Hip Exercises: Stretches   Passive Hamstring Stretch  Right;Left;5 reps on  2nd step    Piriformis Stretch  Right;Left with green ball    Other Knee/Hip Stretches  green ball rolls 5x       Knee/Hip Exercises: Aerobic   Stationary Bike  Bike 6 min no resistance      Knee/Hip Exercises: Machines for Strengthening   Total Gym Leg Press  St9  bil 50#; single leg 30# 15x each        Knee/Hip Exercises: Standing   Rebounder  3 min 3 ways    Other Standing Knee Exercises  leg swings 30 sec each leg      Knee/Hip Exercises: Supine   Bridges with Clamshell  Strengthening;Right;Left without the bridge; double and single 3x10    Other Supine Knee/Hip Exercises  whole leg press down 5x 5 sec holds bil    Other Supine Knee/Hip Exercises  ball squeeze in hooklying 10x                PT Short Term Goals - 07/22/17 1109      PT SHORT TERM GOAL #1   Title  The patient  will demonstrate understanding of basic self care strategies, return to walking program and inital HEP    Status  Achieved      PT SHORT TERM GOAL #2   Title  The patient will report a 40% improvement in pain intensity with sitting, squatting and walking     Status  Achieved      PT SHORT TERM GOAL #3   Title  Lumbar flexion to 50 degrees and bil HS length to 75 degrees to improve stride length with walking    Time  4    Period  Weeks    Status  Partially Met      PT SHORT TERM GOAL #4   Title  The patient will be able to walk 1 mile with min intensity pain     Status  Achieved        PT Long Term Goals - 06/26/17 1642      PT LONG TERM GOAL #1   Title  The patient will be independent in safe self progression of HEP    Time  8    Period  Weeks    Status  New    Target Date  08/21/17      PT LONG TERM GOAL #2   Title  The patient will be able to walk 2 miles needed  to prepare for trip to [place] in April    Time  8    Period  Weeks    Status  New      PT LONG TERM GOAL #3   Title  The patient will report overall pain level with sitting, squatting and walking improved  by 60%    Time  8    Period  Weeks    Status  New      PT LONG TERM GOAL #4   Title  The patient will have improved bil hip strength to 4+/5 needed 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  He complains of  Morning pain in particular in prox HS regions bilaterally.  He would benefit from additional DN and we agreed that if symptoms had not improved by next visit will DN again.  Therapist closely monitoring response to exercise and modifying as needed.     Rehab Potential  Good    Clinical Impairments Affecting Rehab Potential  none    PT Frequency  2x / week    PT Duration  8 weeks    PT Treatment/Interventions  Electrical Stimulation;Cryotherapy;Ultrasound;Moist Heat;Iontophoresis 54m/ml Dexamethasone;Therapeutic activities;Therapeutic exercise;Patient/family education;Neuromuscular re-education;Manual techniques;Taping;Dry needling    PT Next Visit Plan  recheck ROM;  possible DN;   continue with progressive bil LE stretching, strengthening and return to function;  Check for cert signature      Patient will benefit from skilled therapeutic intervention in order to improve the following deficits and impairments:  Pain, Increased fascial restricitons,  Increased muscle spasms, Decreased activity tolerance, Decreased strength, Decreased range of motion, Difficulty walking  Visit Diagnosis: Pain in left thigh  Pain in right thigh  Muscle weakness (generalized)  Difficulty in walking, not elsewhere classified  Stiffness of left hip, not elsewhere  classified     Problem List Patient Active Problem List   Diagnosis Date Noted  . Routine general medical examination at a health care facility 09/23/2013  . ED (erectile dysfunction) 07/23/2011  . History of colonic polyps 12/25/2010  . HYPERTRIGLYCERIDEMIA 06/13/2009    [name], PT 07/22/17 11:10 AM Phone: [phone] Fax: [phone]  [name] 07/22/2017, 11:10 AM  [facility] [address], [place], [place], [address] Phone: [phone]   Fax:  [phone]  Name: Marcus Reynolds MRN: [id number] Date of Birth: 28-Feb-1946

## 2017-07-24 ENCOUNTER — Encounter: Payer: Medicare HMO | Admitting: Physical Therapy

## 2017-07-25 ENCOUNTER — Encounter: Payer: Self-pay | Admitting: Physical Therapy

## 2017-07-25 ENCOUNTER — Ambulatory Visit: Payer: Medicare HMO | Admitting: Physical Therapy

## 2017-07-25 DIAGNOSIS — R262 Difficulty in walking, not elsewhere classified: Secondary | ICD-10-CM

## 2017-07-25 DIAGNOSIS — M79652 Pain in left thigh: Secondary | ICD-10-CM | POA: Diagnosis not present

## 2017-07-25 DIAGNOSIS — M79651 Pain in right thigh: Secondary | ICD-10-CM | POA: Diagnosis not present

## 2017-07-25 DIAGNOSIS — M25652 Stiffness of left hip, not elsewhere classified: Secondary | ICD-10-CM | POA: Diagnosis not present

## 2017-07-25 DIAGNOSIS — M6281 Muscle weakness (generalized): Secondary | ICD-10-CM

## 2017-07-25 NOTE — Patient Instructions (Signed)

## 2017-07-25 NOTE — Therapy (Signed)
[facility] [address] [place], [place], [address] Phone: [phone]   Fax:  [phone]  Physical Therapy Treatment  Patient Details  Name: Marcus Reynolds MRN: [id number] Date of Birth: 1945-06-14 Referring Provider: Dorothyann Peng   Encounter Date: 07/25/2017  PT End of Session - 07/25/17 1152    Visit Number  8    Date for PT Re-Evaluation  08/21/17    Authorization Type  Medicare    PT Start Time  1055    PT Stop Time  1145    PT Time Calculation (min)  50 min    Activity Tolerance  Patient tolerated treatment well       Past Medical History:  Diagnosis Date  . Personal history of colonic polyps 12/25/2010    Past Surgical History:  Procedure Laterality Date  . COLONOSCOPY  12/25/2010   internal hemorrhoids  . COLONOSCOPY W/ POLYPECTOMY  2007   [place], [place]     right side  . TONSILECTOMY, ADENOIDECTOMY, BILATERAL MYRINGOTOMY AND TUBES      There were no vitals filed for this visit.  Subjective Assessment - 07/25/17 1058    Subjective  Mornings are rough.  This week is not as good as last week.  I need to work on that ischial tuberosity area.  I think I might go back to the doctor for a cortisone injection.  I spread pine needles yesterday.      Currently in Pain?  Yes    Pain Score  3  earlier this morning 8/10    Pain Location  Leg    Pain Orientation  Right;Left    Pain Type  Acute pain    Aggravating Factors   late night; early morning    Pain Relieving Factors  Advil; heating pad                      [facility] Adult PT Treatment/Exercise - 07/25/17 0001      Knee/Hip Exercises: Stretches   Other Knee/Hip Stretches  review of HEP      Knee/Hip Exercises: Aerobic   Stationary Bike  Bike 8 min no resistance discussing response to treatment       Iontophoresis   Type of Iontophoresis  Dexamethasone    Location  bil proximal HS    Dose  4 mg/ml    Time   4-6 hour patch      Manual Therapy   Soft tissue mobilization  piriformis, prox HS       Trigger Point Dry Needling - 07/25/17 1149    Consent Given?  Yes    Piriformis Response  Twitch response elicited;Palpable increased muscle length    Hamstring Response  Twitch response elicited;Palpable increased muscle length       Performed bilaterally.    PT Education - 07/25/17 1150    Education provided  Yes    Education Details  iontophoresis education    Person(s) Educated  Patient    Methods  Explanation;Demonstration;Handout    Comprehension  Returned demonstration;Verbalized understanding       PT Short Term Goals - 07/22/17 1109      PT SHORT TERM GOAL #1   Title  The patient will demonstrate understanding of basic self care strategies, return to walking program and inital HEP    Status  Achieved      PT SHORT TERM GOAL #2   Title  The patient will  report a 40% improvement in pain intensity with sitting, squatting and walking     Status  Achieved      PT SHORT TERM GOAL #3   Title  Lumbar flexion to 50 degrees and bil HS length to 75 degrees to improve stride length with walking    Time  4    Period  Weeks    Status  Partially Met      PT SHORT TERM GOAL #4   Title  The patient will be able to walk 1 mile with min intensity pain     Status  Achieved        PT Long Term Goals - 06/26/17 1642      PT LONG TERM GOAL #1   Title  The patient will be independent in safe self progression of HEP    Time  8    Period  Weeks    Status  New    Target Date  08/21/17      PT LONG TERM GOAL #2   Title  The patient will be able to walk 2 miles needed  to prepare for trip to [place] in April    Time  8    Period  Weeks    Status  New      PT LONG TERM GOAL #3   Title  The patient will report overall pain level with sitting, squatting and walking improved by 60%    Time  8    Period  Weeks    Status  New      PT LONG TERM GOAL #4   Title  The patient will  have improved bil hip strength to 4+/5 needed for 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  Numerous tender points in left > right HS proximal near ischial tuberosity and in piriformis muscles.  Improved muscle length following manual therapy and DN.  Initiated iontophoresis patch for pain control.  Therapist closely monitoring response to all.  Patient requests to decrease frequency to 1x/week.      Rehab Potential  Good    PT Frequency  2x / week    PT Duration  8 weeks    PT Treatment/Interventions  Electrical Stimulation;Cryotherapy;Ultrasound;Moist Heat;Iontophoresis 76m/ml Dexamethasone;Therapeutic activities;Therapeutic exercise;Patient/family education;Neuromuscular re-education;Manual techniques;Taping;Dry needling    PT Next Visit Plan  assess response to ionto #1;  assess response to DN;  review HEP;  kinesiotape       Patient will benefit from skilled therapeutic intervention in order to improve the following deficits and impairments:  Pain, Increased fascial restricitons, Increased muscle spasms, Decreased activity tolerance, Decreased strength, Decreased range of motion, Difficulty walking  Visit Diagnosis: Pain in left thigh  Pain in right thigh  Muscle weakness (generalized)  Difficulty in walking, not elsewhere classified  Stiffness of left hip, not elsewhere classified     Problem List Patient  Active Problem List   Diagnosis Date Noted  . Routine general medical examination at a health care facility 09/23/2013  . ED (erectile  dysfunction) 07/23/2011  . History of colonic polyps 12/25/2010  . HYPERTRIGLYCERIDEMIA 06/13/2009   [name], PT 07/25/17 11:59 AM Phone: [phone] Fax: [phone]  [name] 07/25/2017, 11:58 AM  [facility] [address], [place], [place], [address] Phone: [phone]   Fax:  [phone]  Name: Brodyn Depuy MRN: [id number] Date of Birth: 22-May-1946

## 2017-07-28 ENCOUNTER — Encounter: Payer: Self-pay | Admitting: Adult Health

## 2017-07-29 ENCOUNTER — Ambulatory Visit (INDEPENDENT_AMBULATORY_CARE_PROVIDER_SITE_OTHER): Payer: Medicare HMO | Admitting: Adult Health

## 2017-07-29 ENCOUNTER — Encounter: Payer: Self-pay | Admitting: Adult Health

## 2017-07-29 VITALS — BP 146/88 | Temp 98.0°F | Wt 189.0 lb

## 2017-07-29 DIAGNOSIS — S76309D Unspecified injury of muscle, fascia and tendon of the posterior muscle group at thigh level, unspecified thigh, subsequent encounter: Secondary | ICD-10-CM

## 2017-07-29 MED ORDER — METHYLPREDNISOLONE ACETATE 80 MG/ML IJ SUSP
80.0000 mg | Freq: Once | INTRAMUSCULAR | Status: AC
  Administered 2017-07-29: 80 mg via INTRAMUSCULAR

## 2017-07-29 NOTE — Progress Notes (Signed)
Subjective:    Patient ID: Marcus Reynolds, male    DOB: 06/13/1945, 72 y.o.   MRN: [id number]  HPI  72 year old male who  has a past medical history of Personal history of colonic polyps (12/25/2010).  He presents to the office today for follow up regarding pain in his buttocks. I first saw him for this pain in December, after he presented for injury after shoveling snow. He was prescribed a medrol dose pak and was sent to PT, which he continues to do. Per patient " it has been like a roller coaster, I will get better and then I go back to being in pain. At PT he has been doing TENS, stretching exercises and dry needling. At home he has been doing stretching exercising and using a heating pad. Reports temporary relief with these modalities.  Review of Systems See HPI   Past Medical History:  Diagnosis Date  . Personal history of colonic polyps 12/25/2010    Social History   Socioeconomic History  . Marital status: Married    Spouse name: Not on file  . Number of children: 2  . Years of education: Not on file  . Highest education level: Not on file  Social Needs  . Financial resource strain: Not on file  . Food insecurity - worry: Not on file  . Food insecurity - inability: Not on file  . Transportation needs - medical: Not on file  . Transportation needs - non-medical: Not on file  Occupational History  . Occupation: Retired  Tobacco Use  . Smoking status: Never Smoker  . Smokeless tobacco: Never Used  Substance and Sexual Activity  . Alcohol use: Yes    Alcohol/week: 0.0 oz    Comment: socially  . Drug use: No  . Sexual activity: Not on file  Other Topics Concern  . Not on file  Social History Narrative  . Not on file    Past Surgical History:  Procedure Laterality Date  . COLONOSCOPY  12/25/2010   internal hemorrhoids  . COLONOSCOPY W/ POLYPECTOMY  2007   [place], [place]     right side  . TONSILECTOMY, ADENOIDECTOMY, BILATERAL MYRINGOTOMY  AND TUBES      Family History  Problem Relation Age of Onset  . Heart disease Mother   . Diabetes Mother   . Ulcerative colitis Mother   . Colon cancer Neg Hx   . Esophageal cancer Neg Hx   . Rectal cancer Neg Hx   . Stomach cancer Neg Hx     No Known Allergies  Current Outpatient Medications on File Prior to Visit  Medication Sig Dispense Refill  . aspirin 81 MG tablet Take 81 mg by mouth daily.      Marland Kitchen gemfibrozil (LOPID) 600 MG tablet Take 1 tablet (600 mg total) by mouth 2 (two) times daily before a meal. 200 tablet 3  . Multiple Vitamin (MULTIVITAMIN) tablet Take 1 tablet by mouth daily.      . sildenafil (REVATIO) 20 MG tablet Take 1 to 2 tabs 2 - 3 hours before sex 30 tablet 11   No current facility-administered medications on file prior to visit.     BP (!) 146/88 (BP Location: Left Arm)   Temp 98 F (36.7 C) (Oral)   Wt 189 lb (85.7 kg)   BMI 24.94 kg/m       Objective:   Physical Exam  Constitutional: He is oriented to person, place, and time.  He appears well-developed and well-nourished. No distress.  Cardiovascular: Normal rate, regular rhythm, normal heart sounds and intact distal pulses. Exam reveals no gallop and no friction rub.  No murmur heard. Pulmonary/Chest: Effort normal and breath sounds normal. No respiratory distress. He has no wheezes. He exhibits no tenderness.  Musculoskeletal: He exhibits tenderness.  Multiple tender points in left > right HS proximal near ischial tuberosity and in piriformis muscles.   Neurological: He is alert and oriented to person, place, and time.  Skin: Skin is warm and dry. No rash noted. He is not diaphoretic. No erythema. No pallor.  Psychiatric: He has a normal mood and affect. His behavior is normal. Thought content normal.  Nursing note and vitals reviewed.     Assessment & Plan:  1. Hamstring injury, unspecified laterality, subsequent encounter - Will sent to sports Medicine for further evaluation and give  Depo Medrol injection  - methylPREDNISolone acetate (DEPO-MEDROL) injection 80 mg - Ambulatory referral to Sports Medicine - continue with PT   Dorothyann Peng, NP

## 2017-07-31 ENCOUNTER — Encounter: Payer: Self-pay | Admitting: Physical Therapy

## 2017-07-31 ENCOUNTER — Ambulatory Visit: Payer: Medicare HMO | Admitting: Physical Therapy

## 2017-07-31 DIAGNOSIS — M6281 Muscle weakness (generalized): Secondary | ICD-10-CM | POA: Diagnosis not present

## 2017-07-31 DIAGNOSIS — R262 Difficulty in walking, not elsewhere classified: Secondary | ICD-10-CM

## 2017-07-31 DIAGNOSIS — M79651 Pain in right thigh: Secondary | ICD-10-CM | POA: Diagnosis not present

## 2017-07-31 DIAGNOSIS — M25652 Stiffness of left hip, not elsewhere classified: Secondary | ICD-10-CM | POA: Diagnosis not present

## 2017-07-31 DIAGNOSIS — M79652 Pain in left thigh: Secondary | ICD-10-CM

## 2017-07-31 NOTE — Therapy (Addendum)
[facility] [address], [place], [place], [address] Phone: [phone]   Fax:  [phone]  Physical Therapy Treatment/Discharge Summary  Patient Details  Name: Marcus Reynolds MRN: [id number] Date of Birth: Mar 23, 1946 Referring Provider: Dorothyann Peng   Encounter Date: 07/31/2017  PT End of Session - 07/31/17 1709    Visit Number  9    Date for PT Re-Evaluation  08/21/17    Authorization Type  Medicare    PT Start Time  2957    PT Stop Time  1530    PT Time Calculation (min)  45 min    Activity Tolerance  Patient tolerated treatment well       Past Medical History:  Diagnosis Date  . Personal history of colonic polyps 12/25/2010    Past Surgical History:  Procedure Laterality Date  . COLONOSCOPY  12/25/2010   internal hemorrhoids  . COLONOSCOPY W/ POLYPECTOMY  2007   [place], [place]     right side  . TONSILECTOMY, ADENOIDECTOMY, BILATERAL MYRINGOTOMY AND TUBES      There were no vitals filed for this visit.  Subjective Assessment - 07/31/17 1448    Subjective  Did pretty well over the weekend following DN and ionto.  Sunday night the pain returned and Monday went to see the doctor and had cortisone injection in left posterior hip with some relief of tightness in the "rump" area but with tightness persists.    Was referred to Dr. Paulla Fore to be seen Monday.   I felt really good when I walked out here Friday.     Currently in Pain?  Yes    Pain Score  6  right 3-4/10    Pain Location  Leg    Pain Orientation  Right;Left    Pain Type  Acute pain                      [facility] Adult PT Treatment/Exercise - 07/31/17 0001      Knee/Hip Exercises: Stretches   Other Knee/Hip Stretches  knee to chest with opposite shoulder bias    Other Knee/Hip Stretches  review of HEP      Knee/Hip Exercises: Aerobic   Stationary Bike  L1 12 min while discussing response to treatment and  current status      Moist Heat Therapy   Number Minutes Moist Heat  5 Minutes    Moist Heat Location  -- bil HS      Manual Therapy   Soft tissue mobilization  proximal and mid bil HS    Kinesiotex  Facilitate Muscle      Kinesiotix   Facilitate Muscle   t shape along bil HS       Trigger Point Dry Needling - 07/31/17 1708    Hamstring Response  Twitch response elicited;Palpable increased muscle length             PT Short Term Goals - 07/31/17 1714      PT SHORT TERM GOAL #1   Title  The patient will demonstrate understanding of basic self care strategies, return to walking program and inital HEP    Status  Achieved      PT SHORT TERM GOAL #2   Title  The patient will report a 40% improvement in pain intensity with sitting, squatting and walking     Status  Achieved      PT SHORT TERM GOAL #3  Title  Lumbar flexion to 50 degrees and bil HS length to 75 degrees to improve stride length with walking    Time  4    Period  Weeks    Status  Partially Met      PT SHORT TERM GOAL #4   Title  The patient will be able to walk 1 mile with min intensity pain     Time  4    Period  Weeks    Status  Partially Met        PT Long Term Goals - 06/26/17 1642      PT LONG TERM GOAL #1   Title  The patient will be independent in safe self progression of HEP    Time  8    Period  Weeks    Status  New    Target Date  08/21/17      PT LONG TERM GOAL #2   Title  The patient will be able to walk 2 miles needed  to prepare for trip to [place] in April    Time  8    Period  Weeks    Status  New      PT LONG TERM GOAL #3   Title  The patient will report overall pain level with sitting, squatting and walking improved by 60%    Time  8    Period  Weeks    Status  New      PT LONG TERM GOAL #4   Title  The patient will have improved bil hip strength to 4+/5 needed for 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  Discussed appropriate HEP for this stage of recovery.  Therapist closely monitoring response with all interventions.  Decreased size and number of tender points in HS.  Left > right tender points persist.      Rehab Potential  Good    Clinical Impairments Affecting Rehab Potential  none    PT Frequency  2x / week    PT Treatment/Interventions  Electrical Stimulation;Cryotherapy;Ultrasound;Moist Heat;Iontophoresis 62m/ml Dexamethasone;Therapeutic activities;Therapeutic exercise;Patient/family education;Neuromuscular re-education;Manual techniques;Taping;Dry needling    PT Next Visit Plan  see how appt with Dr. RPaulla Forewent;  assess response to DN and kinesiotape;  progress HEP as tolerated;  recheck FOTO       Patient will benefit from skilled therapeutic intervention in order to improve the following deficits and impairments:  Pain, Increased fascial restricitons, Increased muscle spasms, Decreased activity tolerance, Decreased strength, Decreased range of motion, Difficulty walking  Visit Diagnosis: Pain in left thigh  Pain in right thigh  Muscle weakness (generalized)  Difficulty in walking, not elsewhere classified  Stiffness of left hip, not elsewhere classified    PHYSICAL  THERAPY DISCHARGE SUMMARY  Visits from Start of Care: 9  Current functional level related to goals / functional outcomes: The patient called to cancel his remaining appointments stating he is being referred to Dr. Paulla Fore  for continued care for "nerve pain coming from his back" and was told to discontinue PT at this time.     Remaining deficits: As above   Education / Equipment: HEP Plan: Patient agrees to discharge.  Patient goals were partially met. Patient is being discharged due to the physician's request.  ?????          Problem List Patient Active Problem List   Diagnosis Date Noted  . Routine general medical examination at a health care facility 09/23/2013  . ED (erectile dysfunction) 07/23/2011  . History of colonic polyps 12/25/2010  . HYPERTRIGLYCERIDEMIA 06/13/2009   [name], PT 07/31/17 5:15 PM Phone: [phone] Fax: [phone]  [name] 07/31/2017, 5:15 PM  [facility] [address], [place], [place], [address] Phone: [phone]   Fax:  [phone]  Name: Marcus Reynolds MRN: [id number] Date of Birth: 1945-12-14

## 2017-08-04 ENCOUNTER — Ambulatory Visit: Payer: Self-pay

## 2017-08-04 ENCOUNTER — Ambulatory Visit (INDEPENDENT_AMBULATORY_CARE_PROVIDER_SITE_OTHER): Payer: Medicare HMO

## 2017-08-04 ENCOUNTER — Ambulatory Visit (INDEPENDENT_AMBULATORY_CARE_PROVIDER_SITE_OTHER): Payer: Medicare HMO | Admitting: Sports Medicine

## 2017-08-04 ENCOUNTER — Encounter: Payer: Self-pay | Admitting: Sports Medicine

## 2017-08-04 VITALS — BP 142/92 | HR 84 | Ht 73.0 in | Wt 186.8 lb

## 2017-08-04 DIAGNOSIS — M4726 Other spondylosis with radiculopathy, lumbar region: Secondary | ICD-10-CM

## 2017-08-04 DIAGNOSIS — S76309A Unspecified injury of muscle, fascia and tendon of the posterior muscle group at thigh level, unspecified thigh, initial encounter: Secondary | ICD-10-CM

## 2017-08-04 DIAGNOSIS — M47816 Spondylosis without myelopathy or radiculopathy, lumbar region: Secondary | ICD-10-CM | POA: Diagnosis not present

## 2017-08-04 MED ORDER — GABAPENTIN 300 MG PO CAPS: ORAL_CAPSULE | ORAL | 1 refills | Status: DC

## 2017-08-04 NOTE — Progress Notes (Signed)
Marcus Reynolds. Marcus Reynolds, Marcus Reynolds at [place]  Marcus Reynolds - 72 y.o. male MRN [id number]  Date of birth: May 01, 1946  Visit Date: 08/04/2017  PCP: Dorena Cookey, MD   Referred by: Dorothyann Peng, NP   Scribe for today's visit: Josepha Pigg, CMA     SUBJECTIVE:  Marcus Reynolds is here for Follow-up (hamstring injury) .  Referred by: Dorothyann Peng, NP His hamstring pain symptoms INITIALLY: Began in December 2018 and started about 3 days after shoveling snow.  Described as moderate tightness, radiating to calves Worsened with activity, sitting. Pain is also worse in the morning. Improved with heat, sitting on pillow.  Additional associated symptoms include: He denies sciatic nerve pain, groin pain, hip pain. He did have gluteal pain but that resolved for steroid injection.     At this time symptoms are improving compared to onset, he reports that pain improves and then gets worse again.  He has been going to PT (x 5 weeks) and hasn't noticed much relief. He has tried dry needling with some relief. He has tried massage therapy. He saw Dorothyann Peng last week and received steroid injection, he got minimal relief. He has been taking IBU with some relief. He gets the most relief from sitting on heating pad.   No recent xray or MR   ROS Reports night time disturbances. Denies fevers, chills, or night sweats. Denies unexplained weight loss. Denies personal history of cancer. Denies changes in bowel or bladder habits. Denies recent unreported falls. Denies new or worsening dyspnea or wheezing. Denies headaches or dizziness.  Denies numbness, tingling or weakness  In the extremities.  Denies dizziness or presyncopal episodes, only when getting up too fast.  Denies lower extremity edema     HISTORY & PERTINENT PRIOR DATA:  Prior History reviewed and updated per electronic medical record.  Significant history, findings, studies and  interim changes include:  reports that  has never smoked. he has never used smokeless tobacco. No results for input(s): HGBA1C, LABURIC, CREATINE in the last 8760 hours. No specialty comments available. Problem  Osteoarthritis of Spine With Radiculopathy, Lumbar Region    OBJECTIVE:  VS:  HT:6\' 1"  (185.4 cm)   WT:186 lb 12.8 oz (84.7 kg)  BMI:24.65    BP:(!) 142/92  HR:84bpm  TEMP: ( )  RESP:98 %   PHYSICAL EXAM: Constitutional: WDWN, Non-toxic appearing. Psychiatric: Alert & appropriately interactive.  Not depressed or anxious appearing. Respiratory: No increased work of breathing.  Trachea Midline Eyes: Pupils are equal.  EOM intact without nystagmus.  No scleral icterus  NEUROVASCULAR exam: No clubbing or cyanosis appreciated No significant venous stasis changes Capillary Refill: normal, less than 2 seconds   Patient has tightness with bilateral hamstrings straight leg raise left worse than right but no true radicular symptoms.  He does have pain with palpation of left popliteal fossa and greater sciatic notch.  His left Achilles reflex is diminished to trace compared to the right which is 1+/4.  Bilateral patellar reflexes are 2+/4.  Lower extremity sensation is intact light touch.  Does have a moderate degree of weakness with resisted knee flexion worse on the left than on the right.  Knee extension strength is intact.  He has mild pain with palpation of the hamstring origin this is more focally over the greater sciatic notch.   ASSESSMENT & PLAN:   1. Hamstring injury, unspecified laterality, initial encounter   2. Osteoarthritis of spine with  radiculopathy, lumbar region    ++++++++++++++++++++++++++++++++++++++++++++ Orders & Meds:  Orders Placed This Encounter  Procedures  . DG Lumbar Spine Complete  . Ambulatory referral to Physical Medicine Rehab    Meds ordered this encounter  Medications  . gabapentin (NEURONTIN) 300 MG capsule    Sig: Start with 1 tab po  qhs X 1 week, then increase to 1 tab po bid X 1 week then 1 tab po tid prn    Dispense:  90 capsule    Refill:  1    ++++++++++++++++++++++++++++++++++++++++++++ PLAN:   Findings:  Symptoms are consistent with spinal stenosis discussed multiple options today.  Given no red flag symptoms trial of gabapentin and referral for empiric diagnostic and therapeutic epidural steroid injection recommended.  Once symptoms have improved continuing with therapeutic exercises recommended but okay to discontinue formal therapy at this time until symptoms are under better control from a medical standpoint.   Follow-up: Return in about 6 weeks (around 09/15/2017).   Pertinent documentation may be included in additional procedure notes, imaging studies, problem based documentation and patient instructions. Please see these sections of the encounter for additional information regarding this visit. CMA/ATC served as Education administrator during this visit. History, Physical, and Plan performed by medical provider. Documentation and orders reviewed and attested to.      Gerda Diss, [facility] Physician

## 2017-08-04 NOTE — Patient Instructions (Addendum)
It was nice to meet you.  You can resume walking approximately 2 weeks after the injection.  I will also like for you to look at the video below.  You can try them before 2 weeks after the injection however do not aggressively start them until that timeframe.   Also check out [employer]" which is a program developed by Dr. Minerva Ends.   There are links to a couple of his YouTube Videos below and I would like to see performing one of his videos 5-6 days per week.    A good intro video is: "Independence from Pain 7-minute Video" - [email or web address].com   His more advanced video is: "Powerful Posture and Pain Relief: 12 minutes of Foundation Training" - https://youtu.be/[email or web address]  Do not try to attempt this entire video when first beginning.    Try breaking of each exercise that he goes into shorter segments.  Otherwise if they perform an exercise for 45 seconds, start with 15 seconds and rest and then resume when they begin the new activity.    If you work your way up to doing this 12 minute video, I expect you will see significant improvements in your pain.  If you enjoy his videos and would like to find out more you can look on his website: [email or web address]  He has a workout streaming option as well as a DVD set available for purchase.  Amazon has the best price for his DVDs.

## 2017-08-06 NOTE — Progress Notes (Signed)
These findings are as discussed during the office visit with the addition of possible kidney stones.  These are likely incidental and have no clinical relevance especially given the nature of his pain.

## 2017-08-07 ENCOUNTER — Encounter: Payer: Medicare HMO | Admitting: Physical Therapy

## 2017-08-14 ENCOUNTER — Encounter: Payer: Medicare HMO | Admitting: Physical Therapy

## 2017-08-18 ENCOUNTER — Encounter: Payer: Self-pay | Admitting: Sports Medicine

## 2017-08-21 ENCOUNTER — Ambulatory Visit: Payer: Medicare HMO | Admitting: Physical Therapy

## 2017-09-09 ENCOUNTER — Ambulatory Visit (INDEPENDENT_AMBULATORY_CARE_PROVIDER_SITE_OTHER): Payer: Medicare HMO | Admitting: Physical Medicine and Rehabilitation

## 2017-09-09 ENCOUNTER — Encounter (INDEPENDENT_AMBULATORY_CARE_PROVIDER_SITE_OTHER): Payer: Self-pay | Admitting: Physical Medicine and Rehabilitation

## 2017-09-09 ENCOUNTER — Ambulatory Visit (INDEPENDENT_AMBULATORY_CARE_PROVIDER_SITE_OTHER): Payer: Medicare HMO

## 2017-09-09 VITALS — BP 149/92 | HR 86 | Temp 97.8°F

## 2017-09-09 DIAGNOSIS — G8929 Other chronic pain: Secondary | ICD-10-CM | POA: Diagnosis not present

## 2017-09-09 DIAGNOSIS — M5441 Lumbago with sciatica, right side: Secondary | ICD-10-CM

## 2017-09-09 DIAGNOSIS — M6289 Other specified disorders of muscle: Secondary | ICD-10-CM

## 2017-09-09 DIAGNOSIS — M47816 Spondylosis without myelopathy or radiculopathy, lumbar region: Secondary | ICD-10-CM

## 2017-09-09 DIAGNOSIS — M5136 Other intervertebral disc degeneration, lumbar region: Secondary | ICD-10-CM

## 2017-09-09 DIAGNOSIS — M5416 Radiculopathy, lumbar region: Secondary | ICD-10-CM

## 2017-09-09 DIAGNOSIS — M5442 Lumbago with sciatica, left side: Secondary | ICD-10-CM

## 2017-09-09 MED ORDER — METHYLPREDNISOLONE ACETATE 80 MG/ML IJ SUSP
80.0000 mg | Freq: Once | INTRAMUSCULAR | Status: AC
  Administered 2017-09-09: 80 mg

## 2017-09-09 NOTE — Progress Notes (Signed)
Numeric Pain Rating Scale and Functional Assessment Average Pain 5 Pain Right Now 3 My pain is intermittent and aching Pain is worse with: bending, sitting and standing Pain improves with: rest, heat/ice and therapy/exercise   In the last MONTH (on 0-10 scale) has pain interfered with the following?  1. General activity like being  able to carry out your everyday physical activities such as walking, climbing stairs, carrying groceries, or moving a chair?  Rating(3)  2. Relation with others like being able to carry out your usual social activities and roles such as  activities at home, at work and in your community. Rating(3)  3. Enjoyment of life such that you have  been bothered by emotional problems such as feeling anxious, depressed or irritable?  Rating(3)

## 2017-09-09 NOTE — Patient Instructions (Signed)

## 2017-09-10 ENCOUNTER — Encounter: Payer: Self-pay | Admitting: Sports Medicine

## 2017-09-15 ENCOUNTER — Ambulatory Visit: Payer: Medicare HMO | Admitting: Sports Medicine

## 2017-09-15 ENCOUNTER — Encounter (INDEPENDENT_AMBULATORY_CARE_PROVIDER_SITE_OTHER): Payer: Self-pay | Admitting: Physical Medicine and Rehabilitation

## 2017-09-15 NOTE — Progress Notes (Signed)
Marcus Reynolds - 72 y.o. male MRN [id number]  Date of birth: 1945-12-19  Office Visit Note: Visit Date: 09/09/2017 PCP: Dorena Cookey, MD Referred by: Dorena Cookey, MD  Subjective: Chief Complaint  Patient presents with  . Lower Back - Pain  . Right Hip - Pain  . Left Hip - Pain   HPI: Marcus Reynolds is a 72 year old gentleman that comes in today with low back pain and bilateral buttock and upper hamstring pain.  His symptoms are worse with prolonged sitting.  His symptoms started in the middle part after shoveling snow of December and he saw his primary care physician and they felt like this was a sprain strain type of injury with hamstring problems.  He was doing some activity at the time.  He went on to have physical therapy and medication management with anti-inflammatories.  Overall with time and therapy he has improved to a degree.  He continues to have the hamstring type pain with prolonged sitting.  He has not had an MRI of the lumbar spine but did have x-rays which were reviewed today.  He was sent today for possible epidural injection by Dr. Teresa Coombs thinking this may be more of a symptom of the lumbar spine whether it be stenosis or disc herniation etc.  He has no red flag symptoms of weakness or other issues.  From his exam today he does have pain with extension and does have an equivocal slump test bilaterally with tight hamstrings.  I think given the fact that he has had prolonged symptoms as long it would be wise to complete a diagnostic and hopefully therapeutic general epidural injection.  If he gets great relief and it would be somewhat diagnostic and they can just watch him.  If it does not seem to help in his symptoms stagnate or repeat turned to a great degree than he likely needs MRI of the lumbar spine.   Review of Systems  Constitutional: Negative for chills, fever, malaise/fatigue and weight loss.  HENT: Negative for hearing loss and sinus pain.   Eyes: Negative for  blurred vision, double vision and photophobia.  Respiratory: Negative for cough and shortness of breath.   Cardiovascular: Negative for chest pain, palpitations and leg swelling.  Gastrointestinal: Negative for abdominal pain, nausea and vomiting.  Genitourinary: Negative for flank pain.  Musculoskeletal: Positive for back pain and joint pain. Negative for myalgias.  Skin: Negative for itching and rash.  Neurological: Negative for tremors, focal weakness and weakness.  Endo/Heme/Allergies: Negative.   Psychiatric/Behavioral: Negative for depression.  All other systems reviewed and are negative.  Otherwise per HPI.  Assessment & Plan: Visit Diagnoses:  1. Lumbar radiculopathy   2. Chronic bilateral low back pain with bilateral sciatica   3. Spondylosis without myelopathy or radiculopathy, lumbar region   4. Degenerative disc disease, lumbar   5. Hamstring tightness     Plan: Findings:  Several months of chronic low back pain and bilateral hamstring pain seemingly consistent with a spinal source of issue which could be a combination of disc bulging/protrusion and stenosis given his age.  We had a long talk about typical issues of the lumbar spine and their symptoms.  He has had good conservative care through his primary physician as well as Dr. Teresa Coombs.  This is included gabapentin which she is on a pretty good dose as well as anti-inflammatories and physical therapy.  We talked at great length about activity modification and core strengthening and natural  history of the issue of the lumbar spine.  I do think the next step is epidural injection which we performed today.  Depending on relief she should look at MRI of the lumbar spine to rule out significant stenosis.  He has no red flag symptoms to warrant MRI at this point.  He should continue with the gabapentin.  If he gets a great relief with the injection and its noticeable may be able to wean down the gabapentin.  He will continue to  follow with Dr. Paulla Fore as well.  Greater than 50% of this visit (total duration of visit 30 min.) was spent in counseling and coordination of care discussing    Meds & Orders:  Meds ordered this encounter  Medications  . methylPREDNISolone acetate (DEPO-MEDROL) injection 80 mg    Orders Placed This Encounter  Procedures  . XR C-ARM NO REPORT  . Epidural Steroid injection    Follow-up: Return for Dr. Paulla Fore.   Procedures: No procedures performed  Lumbar Epidural Steroid Injection - Interlaminar Approach with Fluoroscopic Guidance  Patient: Marcus Reynolds      Date of Birth: 72/14/1947 MRN: [id number] PCP: Dorena Cookey, MD      Visit Date: 09/09/2017   Universal Protocol:     Consent Given By: the patient  Position: PRONE  Additional Comments: Vital signs were monitored before and after the procedure. Patient was prepped and draped in the usual sterile fashion. The correct patient, procedure, and site was verified.   Injection Procedure Details:  Procedure Site One Meds Administered:  Meds ordered this encounter  Medications  . methylPREDNISolone acetate (DEPO-MEDROL) injection 80 mg     Laterality: Left  Location/Site:  L5-S1  Needle size: 20 G  Needle type: Tuohy  Needle Placement: Paramedian epidural  Findings:   -Comments: Excellent flow of contrast into the epidural space.  Procedure Details: Using a paramedian approach from the side mentioned above, the region overlying the inferior lamina was localized under fluoroscopic visualization and the soft tissues overlying this structure were infiltrated with 4 ml. of 1% Lidocaine without Epinephrine. The Tuohy needle was inserted into the epidural space using a paramedian approach.   The epidural space was localized using loss of resistance along with lateral and bi-planar fluoroscopic views.  After negative aspirate for air, blood, and CSF, a 2 ml. volume of Isovue-250 was injected into the epidural space and  the flow of contrast was observed. Radiographs were obtained for documentation purposes.    The injectate was administered into the level noted above.   Additional Comments:  The patient tolerated the procedure well Dressing: Band-Aid    Post-procedure details: Patient was observed during the procedure. Post-procedure instructions were reviewed.  Patient left the clinic in stable condition.   Clinical History: LUMBAR SPINE - COMPLETE 4+ VIEW  COMPARISON:  None.  FINDINGS: Minimal rightward curvature of the thoracolumbar spine. Vertebral body heights appear normal. Mild degenerative changes at L3-L4 and L4-L5 with moderate degenerative changes at L5-S1. Possible calcifications overlying the kidneys.  IMPRESSION: 1. No acute osseous abnormality. Mild to moderate degenerative changes most notable at L5-S1 2. Possible stones within the kidneys.   Electronically Signed   By: Donavan Foil M.D.   On: 08/04/2017 14:09   He reports that he has never smoked. He has never used smokeless tobacco. No results for input(s): HGBA1C, LABURIC in the last 8760 hours.  Objective:  VS:  HT:    WT:   BMI:     BP:(!)  149/92  HR:86bpm  TEMP:97.8 F (36.6 C)( )  RESP:97 % Physical Exam  Constitutional: He is oriented to person, place, and time. He appears well-developed and well-nourished. No distress.  HENT:  Head: Normocephalic and atraumatic.  Nose: Nose normal.  Mouth/Throat: Oropharynx is clear and moist.  Eyes: Pupils are equal, round, and reactive to light. Conjunctivae are normal.  Neck: Normal range of motion. Neck supple. No tracheal deviation present.  Cardiovascular: Regular rhythm and intact distal pulses.  Pulmonary/Chest: Effort normal and breath sounds normal.  Abdominal: Soft. He exhibits no distension. There is no rebound and no guarding.  Musculoskeletal: He exhibits no deformity.  Patient ambulates without aid.  He does ambulate with a slightly forward  flexed spine.  He has no pain with hip rotation internal or external.  He does have tightness of both hamstrings and a negative but equivocal slump test bilaterally.  He has good distal strength without clonus.  No pain over the greater trochanters.  Neurological: He is alert and oriented to person, place, and time. He exhibits normal muscle tone. Coordination normal.  Skin: Skin is warm. No rash noted.  Psychiatric: He has a normal mood and affect. His behavior is normal.  Nursing note and vitals reviewed.   Ortho Exam Imaging: No results found.  Past Medical/Family/Surgical/Social History: Medications & Allergies reviewed per EMR, new medications updated. Patient Active Problem List   Diagnosis Date Noted  . Osteoarthritis of spine with radiculopathy, lumbar region 08/04/2017  . Routine general medical examination at a health care facility 09/23/2013  . ED (erectile dysfunction) 07/23/2011  . History of colonic polyps 12/25/2010  . HYPERTRIGLYCERIDEMIA 06/13/2009   Past Medical History:  Diagnosis Date  . Personal history of colonic polyps 12/25/2010   Family History  Problem Relation Age of Onset  . Heart disease Mother   . Diabetes Mother   . Ulcerative colitis Mother   . Colon cancer Neg Hx   . Esophageal cancer Neg Hx   . Rectal cancer Neg Hx   . Stomach cancer Neg Hx    Past Surgical History:  Procedure Laterality Date  . COLONOSCOPY  12/25/2010   internal hemorrhoids  . COLONOSCOPY W/ POLYPECTOMY  2007   [place], [place]     right side  . TONSILECTOMY, ADENOIDECTOMY, BILATERAL MYRINGOTOMY AND TUBES     Social History   Occupational History  . Occupation: Retired  Tobacco Use  . Smoking status: Never Smoker  . Smokeless tobacco: Never Used  Substance and Sexual Activity  . Alcohol use: Yes    Alcohol/week: 0.0 oz    Comment: socially  . Drug use: No  . Sexual activity: Not on file

## 2017-09-15 NOTE — Procedures (Signed)
Lumbar Epidural Steroid Injection - Interlaminar Approach with Fluoroscopic Guidance  Patient: Marcus Reynolds      Date of Birth: 08/21/1945 MRN: [id number] PCP: Dorena Cookey, MD      Visit Date: 09/09/2017   Universal Protocol:     Consent Given By: the patient  Position: PRONE  Additional Comments: Vital signs were monitored before and after the procedure. Patient was prepped and draped in the usual sterile fashion. The correct patient, procedure, and site was verified.   Injection Procedure Details:  Procedure Site One Meds Administered:  Meds ordered this encounter  Medications  . methylPREDNISolone acetate (DEPO-MEDROL) injection 80 mg     Laterality: Left  Location/Site:  L5-S1  Needle size: 20 G  Needle type: Tuohy  Needle Placement: Paramedian epidural  Findings:   -Comments: Excellent flow of contrast into the epidural space.  Procedure Details: Using a paramedian approach from the side mentioned above, the region overlying the inferior lamina was localized under fluoroscopic visualization and the soft tissues overlying this structure were infiltrated with 4 ml. of 1% Lidocaine without Epinephrine. The Tuohy needle was inserted into the epidural space using a paramedian approach.   The epidural space was localized using loss of resistance along with lateral and bi-planar fluoroscopic views.  After negative aspirate for air, blood, and CSF, a 2 ml. volume of Isovue-250 was injected into the epidural space and the flow of contrast was observed. Radiographs were obtained for documentation purposes.    The injectate was administered into the level noted above.   Additional Comments:  The patient tolerated the procedure well Dressing: Band-Aid    Post-procedure details: Patient was observed during the procedure. Post-procedure instructions were reviewed.  Patient left the clinic in stable condition.

## 2017-09-24 ENCOUNTER — Encounter: Payer: Self-pay | Admitting: Sports Medicine

## 2017-09-24 ENCOUNTER — Ambulatory Visit (INDEPENDENT_AMBULATORY_CARE_PROVIDER_SITE_OTHER): Payer: Medicare HMO | Admitting: Sports Medicine

## 2017-09-24 VITALS — BP 122/78 | HR 84 | Ht 73.0 in | Wt 188.2 lb

## 2017-09-24 DIAGNOSIS — S76309D Unspecified injury of muscle, fascia and tendon of the posterior muscle group at thigh level, unspecified thigh, subsequent encounter: Secondary | ICD-10-CM | POA: Diagnosis not present

## 2017-09-24 DIAGNOSIS — M4726 Other spondylosis with radiculopathy, lumbar region: Secondary | ICD-10-CM | POA: Diagnosis not present

## 2017-09-24 DIAGNOSIS — S76309A Unspecified injury of muscle, fascia and tendon of the posterior muscle group at thigh level, unspecified thigh, initial encounter: Secondary | ICD-10-CM

## 2017-09-24 MED ORDER — DIAZEPAM 5 MG PO TABS: 5.0000 mg | ORAL_TABLET | Freq: Three times a day (TID) | ORAL | 0 refills | Status: DC | PRN

## 2017-09-24 NOTE — Progress Notes (Signed)
   Marcus Reynolds - 72 y.o. male MRN [id number]  Date of birth: 01/20/1946  Visit Date:   PCP: Dorena Cookey, MD   Referred by: Dorena Cookey, MD  Please see additional documentation for HPI, review of systems.  HISTORY & PERTINENT PRIOR DATA:  Prior History reviewed and updated per electronic medical record.  Significant/pertinent history, findings, studies include:  reports that he has never smoked. He has never used smokeless tobacco. No results for input(s): HGBA1C, LABURIC, CREATINE in the last 8760 hours. No specialty comments available. No problems updated.  OBJECTIVE:  VS:  HT:6\' 1"  (185.4 cm)   WT:188 lb 3.2 oz (85.4 kg)  BMI:24.84    BP:122/78  HR:84bpm  TEMP: ( )  RESP:96 %   PHYSICAL EXAM: Constitutional: WDWN, Non-toxic appearing. Psychiatric: Alert & appropriately interactive.  Not depressed or anxious appearing. Respiratory: No increased work of breathing.  Trachea Midline Eyes: Pupils are equal.  EOM intact without nystagmus.  No scleral icterus  Vascular Exam: warm to touch no edema  lower extremity neuro exam: unremarkable normal strength normal sensation normal reflexes  MSK Exam: He still has a persistent anterior chain dominance with tight hip flexors but overall his heel toe walking and lower extremity neural tension signs are normal.   ASSESSMENT & PLAN:   1. Hamstring injury, unspecified laterality, initial encounter   2. Osteoarthritis of spine with radiculopathy, lumbar region   3. Hamstring injury, unspecified laterality, subsequent encounter     PLAN:  Resume Goodman exercises Valium prescription for travel to be taken as needed for muscle relaxant properties.  Continue with gabapentin until further follow-up in 6 weeks.  Continue home therapeutic exercise program.  Responding well to injection.    Follow-up: Return in about 6 weeks (around 11/05/2017).

## 2017-09-24 NOTE — Progress Notes (Signed)
  Marcus Reynolds. [place], [place] at [facility] [phone]  Marcus Reynolds - 72 y.o. male MRN [id number]  Date of birth: November 29, 1945  Visit Date: 09/24/2017  PCP: Dorena Cookey, MD   Referred by: Dorena Cookey, MD  Scribe for today's visit: Wendy Poet, LAT, ATC     SUBJECTIVE:  Marcus Reynolds is here for No chief complaint on file. .   Notes from 08/04/17: His hamstring pain symptoms INITIALLY: Began in December 2018 and started about 3 days after shoveling snow.  Described as moderate tightness, radiating to calves Worsened with activity, sitting. Pain is also worse in the morning. Improved with heat, sitting on pillow.  Additional associated symptoms include: He denies sciatic nerve pain, groin pain, hip pain. He did have gluteal pain but that resolved for steroid injection.     At this time symptoms are improving compared to onset, he reports that pain improves and then gets worse again.  He has been going to PT (x 5 weeks) and hasn't noticed much relief. He has tried dry needling with some relief. He has tried massage therapy. He saw Dorothyann Peng last week and received steroid injection, he got minimal relief. He has been taking IBU with some relief. He gets the most relief from sitting on heating pad.   No recent xray or MR  09/24/17: Compared to the last office visit on 08/04/17, his previously described low back pain and radicular h/s symptoms are improving and feels about 90% improved.  He states that prior to seeing Dr. Ernestina Patches, he felt about 80% but was still having proximal h/s issues.  He states that since the injection, the h/s pain/symptoms have resolved and feels an additional 10% improved.  He states that he is back to his normal routine in terms of exercise and working out. Current symptoms are mild & are nonradiating.  However, he does still note some tightness in his B hamstrings. He has been taking Gabapentin 300mg  qd at night  before bed.  He also takes IBU prn (about once/week).  He saw Dr. Ernestina Patches on 09/09/17 for an L5-S1 epidural.  He's tried dry needling and PT in the past w/ little relief.  He has been doing his own HEP that includes glute bridges and piriformis stretching.  He states that he will be going out of the country for 3 weeks starting next week.    ROS Denies night time disturbances. Denies fevers, chills, or night sweats. Denies unexplained weight loss. Denies personal history of cancer. Denies changes in bowel or bladder habits. Denies recent unreported falls. Denies new or worsening dyspnea or wheezing. Denies headaches or dizziness.  Denies numbness, tingling or weakness  In the extremities.  Denies dizziness or presyncopal episodes Denies lower extremity edema      Please see additional documentation for Objective, Assessment and Plan sections. Pertinent additional documentation may be included in corresponding procedure notes, imaging studies, problem based documentation and patient instructions. Please see these sections of the encounter for additional information regarding this visit.  CMA/ATC served as Education administrator during this visit. History, Physical, and Plan performed by medical provider. Documentation and orders reviewed and attested to.      Gerda Diss, [place] [facility] Physician

## 2017-10-17 ENCOUNTER — Encounter: Payer: Self-pay | Admitting: Sports Medicine

## 2017-10-23 DIAGNOSIS — H52223 Regular astigmatism, bilateral: Secondary | ICD-10-CM | POA: Diagnosis not present

## 2017-10-23 DIAGNOSIS — H5213 Myopia, bilateral: Secondary | ICD-10-CM | POA: Diagnosis not present

## 2017-10-23 DIAGNOSIS — H25813 Combined forms of age-related cataract, bilateral: Secondary | ICD-10-CM | POA: Diagnosis not present

## 2017-10-23 DIAGNOSIS — H524 Presbyopia: Secondary | ICD-10-CM | POA: Diagnosis not present

## 2017-10-27 ENCOUNTER — Telehealth: Payer: Self-pay

## 2017-10-27 ENCOUNTER — Encounter: Payer: Self-pay | Admitting: Sports Medicine

## 2017-10-27 NOTE — Telephone Encounter (Signed)
That is fine 

## 2017-10-27 NOTE — Telephone Encounter (Signed)
Pt was on Dr. Honor Junes schedule for CPE, however Sherren Mocha will not be in office this week. Pt is asking if Marcus Reynolds can do his CPE.   Cory - Please advise. Thanks!

## 2017-10-29 ENCOUNTER — Encounter: Payer: Medicare HMO | Admitting: Family Medicine

## 2017-10-29 NOTE — Progress Notes (Signed)
[name]. Adreena Willits, [place] at [place]  Shiro Ellerman - 72 y.o. male MRN [id number]  Date of birth: 1945-10-21  Visit Date: 10/30/2017  PCP: Dorena Cookey, MD   Referred by: Dorena Cookey, MD  I acted as a scribe for Dr. Teresa Coombs, DO Anselmo Pickler, LPN     SUBJECTIVE:  Marcus Reynolds is here for Follow-up (hamstring injury, LBP ) .  Notes from 08/04/17: His hamstring pain symptoms INITIALLY: Began in December 2018 and started about 3 days after shoveling snow.  Described as moderate tightness, radiating to calves Worsened with activity, sitting. Pain is also worse in the morning. Improved with heat, sitting on pillow.  Additional associated symptoms include: He denies sciatic nerve pain, groin pain, hip pain. He did have gluteal pain but that resolved for steroid injection.     At this time symptoms are improving compared to onset, he reports that pain improves and then gets worse again.  He has been going to PT (x 5 weeks) and hasn't noticed much relief. He has tried dry needling with some relief. He has tried massage therapy. He saw Dorothyann Peng last week and received steroid injection, he got minimal relief. He has been taking IBU with some relief. He gets the most relief from sitting on heating pad.   No recent xray or MR  09/24/17: Compared to the last office visit on 08/04/17, his previously described low back pain and radicular h/s symptoms are improving and feels about 90% improved.  He states that prior to seeing Dr. Ernestina Patches, he felt about 80% but was still having proximal h/s issues.  He states that since the injection, the h/s pain/symptoms have resolved and feels an additional 10% improved.  He states that he is back to his normal routine in terms of exercise and working out. Current symptoms are mild & are nonradiating.  However, he does still note some tightness in his B hamstrings. He has been taking  Gabapentin 300mg  qd at night before bed.  He also takes IBU prn (about once/week).  He saw Dr. Ernestina Patches on 09/09/17 for an L5-S1 epidural.  He's tried dry needling and PT in the past w/ little relief.  He has been doing his own HEP that includes glute bridges and piriformis stretching.  He states that he will be going out of the country for 3 weeks starting next week.  10/30/2017: Compared to the last office visit on 09/24/17, his previously described symptoms are worsening Pt was on a flight for 10 hours 2 weeks ago. Current symptoms are moderate & are radiating to left upper thigh, having some tingling down left leg, symptoms are worse in the morning. He has been walking, exercising at th gym once a week. Pt is still taking Gabapentin mainly at night helping to sleep but not sure if helping the pain. Also using Ibuprofen 200 mg TID with relief and sitting on a heating pain. Pt would like to discuss having MRI.  ROS Denies night time disturbances. Denies fevers, chills, or night sweats. Denies unexplained weight loss. Denies personal history of cancer. Denies changes in bowel or bladder habits. Denies recent unreported falls. Denies new or worsening dyspnea or wheezing. Denies headaches or dizziness.  Reports numbness, tingling or weakness  In the extremities.  Denies dizziness or presyncopal episodes Denies lower extremity edema    HISTORY & PERTINENT PRIOR DATA:  Prior History reviewed and updated per electronic medical record.  Significant/pertinent history, findings, studies include:  reports that he has never smoked. He has never used smokeless tobacco. No results for input(s): HGBA1C, LABURIC, CREATINE in the last 8760 hours. No specialty comments available. No problems updated.  OBJECTIVE:  VS:  HT:6\' 1"  (185.4 cm)   WT:189 lb 4 oz (85.8 kg)  BMI:24.97    BP:130/82  HR:74bpm  TEMP: ( )  RESP:    PHYSICAL EXAM: Constitutional: WDWN, Non-toxic appearing. Psychiatric: Alert &  appropriately interactive.  Not depressed or anxious appearing. Respiratory: No increased work of breathing.  Trachea Midline Eyes: Pupils are equal.  EOM intact without nystagmus.  No scleral icterus  Vascular Exam: warm to touch no edema  lower extremity neuro exam: + lower extremity extremity neural tension signs.  Pain with palpation of the popliteal compression test as well as greater sciatic notch.  Slight weakness with toe walking.  Persistent TTP over the hamstring musculature.  MSK Exam:    ASSESSMENT & PLAN:   1. Hamstring injury, unspecified laterality, initial encounter   2. Osteoarthritis of spine with radiculopathy, lumbar region     PLAN: Concern for likely lumbar radiculopathy.  Further diagnostic evaluation indicated at this point given lack of failure with epidural steroid injection.  Gabapentin for diagnostic and therapeutic effect.  Follow-up: Return for mri review in person.      Please see additional documentation for Objective, Assessment and Plan sections. Pertinent additional documentation may be included in corresponding procedure notes, imaging studies, problem based documentation and patient instructions. Please see these sections of the encounter for additional information regarding this visit.  CMA/ATC served as Education administrator during this visit. History, Physical, and Plan performed by medical provider. Documentation and orders reviewed and attested to.      Gerda Diss, [place] [facility] Physician

## 2017-10-29 NOTE — Telephone Encounter (Signed)
Pt already scheduled to see Sherren Mocha next week.

## 2017-10-30 ENCOUNTER — Ambulatory Visit (INDEPENDENT_AMBULATORY_CARE_PROVIDER_SITE_OTHER): Payer: Medicare HMO | Admitting: Sports Medicine

## 2017-10-30 ENCOUNTER — Encounter: Payer: Self-pay | Admitting: Sports Medicine

## 2017-10-30 VITALS — BP 130/82 | HR 74 | Ht 73.0 in | Wt 189.2 lb

## 2017-10-30 DIAGNOSIS — S76309A Unspecified injury of muscle, fascia and tendon of the posterior muscle group at thigh level, unspecified thigh, initial encounter: Secondary | ICD-10-CM | POA: Diagnosis not present

## 2017-10-30 DIAGNOSIS — M4726 Other spondylosis with radiculopathy, lumbar region: Secondary | ICD-10-CM

## 2017-10-30 MED ORDER — GABAPENTIN 300 MG PO CAPS: ORAL_CAPSULE | ORAL | 1 refills | Status: DC

## 2017-10-30 NOTE — Patient Instructions (Signed)

## 2017-11-06 ENCOUNTER — Ambulatory Visit (INDEPENDENT_AMBULATORY_CARE_PROVIDER_SITE_OTHER): Payer: Medicare HMO | Admitting: Family Medicine

## 2017-11-06 ENCOUNTER — Encounter: Payer: Self-pay | Admitting: Family Medicine

## 2017-11-06 VITALS — BP 110/78 | HR 70 | Temp 97.8°F | Ht 73.0 in | Wt 188.0 lb

## 2017-11-06 DIAGNOSIS — E781 Pure hyperglyceridemia: Secondary | ICD-10-CM | POA: Diagnosis not present

## 2017-11-06 DIAGNOSIS — Z860100 Personal history of colon polyps, unspecified: Secondary | ICD-10-CM

## 2017-11-06 DIAGNOSIS — R351 Nocturia: Secondary | ICD-10-CM

## 2017-11-06 DIAGNOSIS — N401 Enlarged prostate with lower urinary tract symptoms: Secondary | ICD-10-CM | POA: Diagnosis not present

## 2017-11-06 DIAGNOSIS — Z0000 Encounter for general adult medical examination without abnormal findings: Secondary | ICD-10-CM | POA: Diagnosis not present

## 2017-11-06 DIAGNOSIS — N529 Male erectile dysfunction, unspecified: Secondary | ICD-10-CM | POA: Diagnosis not present

## 2017-11-06 LAB — POCT URINALYSIS DIPSTICK
Bilirubin, UA: NEGATIVE
Glucose, UA: NEGATIVE
Ketones, UA: NEGATIVE
LEUKOCYTES UA: NEGATIVE
NITRITE UA: NEGATIVE
Odor: NEGATIVE
PH UA: 6 (ref 5.0–8.0)
Protein, UA: POSITIVE — AB
RBC UA: NEGATIVE
UROBILINOGEN UA: 0.2 U/dL

## 2017-11-06 LAB — LIPID PANEL
Cholesterol: 152 mg/dL (ref 0–200)
HDL: 40.9 mg/dL (ref >39.00)
LDL CALC: 80 mg/dL (ref 0–99)
NonHDL: 110.97
Total CHOL/HDL Ratio: 4
Triglycerides: 153 mg/dL — ABNORMAL HIGH (ref 0.0–149.0)
VLDL: 30.6 mg/dL (ref 0.0–40.0)

## 2017-11-06 LAB — HEPATIC FUNCTION PANEL
ALBUMIN: 4.7 g/dL (ref 3.5–5.2)
ALT: 13 U/L (ref 0–53)
AST: 20 U/L (ref 0–37)
Alkaline Phosphatase: 96 U/L (ref 39–117)
BILIRUBIN TOTAL: 0.6 mg/dL (ref 0.2–1.2)
Bilirubin, Direct: 0.2 mg/dL (ref 0.0–0.3)
Total Protein: 7.1 g/dL (ref 6.0–8.3)

## 2017-11-06 LAB — CBC WITH DIFFERENTIAL/PLATELET
BASOS ABS: 0.1 10*3/uL (ref 0.0–0.1)
Basophils Relative: 1 % (ref 0.0–3.0)
EOS ABS: 0.2 10*3/uL (ref 0.0–0.7)
EOS PCT: 4.2 % (ref 0.0–5.0)
HCT: 40.3 % (ref 39.0–52.0)
Hemoglobin: 13.9 g/dL (ref 13.0–17.0)
Lymphocytes Relative: 26.7 % (ref 12.0–46.0)
Lymphs Abs: 1.5 10*3/uL (ref 0.7–4.0)
MCHC: 34.4 g/dL (ref 30.0–36.0)
MCV: 90.1 fl (ref 78.0–100.0)
MONO ABS: 0.6 10*3/uL (ref 0.1–1.0)
Monocytes Relative: 10 % (ref 3.0–12.0)
Neutro Abs: 3.2 10*3/uL (ref 1.4–7.7)
Neutrophils Relative %: 58.1 % (ref 43.0–77.0)
Platelets: 218 10*3/uL (ref 150.0–400.0)
RBC: 4.47 Mil/uL (ref 4.22–5.81)
RDW: 12.9 % (ref 11.5–15.5)
WBC: 5.5 10*3/uL (ref 4.0–10.5)

## 2017-11-06 LAB — BASIC METABOLIC PANEL
BUN: 26 mg/dL — ABNORMAL HIGH (ref 6–23)
CALCIUM: 9.8 mg/dL (ref 8.4–10.5)
CO2: 29 mEq/L (ref 19–32)
CREATININE: 1.22 mg/dL (ref 0.40–1.50)
Chloride: 103 mEq/L (ref 96–112)
GFR: 62 mL/min (ref >60.00)
Glucose, Bld: 101 mg/dL — ABNORMAL HIGH (ref 70–99)
Potassium: 4.3 mEq/L (ref 3.5–5.1)
SODIUM: 142 meq/L (ref 135–145)

## 2017-11-06 LAB — TSH: TSH: 3.04 u[IU]/mL (ref 0.35–4.50)

## 2017-11-06 LAB — PSA: PSA: 2.38 ng/mL (ref 0.10–4.00)

## 2017-11-06 MED ORDER — SILDENAFIL CITRATE 20 MG PO TABS: ORAL_TABLET | ORAL | 11 refills | Status: DC

## 2017-11-06 MED ORDER — GEMFIBROZIL 600 MG PO TABS: 600.0000 mg | ORAL_TABLET | Freq: Two times a day (BID) | ORAL | 3 refills | Status: DC

## 2017-11-06 NOTE — Patient Instructions (Signed)
Labs today.......... I will call you if there is anything abnormal  Call your insurance company to get on the list for the new shingles vaccine  Continue your current meds and exercise program  Follow-up in one year for general physical exam sooner if any problems

## 2017-11-06 NOTE — Progress Notes (Signed)
Marcus Reynolds is a 72 year old married male nonsmoker who comes in today for annual physical evaluation because of a history of hyperlipidemia mild ED a new problem of back pain  For hyperlipidemia he takes them for basal 600 mg 2 tabs prior to his evening meal.  Last December he was shoveling snow and 2 days later developed severe back pain. He came in and saw [facility]. Physical therapy did not relieve his pain. He went to neurosurgery. Examination there showed no evidence of any disc rupture therefore he was treated symptomatically with physical therapy and injections. He seemed to be doing well until this past Easter when he flew to [place] to see her daughter. On the way back he was in a cramped seat and since that time has had more pain. He contacted his neurosurgeon who scheduled an MRI.  Uses generic Viagra when necessary for ED  For the back pain is taken a combination of Valium and Neurontin.  He gets routine eye care, dental care, colonoscopy 2016 showed 2 polyps.. He's due to go back for recall in 2021.  Vaccinations up-to-date information given on the new shingles vaccine  Cognitive function normal despite his back pain he walks daily. Home health safety reviewed no issues identified, no guns in the house, he does have a healthcare power of attorney and living well  14 point review systems reviewed and otherwise negative  EKG was done because a history of hyperlipidemia and ED. EKG was normal and unchanged  BP 110/78 (BP Location: Left Arm, Patient Position: Sitting, Cuff Size: Normal)   Pulse 70   Temp 97.8 F (36.6 C) (Oral)   Ht 6\' 1"  (1.854 m)   Wt 188 lb (85.3 kg)   SpO2 96%   BMI 24.80 kg/m  Well-developed well-nourished male no acute distress vital signs stable he is afebrile examination HEENT is pertinent he has early bilateral cataracts. States she saw Dr. Bing Plume recently who seeing him every 6 months. He was notices eyes last eye exam to have something wrong with his retina. He  went to a retinal specialist who said it was old trauma.  Neck was supple thyroid not enlarged no carotid bruit. Cardiopulmonary exam normal. Abdominal exam normal. Genitalia normal circumcised male scar right inguinal area from previous herniorrhaphy. Rectal normal stool guaiac-negative prostate smooth nonnodular 1+ BPH. Extremities normal skin normal peripheral pulses normal  #1 hyperlipidemia.... Continue current meds check labs  #2 mild ED........... continue generic Viagra  #3 lumbar disc disease........... currently being evaluated by neurosurgery with MRI  #4 history of colon polyps........ follow-up colonoscopy 2021

## 2017-11-10 ENCOUNTER — Encounter: Payer: Self-pay | Admitting: Sports Medicine

## 2017-11-11 ENCOUNTER — Other Ambulatory Visit: Payer: Medicare HMO

## 2017-11-12 ENCOUNTER — Encounter: Payer: Self-pay | Admitting: Family Medicine

## 2017-11-14 ENCOUNTER — Ambulatory Visit: Payer: Medicare HMO | Admitting: Sports Medicine

## 2017-11-17 ENCOUNTER — Other Ambulatory Visit: Payer: Medicare HMO

## 2017-11-19 ENCOUNTER — Ambulatory Visit: Payer: Medicare HMO | Admitting: Sports Medicine

## 2017-11-22 ENCOUNTER — Ambulatory Visit
Admission: RE | Admit: 2017-11-22 | Discharge: 2017-11-22 | Disposition: A | Discharge status: Home / Self Care | Payer: Medicare HMO | Source: Ambulatory Visit | Attending: Sports Medicine | Admitting: Sports Medicine

## 2017-11-22 DIAGNOSIS — M48061 Spinal stenosis, lumbar region without neurogenic claudication: Secondary | ICD-10-CM | POA: Diagnosis not present

## 2017-11-22 DIAGNOSIS — M4726 Other spondylosis with radiculopathy, lumbar region: Secondary | ICD-10-CM

## 2017-11-24 ENCOUNTER — Other Ambulatory Visit: Payer: Medicare HMO

## 2017-11-26 ENCOUNTER — Ambulatory Visit: Payer: Medicare HMO | Admitting: Sports Medicine

## 2017-11-26 ENCOUNTER — Encounter: Payer: Self-pay | Admitting: Sports Medicine

## 2017-11-26 VITALS — BP 130/82 | HR 64 | Ht 73.0 in | Wt 189.4 lb

## 2017-11-26 DIAGNOSIS — M4726 Other spondylosis with radiculopathy, lumbar region: Secondary | ICD-10-CM | POA: Diagnosis not present

## 2017-11-26 NOTE — Patient Instructions (Signed)

## 2017-11-26 NOTE — Progress Notes (Signed)
Marcus Reynolds. Marcus Reynolds, Marcus Reynolds at [facility] [phone]  Marcus Reynolds - 72 y.o. male MRN [id number]  Date of birth: 1945-10-12  Visit Date: 11/26/2017  PCP: Marcus Cookey, MD   Referred by: Marcus Cookey, MD  Scribe(s) for today's visit: Marcus Reynolds, Marcus Reynolds  SUBJECTIVE:  Marcus Reynolds is here for Follow-up (LBP, review MRI)   Notes from 08/04/17: His hamstring pain symptoms INITIALLY: Began in December 2018 and started about 3 days after shoveling snow.  Described as moderate tightness, radiating to calves Worsened with activity, sitting. Pain is also worse in the morning. Improved with heat, sitting on pillow.  Additional associated symptoms include: He denies sciatic nerve pain, groin pain, hip pain. He did have gluteal pain but that resolved for steroid injection.    At this time symptoms are improving compared to onset, he reports that pain improves and then gets worse again.  He has been going to PT (x 5 weeks) and hasn't noticed much relief. He has tried dry needling with some relief. He has tried massage therapy. He saw Marcus Reynolds last week and received steroid injection, he got minimal relief. He has been taking IBU with some relief. He gets the most relief from sitting on heating pad.  No recent xray or MR  09/24/17: Compared to the last office visit on 08/04/17, his previously described low back pain and radicular h/s symptoms are improving and feels about 90% improved.  He states that prior to seeing Dr. Ernestina Reynolds, he felt about 80% but was still having proximal h/s issues.  He states that since the injection, the h/s pain/symptoms have resolved and feels an additional 10% improved.  He states that he is back to his normal routine in terms of exercise and working out. Current symptoms are mild & are nonradiating.  However, he does still note some tightness in his B hamstrings. He has been taking Gabapentin 300mg  qd at night before  bed.  He also takes IBU prn (about once/week).  He saw Dr. Ernestina Reynolds on 09/09/17 for an L5-S1 epidural.  He's tried dry needling and PT in the past w/ little relief.  He has been doing his own HEP that includes glute bridges and piriformis stretching.  He states that he will be going out of the country [place] 3 weeks starting next week.  10/30/2017: Compared to the last office visit on 09/24/17, his previously described symptoms are worsening Pt was on a flight for 10 hours 2 weeks ago. Current symptoms are moderate & are radiating to left upper thigh, having some tingling down left leg, symptoms are worse in the morning. He has been walking, exercising at th gym once a week. Pt is still taking Gabapentin mainly at night helping to sleep but not sure if helping the pain. Also using Ibuprofen 200 mg TID with relief and sitting on a heating pain. Pt would like to discuss having MRI.  11/26/2017: Compared to the last office visit, his previously described symptoms show no change, he has good days and bad days.  Current symptoms are moderate & are radiating to both legs, L>R.  He has been taking IBU and Gabapentin with some relief. He is still using heating pad prn with some relief.    REVIEW OF SYSTEMS: Denies night time disturbances. Denies fevers, chills, or night sweats. Denies unexplained weight loss. Denies personal history of cancer. Denies changes in bowel or bladder habits. Denies recent unreported falls. Denies new  or worsening dyspnea or wheezing. Denies headaches or dizziness.  Reports numbness, tingling or weakness  In the extremities.  Denies dizziness or presyncopal episodes Denies lower extremity edema    HISTORY & PERTINENT PRIOR DATA:  Prior History reviewed and updated per electronic medical record.  Significant/pertinent history, findings, studies include:  reports that he has never smoked. He has never used smokeless tobacco. No results for input(s): HGBA1C, LABURIC, CREATINE  in the last 8760 hours. No specialty comments available. No problems updated.  OBJECTIVE:  VS:  HT:6\' 1"  (185.4 cm)   WT:189 lb 6.4 oz (85.9 kg)  BMI:24.99    BP:130/82  HR:64bpm  TEMP: ( )  RESP:96 %   PHYSICAL EXAM: CONSTITUTIONAL: Well-developed, Well-nourished and In no acute distress Alert & appropriately interactive. and Not depressed or anxious appearing. Respiratory: No increased work of breathing.  Trachea Midline EYES: Pupils are equal., EOM intact without nystagmus. and No scleral icterus.  Lower extremities: Warm and well perfused NEURO: unremarkable  MSK Exam: Back:: . He is able to heel toe walk without difficulty.   . He has good internal and external rotation of bilateral hips. . Moderate's over the paraspinal musculature.  PROCEDURES & DATA REVIEWED:  MRI reviewed today with focal findings   ASSESSMENT   1. Osteoarthritis of spine with radiculopathy, lumbar region     PLAN:  Should do well with physical therapy as well as epidural steroid injection.  We will plan to check in with him in 8 weeks.  If any lack of improvement that time will need to consider neurosurgical evaluation  Links to [employer] provided today per Patient Instructions.  These exercises were developed by Marcus Reynolds, DC with a strong emphasis on core neuromuscular reducation and postural realignment through body-weight exercises.  Discussed the foundation of treatment for this condition is physical therapy and/or daily (5-6 days/week) therapeutic exercises, focusing on core strengthening, coordination, neuromuscular control/reeducation.  Referral to physical therapy placed  >50% of this 25 minute visit spent in direct patient counseling and/or coordination of care.  Discussion was focused on education regarding the in discussing the pathoetiology and anticipated clinical course of the above condition.  Orders Placed This Encounter  Procedures  . Ambulatory referral  to Physical Medicine Rehab  . Ambulatory referral to Physical Therapy  No orders of the defined types were placed in this encounter.    Follow-up: Return in about 8 weeks (around 01/21/2018).      Please see additional documentation for Objective, Assessment and Plan sections. Pertinent additional documentation may be included in corresponding procedure notes, imaging studies, problem based documentation and patient instructions. Please see these sections of the encounter for additional information regarding this visit.  Marcus Reynolds/ATC served as Education administrator during this visit. History, Physical, and Plan performed by medical provider. Documentation and orders reviewed and attested to.      Gerda Diss, [facility] Physician

## 2017-12-01 ENCOUNTER — Ambulatory Visit: Payer: Medicare HMO | Admitting: Physical Therapy

## 2017-12-01 ENCOUNTER — Encounter: Payer: Self-pay | Admitting: Physical Therapy

## 2017-12-01 DIAGNOSIS — M5442 Lumbago with sciatica, left side: Secondary | ICD-10-CM | POA: Diagnosis not present

## 2017-12-01 NOTE — Patient Instructions (Signed)
Access Code: [email or web address]  URL: [email or web address]  Date: 12/01/2017  Prepared by: Lyndee Hensen   Exercises  Supine Single Knee to Chest - 3 reps - 30 hold - 2x daily  Supine Lower Trunk Rotation - 10 reps - 2 sets - 10 hold - 2x daily  Hip Flexor Stretch at Edge of Bed - 3 reps - 30 hold - 2x daily  Seated Hamstring Stretch - 3 reps - 30 hold - 2x daily  Seated Flexion Stretch - 3 reps - 30 hold - 2x daily

## 2017-12-02 NOTE — Therapy (Signed)
[facility] [address] [place], [place], [address] Phone: [phone]   Fax:  [phone]  Physical Therapy Evaluation  Patient Details  Name: Marcus Reynolds MRN: [id number] Date of Birth: April 08, 1946 Referring Provider: Teresa Coombs   Encounter Date: 12/01/2017  PT End of Session - 12/02/17 1304    Visit Number  1    Number of Visits  12    Date for PT Re-Evaluation  01/12/18    PT Start Time  8341    PT Stop Time  1345    PT Time Calculation (min)  40 min    Activity Tolerance  Patient tolerated treatment well;Patient limited by pain    Behavior During Therapy  [facility] for tasks assessed/performed       Past Medical History:  Diagnosis Date  . Personal history of colonic polyps 12/25/2010    Past Surgical History:  Procedure Laterality Date  . COLONOSCOPY  12/25/2010   internal hemorrhoids  . COLONOSCOPY W/ POLYPECTOMY  2007   [place], [place]     right side  . TONSILECTOMY, ADENOIDECTOMY, BILATERAL MYRINGOTOMY AND TUBES      There were no vitals filed for this visit.   Subjective Assessment - 12/01/17 1304    Subjective  Pt states iniital pain onset in December, possibly shoveling snow. He had cortisone injection and PT that did help initially. He has also had lumbar injections. He is retired but teaches to seniors. He denies tingling or weakness, no numbness. Denies pain in his back., but has pain in L thigh that is very bothersome.     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  Yes    Pain Score  5     Pain Location  Leg    Pain Orientation  Right;Left    Pain Descriptors / Indicators  Aching;Shooting    Pain Type  Chronic pain    Pain Onset  More than a month ago    Pain Frequency  Intermittent    Aggravating Factors   Squatting,  first thing in AM     Pain Relieving Factors  Heat,           [facility] PT Assessment - 12/01/17 1318      Assessment   Medical Diagnosis  Back pain/     Referring Provider  Teresa Coombs    Hand Dominance  Left    Next MD Visit  July 18th spinal injection    Prior Therapy  Yes      Precautions   Precautions  None      Balance Screen   Has the patient fallen in the past 6 months  No      Prior Function   Level of Independence  Independent      Cognition   Overall Cognitive Status  Within Functional Limits for tasks assessed      Posture/Postural Control   Posture Comments  R scoliosis curve in thoracic spine, prominent R paraspinals       ROM / Strength   AROM / PROM / Strength  AROM;Strength      AROM   Overall AROM Comments  Lumbar: Flexion: moderate/significant limitation;  Extension: moderate limitation; SB: mild/moderate limitation;  Hips: mild limitations bilaterally, Extension: moderate limitation bilaterally;       Strength   Overall Strength Comments  Hips: 4/5;  Core: 3/5;       Palpation   Palpation comment  Significant tightness in Bil Hamstrings, quads, mild for hip rotators      Special Tests   Other special tests  90/90 hamstring: R: -55, L: -45;  Negative radicular symptoms, Neg SLR;  Increased pain with repeated lumbar extension;                 Objective measurements completed on examination: See above findings.      [facility] Adult PT Treatment/Exercise - 12/01/17 1338      Exercises   Exercises  Lumbar      Lumbar Exercises: Stretches   Active Hamstring Stretch  3 reps;30 seconds    Active Hamstring Stretch Limitations  seated    Single Knee to Chest Stretch  30 seconds;2 reps;Right;Left    Lower Trunk Rotation  5 reps;10 seconds    Hip Flexor Stretch  2 reps;30 seconds off edge of table;     Other Lumbar Stretch Exercise  Seated lumbar flexion 30 sec x 2;              PT Education - 12/02/17 1304    Education Details  PT POC, HEP    Person(s) Educated  Patient    Methods   Explanation;Demonstration;Handout;Verbal cues    Comprehension  Verbalized understanding;Need further instruction;Verbal cues required       PT Short Term Goals - 12/02/17 1308      PT SHORT TERM GOAL #1   Title  Pt to be independent with initial HEP, with focus on lumbar flexion    Time  2    Period  Weeks    Status  New    Target Date  12/16/17        PT Long Term Goals - 12/02/17 1309      PT LONG TERM GOAL #1   Title  Pt to report decreased pain in low back and L LE, to 0-3/10 with standing activity, for at least 30 min    Time  6    Period  Weeks    Status  New    Target Date  01/13/18      PT LONG TERM GOAL #2   Title  Pt to report ability for ambulation up to 2 miles with pain no greater than 3/10    Time  6    Period  Weeks    Status  New    Target Date  01/13/18      PT LONG TERM GOAL #3   Title  Pt to be independent with long term HEP for mobility and strengthening.     Time  6    Period  Weeks    Status  New    Target Date  01/13/18      PT LONG TERM GOAL #4   Title  Pt to demo ability for improved lumbar Flexion ROM to have only minimal deficit, to improve ability for ADLs and IADLS.     Time  6    Period  Weeks    Status  New    Target Date  01/13/18             Plan - 12/02/17 1321    Clinical Impression Statement  Pt presents with primary complaint of increased pain in low back and into L LE. He has symptoms consistent with stenosis. Pt with significant stiffness in back and hips, and has lack of ROM.  He has increased pain with standing, walking, and has decreased endurance for sustaining activity due to pain. Pt with mild strength loss in hips and core. He has lack of effective HEP for his Dx, and will benefit from education on this. Pt to benefit from skilled PT to improve deficits and decrease pain, will focus on flexion directional preference, mobility, and strenthening.     Clinical Presentation  Stable    Clinical Decision Making  Low     Rehab Potential  Good    PT Frequency  2x / week    PT Duration  6 weeks    PT Treatment/Interventions  Ultrasound;ADLs/Self Care Home Management;Cryotherapy;Electrical Stimulation;Iontophoresis 4mg /ml Dexamethasone;Moist Heat;Therapeutic activities;Functional mobility training;Stair training;Gait training;Therapeutic exercise;Balance training;Neuromuscular re-education;Patient/family education;Dry needling;Orthotic Fit/Training;Manual techniques;Taping    Consulted and Agree with Plan of Care  Patient       Patient will benefit from skilled therapeutic intervention in order to improve the following deficits and impairments:  Abnormal gait, Decreased endurance, Hypomobility, Decreased activity tolerance, Decreased strength, Pain, Difficulty walking, Decreased mobility, Decreased range of motion, Improper body mechanics, Postural dysfunction, Impaired flexibility  Visit Diagnosis: Acute bilateral low back pain with left-sided sciatica     Problem List Patient Active Problem List   Diagnosis Date Noted  . Osteoarthritis of spine with radiculopathy, lumbar region 08/04/2017  . Routine general medical examination at a health care facility 09/23/2013  . ED (erectile dysfunction) 07/23/2011  . History of colonic polyps 12/25/2010  . HYPERTRIGLYCERIDEMIA 06/13/2009    Lyndee Hensen, PT, DPT 1:26 PM  12/02/17    [place], [place], [address] Phone: [phone]   Fax:  [phone]  Name: Kunio Cummiskey MRN: [id number] Date of Birth: 15-Jan-1946

## 2017-12-08 ENCOUNTER — Encounter: Payer: Self-pay | Admitting: Physical Therapy

## 2017-12-08 ENCOUNTER — Ambulatory Visit: Payer: Medicare HMO | Admitting: Physical Therapy

## 2017-12-08 DIAGNOSIS — M5442 Lumbago with sciatica, left side: Secondary | ICD-10-CM

## 2017-12-08 NOTE — Therapy (Signed)
[facility] [address] [place], [place], [address] Phone: [phone]   Fax:  [phone]  Physical Therapy Treatment  Patient Details  Name: Marcus Reynolds MRN: [id number] Date of Birth: [birth year] Referring Provider: Teresa Coombs   Encounter Date: 12/08/2017  PT End of Session - 12/08/17 1259    Visit Number  2    Number of Visits  12    Date for PT Re-Evaluation  01/12/18    PT Start Time  1301    PT Stop Time  1348    PT Time Calculation (min)  47 min    Activity Tolerance  Patient tolerated treatment well;Patient limited by pain    Behavior During Therapy  [facility] for tasks assessed/performed       Past Medical History:  Diagnosis Date  . Personal history of colonic polyps 12/25/2010    Past Surgical History:  Procedure Laterality Date  . COLONOSCOPY  12/25/2010   internal hemorrhoids  . COLONOSCOPY W/ POLYPECTOMY  2007   [place], [place]     right side  . TONSILECTOMY, ADENOIDECTOMY, BILATERAL MYRINGOTOMY AND TUBES      There were no vitals filed for this visit.  Subjective Assessment - 12/08/17 1437    Subjective  Pt states he has been doing HEP. Continues to be sore in Bil glute and thigh regoin.     Currently in Pain?  Yes    Pain Score  5     Pain Location  Leg    Pain Orientation  Right;Left    Pain Descriptors / Indicators  Aching;Shooting    Pain Type  Chronic pain    Pain Onset  More than a month ago    Pain Frequency  Intermittent                       [facility] Adult PT Treatment/Exercise - 12/08/17 1259      Exercises   Exercises  Lumbar      Lumbar Exercises: Stretches   Active Hamstring Stretch  3 reps;30 seconds    Active Hamstring Stretch Limitations  seated    Single Knee to Chest Stretch  30 seconds;2 reps;Right;Left    Lower Trunk Rotation  5 reps;10 seconds    Hip Flexor Stretch  2 reps;30 seconds off edge of table;     Pelvic Tilt  20 reps    Other  Lumbar Stretch Exercise  Seated lumbar flexion 30 sec x 2;       Lumbar Exercises: Aerobic   Stationary Bike  L2 x 8 min;       Lumbar Exercises: Supine   Ab Set  10 reps    Clam  20 reps    Clam Limitations  with TA and RTB    Bent Knee Raise  20 reps      Manual Therapy   Manual Therapy  Passive ROM;Joint mobilization    Joint Mobilization  For hip extension Bil    Passive ROM  For L hip IR/ER;  manual quad stretch bil; manual hip flexor stretch bil;                PT Short Term Goals - 12/02/17 1308      PT SHORT TERM GOAL #1   Title  Pt to be independent with initial HEP, with focus on lumbar flexion    Time  2    Period  Weeks    Status  New    Target Date  12/16/17        PT Long Term Goals - 12/02/17 1309      PT LONG TERM GOAL #1   Title  Pt to report decreased pain in low back and L LE, to 0-3/10 with standing activity, for at least 30 min    Time  6    Period  Weeks    Status  New    Target Date  01/13/18      PT LONG TERM GOAL #2   Title  Pt to report ability for ambulation up to 2 miles with pain no greater than 3/10    Time  6    Period  Weeks    Status  New    Target Date  01/13/18      PT LONG TERM GOAL #3   Title  Pt to be independent with long term HEP for mobility and strengthening.     Time  6    Period  Weeks    Status  New    Target Date  01/13/18      PT LONG TERM GOAL #4   Title  Pt to demo ability for improved lumbar Flexion ROM to have only minimal deficit, to improve ability for ADLs and IADLS.     Time  6    Period  Weeks    Status  New    Target Date  01/13/18            Plan - 12/08/17 1439    Clinical Impression Statement  THer ex done today for lumbar and hip mobility, education done for mechaincs for HEP. Started light core strengthening, pt with difficulty with TA contraction, added to HEP, but will require further education for correct mechanics. Plan to progress strength as tolerated. Pt with increased pain  with lumbar extension motions today, standing from squat position, transfers , and lowering leg to table in supine.     Rehab Potential  Good    PT Frequency  2x / week    PT Duration  6 weeks    PT Treatment/Interventions  Ultrasound;ADLs/Self Care Home Management;Cryotherapy;Electrical Stimulation;Iontophoresis 4mg /ml Dexamethasone;Moist Heat;Therapeutic activities;Functional mobility training;Stair training;Gait training;Therapeutic exercise;Balance training;Neuromuscular re-education;Patient/family education;Dry needling;Orthotic Fit/Training;Manual techniques;Taping    Consulted and Agree with Plan of Care  Patient       Patient will benefit from skilled therapeutic intervention in order to improve the following deficits and impairments:  Abnormal gait, Decreased endurance, Hypomobility, Decreased activity tolerance, Decreased strength, Pain, Difficulty walking, Decreased mobility, Decreased range of motion, Improper body mechanics, Postural dysfunction, Impaired flexibility  Visit Diagnosis: Acute bilateral low back pain with left-sided sciatica     Problem List Patient Active Problem List   Diagnosis Date Noted  . Osteoarthritis of spine with radiculopathy, lumbar region 08/04/2017  . Routine general medical examination at a health care facility 09/23/2013  . ED (erectile dysfunction) 07/23/2011  . History of colonic polyps 12/25/2010  . HYPERTRIGLYCERIDEMIA 06/13/2009   Lyndee Hensen, PT, DPT 2:49 PM  12/08/17    [facility] [place], [place], [address] Phone: [phone]   Fax:  [phone]  Name: Marcus Reynolds MRN: [id number] Date of Birth: [birth year]

## 2017-12-12 ENCOUNTER — Encounter: Payer: Self-pay | Admitting: Physical Therapy

## 2017-12-12 ENCOUNTER — Ambulatory Visit: Payer: Medicare HMO | Admitting: Physical Therapy

## 2017-12-12 DIAGNOSIS — M4726 Other spondylosis with radiculopathy, lumbar region: Secondary | ICD-10-CM | POA: Diagnosis not present

## 2017-12-12 DIAGNOSIS — M5442 Lumbago with sciatica, left side: Secondary | ICD-10-CM

## 2017-12-12 NOTE — Therapy (Signed)
[facility] [address] [place], [place], [address] Phone: [phone]   Fax:  [phone]  Physical Therapy Treatment  Patient Details  Name: Marcus Reynolds MRN: [id number] Date of Birth: [birth year] Referring Provider: Teresa Coombs   Encounter Date: 12/12/2017  PT End of Session - 12/12/17 1311    Visit Number  3    Number of Visits  12    Date for PT Re-Evaluation  01/12/18    PT Start Time  9935    PT Stop Time  1355    PT Time Calculation (min)  51 min    Activity Tolerance  Patient tolerated treatment well;Patient limited by pain    Behavior During Therapy  [facility] for tasks assessed/performed       Past Medical History:  Diagnosis Date  . Personal history of colonic polyps 12/25/2010    Past Surgical History:  Procedure Laterality Date  . COLONOSCOPY  12/25/2010   internal hemorrhoids  . COLONOSCOPY W/ POLYPECTOMY  2007   [place], [place]     right side  . TONSILECTOMY, ADENOIDECTOMY, BILATERAL MYRINGOTOMY AND TUBES      There were no vitals filed for this visit.  Subjective Assessment - 12/12/17 1310    Subjective  Pt states soreness in his legs/glutes today. He has been doing stretching, has found some relief with seated lumbar flexion.     Currently in Pain?  Yes    Pain Score  5     Pain Location  Leg    Pain Orientation  Right;Left    Pain Descriptors / Indicators  Aching;Shooting    Pain Type  Chronic pain    Pain Onset  More than a month ago    Pain Frequency  Intermittent                       [facility] Adult PT Treatment/Exercise - 12/12/17 0001      Exercises   Exercises  Lumbar      Lumbar Exercises: Stretches   Active Hamstring Stretch  3 reps;30 seconds    Active Hamstring Stretch Limitations  seated    Single Knee to Chest Stretch  30 seconds;2 reps;Right;Left    Lower Trunk Rotation  --    Hip Flexor Stretch  -- off edge of table;     Pelvic Tilt  20 reps    Other Lumbar Stretch Exercise  Hip IR across body 30 sec x2 on L/       Lumbar Exercises: Aerobic   Stationary Bike  L2 x 8 min;       Lumbar Exercises: Standing   Other Standing Lumbar Exercises  Mini squats x10; Sit to stand x10;       Lumbar Exercises: Seated   Long Arc Quad on Chair  10 reps;Both    LAQ on Chair Limitations  with x10 ankle pumps on last rep.       Lumbar Exercises: Supine   Ab Set  --    Clam  20 reps    Clam Limitations  with TA and GTB    Bent Knee Raise  20 reps      Manual Therapy   Manual Therapy  Passive ROM;Joint mobilization    Joint Mobilization  For hip extension Bil    Passive ROM  For L hip IR/ER;  manual quad stretch bil; manual hip flexor stretch bil;  PT Short Term Goals - 12/02/17 1308      PT SHORT TERM GOAL #1   Title  Pt to be independent with initial HEP, with focus on lumbar flexion    Time  2    Period  Weeks    Status  New    Target Date  12/16/17        PT Long Term Goals - 12/02/17 1309      PT LONG TERM GOAL #1   Title  Pt to report decreased pain in low back and L LE, to 0-3/10 with standing activity, for at least 30 min    Time  6    Period  Weeks    Status  New    Target Date  01/13/18      PT LONG TERM GOAL #2   Title  Pt to report ability for ambulation up to 2 miles with pain no greater than 3/10    Time  6    Period  Weeks    Status  New    Target Date  01/13/18      PT LONG TERM GOAL #3   Title  Pt to be independent with long term HEP for mobility and strengthening.     Time  6    Period  Weeks    Status  New    Target Date  01/13/18      PT LONG TERM GOAL #4   Title  Pt to demo ability for improved lumbar Flexion ROM to have only minimal deficit, to improve ability for ADLs and IADLS.     Time  6    Period  Weeks    Status  New    Target Date  01/13/18            Plan - 12/12/17 1357    Clinical Impression Statement  Pt with several questions about what do do for  HEP, directed to do only exercises prescribed for HEP on handout. Educated at length on proper mechanics for ther ex today. Ther ex done for lumbar and hip mobility, as well as light strengthening. Recommend continued care.     Rehab Potential  Good    PT Frequency  2x / week    PT Duration  6 weeks    PT Treatment/Interventions  Ultrasound;ADLs/Self Care Home Management;Cryotherapy;Electrical Stimulation;Iontophoresis 4mg /ml Dexamethasone;Moist Heat;Therapeutic activities;Functional mobility training;Stair training;Gait training;Therapeutic exercise;Balance training;Neuromuscular re-education;Patient/family education;Dry needling;Orthotic Fit/Training;Manual techniques;Taping    Consulted and Agree with Plan of Care  Patient       Patient will benefit from skilled therapeutic intervention in order to improve the following deficits and impairments:  Abnormal gait, Decreased endurance, Hypomobility, Decreased activity tolerance, Decreased strength, Pain, Difficulty walking, Decreased mobility, Decreased range of motion, Improper body mechanics, Postural dysfunction, Impaired flexibility  Visit Diagnosis: Acute bilateral low back pain with left-sided sciatica  Osteoarthritis of spine with radiculopathy, lumbar region     Problem List Patient Active Problem List   Diagnosis Date Noted  . Osteoarthritis of spine with radiculopathy, lumbar region 08/04/2017  . Routine general medical examination at a health care facility 09/23/2013  . ED (erectile dysfunction) 07/23/2011  . History of colonic polyps 12/25/2010  . HYPERTRIGLYCERIDEMIA 06/13/2009    Lyndee Hensen, PT, DPT 2:00 PM  12/12/17    [facility] [place] [place], [place], [address] Phone: [phone]   Fax:  [phone]  Name: Marcus Reynolds MRN: [id number] Date of Birth: February 17, 1946

## 2017-12-15 ENCOUNTER — Ambulatory Visit: Payer: Medicare HMO | Admitting: Physical Therapy

## 2017-12-15 ENCOUNTER — Encounter: Payer: Self-pay | Admitting: Physical Therapy

## 2017-12-15 DIAGNOSIS — M5442 Lumbago with sciatica, left side: Secondary | ICD-10-CM

## 2017-12-15 NOTE — Therapy (Signed)
[facility] [address] [place], [place], [address] Phone: [phone]   Fax:  [phone]  Physical Therapy Treatment  Patient Details  Name: Marcus Reynolds MRN: [id number] Date of Birth: 21-Sep-1945 Referring Provider: Teresa Coombs   Encounter Date: 12/15/2017  PT End of Session - 12/15/17 1655    Visit Number  4    Number of Visits  12    Date for PT Re-Evaluation  01/12/18    PT Start Time  1340    PT Stop Time  1430    PT Time Calculation (min)  50 min    Activity Tolerance  Patient tolerated treatment well;Patient limited by pain    Behavior During Therapy  [facility] for tasks assessed/performed       Past Medical History:  Diagnosis Date  . Personal history of colonic polyps 12/25/2010    Past Surgical History:  Procedure Laterality Date  . COLONOSCOPY  12/25/2010   internal hemorrhoids  . COLONOSCOPY W/ POLYPECTOMY  2007   [place], [place]     right side  . TONSILECTOMY, ADENOIDECTOMY, BILATERAL MYRINGOTOMY AND TUBES      There were no vitals filed for this visit.  Subjective Assessment - 12/15/17 1528    Subjective  Pt states slight improvement of pain He has been able to walk for only 30 min before pain increases.     Currently in Pain?  Yes    Pain Score  4     Pain Location  Leg    Pain Orientation  Right;Left    Pain Descriptors / Indicators  Aching;Shooting    Pain Type  Chronic pain    Pain Onset  More than a month ago    Pain Frequency  Intermittent                       [facility] Adult PT Treatment/Exercise - 12/15/17 1342      Exercises   Exercises  Lumbar      Lumbar Exercises: Stretches   Active Hamstring Stretch  3 reps;30 seconds    Active Hamstring Stretch Limitations  seated    Single Knee to Chest Stretch  30 seconds;2 reps;Right;Left    Hip Flexor Stretch  --    Pelvic Tilt  20 reps    Other Lumbar Stretch Exercise  --      Lumbar Exercises: Aerobic   Stationary Bike  L2 x 8 min;       Lumbar Exercises: Standing   Other Standing Lumbar Exercises  Mini squats x10; Sit to stand x10;     Other Standing Lumbar Exercises  Hip ABD 2x10 bil;        Lumbar Exercises: Seated   Long Arc Quad on Chair  Both;20 reps    LAQ on Chair Limitations  x10 ankle pumps on last rep.       Lumbar Exercises: Supine   Clam  20 reps    Clam Limitations  with TA and GTB    Bent Knee Raise  --    Dead Bug  20 reps    Straight Leg Raise  5 reps    Straight Leg Raises Limitations  with TA      Lumbar Exercises: Prone   Other Prone Lumbar Exercises  Prone hip ext x10 bil;     Other Prone Lumbar Exercises  Prone quad stretch 30 sec x3 bil; (for HEP)  Manual Therapy   Manual Therapy  Passive ROM;Joint mobilization    Joint Mobilization  For hip extension Bil    Passive ROM   manual quad stretch bil; manual hip flexor stretch bil;                PT Short Term Goals - 12/15/17 1657      PT SHORT TERM GOAL #1   Title  Pt to be independent with initial HEP, with focus on lumbar flexion    Time  2    Period  Weeks    Status  Achieved        PT Long Term Goals - 12/02/17 1309      PT LONG TERM GOAL #1   Title  Pt to report decreased pain in low back and L LE, to 0-3/10 with standing activity, for at least 30 min    Time  6    Period  Weeks    Status  New    Target Date  01/13/18      PT LONG TERM GOAL #2   Title  Pt to report ability for ambulation up to 2 miles with pain no greater than 3/10    Time  6    Period  Weeks    Status  New    Target Date  01/13/18      PT LONG TERM GOAL #3   Title  Pt to be independent with long term HEP for mobility and strengthening.     Time  6    Period  Weeks    Status  New    Target Date  01/13/18      PT LONG TERM GOAL #4   Title  Pt to demo ability for improved lumbar Flexion ROM to have only minimal deficit, to improve ability for ADLs and IADLS.     Time  6    Period  Weeks    Status   New    Target Date  01/13/18            Plan - 12/15/17 1657    Clinical Impression Statement  Pt requires max cueing for correct performace of ther ex. He has mild increase in lumbar pain with hip extension today, will benefit from continued work on this. Pt improving with mechanics for mini squat, will benefit from instruction on hip hinge, for improved ability and pain with fwd bending. He has tendency for aggravated pain with lumbar or hip extension. Pt improving with ability for ther ex and strengthening. Plan to progress as tolerated.     Rehab Potential  Good    PT Frequency  2x / week    PT Duration  6 weeks    PT Treatment/Interventions  Ultrasound;ADLs/Self Care Home Management;Cryotherapy;Electrical Stimulation;Iontophoresis 4mg /ml Dexamethasone;Moist Heat;Therapeutic activities;Functional mobility training;Stair training;Gait training;Therapeutic exercise;Balance training;Neuromuscular re-education;Patient/family education;Dry needling;Orthotic Fit/Training;Manual techniques;Taping    Consulted and Agree with Plan of Care  Patient       Patient will benefit from skilled therapeutic intervention in order to improve the following deficits and impairments:  Abnormal gait, Decreased endurance, Hypomobility, Decreased activity tolerance, Decreased strength, Pain, Difficulty walking, Decreased mobility, Decreased range of motion, Improper body mechanics, Postural dysfunction, Impaired flexibility  Visit Diagnosis: Acute bilateral low back pain with left-sided sciatica     Problem List Patient Active Problem List   Diagnosis Date Noted  . Osteoarthritis of spine with radiculopathy, lumbar region 08/04/2017  . Routine general medical examination at a health care  facility 09/23/2013  . ED (erectile dysfunction) 07/23/2011  . History of colonic polyps 12/25/2010  . HYPERTRIGLYCERIDEMIA 06/13/2009    Lyndee Hensen, PT, DPT 5:00 PM  12/15/17    [place], [place], [address] Phone: [phone]   Fax:  [phone]  Name: Marcus Reynolds MRN: [id number] Date of Birth: [birth year]

## 2017-12-18 ENCOUNTER — Encounter: Payer: Self-pay | Admitting: Physical Therapy

## 2017-12-18 ENCOUNTER — Ambulatory Visit: Payer: Medicare HMO | Admitting: Physical Therapy

## 2017-12-18 DIAGNOSIS — M5442 Lumbago with sciatica, left side: Secondary | ICD-10-CM | POA: Diagnosis not present

## 2017-12-18 NOTE — Therapy (Signed)
[facility] [address] [place], [place], [address] Phone: [phone]   Fax:  [phone]  Physical Therapy Treatment  Patient Details  Name: Marcus Reynolds MRN: [id number] Date of Birth: [birth year] Referring Provider: Teresa Coombs   Encounter Date: 12/18/2017  PT End of Session - 12/18/17 1424    Visit Number  5    Number of Visits  12    Date for PT Re-Evaluation  01/12/18    PT Start Time  1302    PT Stop Time  2409    PT Time Calculation (min)  45 min    Activity Tolerance  Patient tolerated treatment well;Patient limited by pain    Behavior During Therapy  [facility] for tasks assessed/performed       Past Medical History:  Diagnosis Date  . Personal history of colonic polyps 12/25/2010    Past Surgical History:  Procedure Laterality Date  . COLONOSCOPY  12/25/2010   internal hemorrhoids  . COLONOSCOPY W/ POLYPECTOMY  2007   [place], [place]     right side  . TONSILECTOMY, ADENOIDECTOMY, BILATERAL MYRINGOTOMY AND TUBES      There were no vitals filed for this visit.  Subjective Assessment - 12/18/17 1423    Subjective  Pt with no new complaints today     Currently in Pain?  Yes    Pain Score  3     Pain Location  Leg    Pain Orientation  Right;Left    Pain Descriptors / Indicators  Aching    Pain Type  Chronic pain    Pain Onset  More than a month ago    Pain Frequency  Intermittent                       [facility] Adult PT Treatment/Exercise - 12/18/17 1300      Exercises   Exercises  Lumbar      Lumbar Exercises: Stretches   Active Hamstring Stretch  3 reps;30 seconds    Active Hamstring Stretch Limitations  Supine with strap and ankle pump    Single Knee to Chest Stretch  --    Lower Trunk Rotation  5 reps;10 seconds    Pelvic Tilt  20 reps      Lumbar Exercises: Aerobic   Stationary Bike  L2 x 8 min;       Lumbar Exercises: Standing   Other Standing Lumbar Exercises   Mini squats x10;  hip hinge x15 (squat mechanics);     Other Standing Lumbar Exercises  Hip ABD 2x10 bil;        Lumbar Exercises: Seated   Long Arc Quad on Chair  --    LAQ on Chair Limitations  --      Lumbar Exercises: Supine   Clam  --    Clam Limitations  --    Dead Bug  20 reps    Straight Leg Raise  10 reps    Straight Leg Raises Limitations  with TA      Lumbar Exercises: Prone   Other Prone Lumbar Exercises  --    Other Prone Lumbar Exercises  --      Manual Therapy   Manual Therapy  Passive ROM;Joint mobilization    Joint Mobilization  For hip extension Bil    Passive ROM   manual quad stretch bil; manual hip flexor stretch bil; Manual hamstring stretch ;  PT Education - 12/18/17 1423    Education Details  Education on [employer]) Educated  Patient    Methods  Explanation    Comprehension  Verbalized understanding;Need further instruction       PT Short Term Goals - 12/15/17 1657      PT SHORT TERM GOAL #1   Title  Pt to be independent with initial HEP, with focus on lumbar flexion    Time  2    Period  Weeks    Status  Achieved        PT Long Term Goals - 12/02/17 1309      PT LONG TERM GOAL #1   Title  Pt to report decreased pain in low back and L LE, to 0-3/10 with standing activity, for at least 30 min    Time  6    Period  Weeks    Status  New    Target Date  01/13/18      PT LONG TERM GOAL #2   Title  Pt to report ability for ambulation up to 2 miles with pain no greater than 3/10    Time  6    Period  Weeks    Status  New    Target Date  01/13/18      PT LONG TERM GOAL #3   Title  Pt to be independent with long term HEP for mobility and strengthening.     Time  6    Period  Weeks    Status  New    Target Date  01/13/18      PT LONG TERM GOAL #4   Title  Pt to demo ability for improved lumbar Flexion ROM to have only minimal deficit, to improve ability for ADLs and IADLS.     Time  6    Period  Weeks     Status  New    Target Date  01/13/18            Plan - 12/18/17 1425    Clinical Impression Statement  Pt with improving mechanics with squat/bending, but still will benefit from continued education on this, to decrease pain in back. Pt with increased stiffness in R hip ext >L, and has poor ability for glute activation bilaterally. Pt requires max instruction and cueing to perform all ther ex. Recommend continued care. Pt having 2nd epidural injection next week.     Rehab Potential  Good    PT Frequency  2x / week    PT Duration  6 weeks    PT Treatment/Interventions  Ultrasound;ADLs/Self Care Home Management;Cryotherapy;Electrical Stimulation;Iontophoresis 4mg /ml Dexamethasone;Moist Heat;Therapeutic activities;Functional mobility training;Stair training;Gait training;Therapeutic exercise;Balance training;Neuromuscular re-education;Patient/family education;Dry needling;Orthotic Fit/Training;Manual techniques;Taping    Consulted and Agree with Plan of Care  Patient       Patient will benefit from skilled therapeutic intervention in order to improve the following deficits and impairments:  Abnormal gait, Decreased endurance, Hypomobility, Decreased activity tolerance, Decreased strength, Pain, Difficulty walking, Decreased mobility, Decreased range of motion, Improper body mechanics, Postural dysfunction, Impaired flexibility  Visit Diagnosis: Acute bilateral low back pain with left-sided sciatica     Problem List Patient Active Problem List   Diagnosis Date Noted  . Osteoarthritis of spine with radiculopathy, lumbar region 08/04/2017  . Routine general medical examination at a health care facility 09/23/2013  . ED (erectile dysfunction) 07/23/2011  . History of colonic polyps 12/25/2010  . HYPERTRIGLYCERIDEMIA 06/13/2009    Lyndee Hensen, PT, DPT  2:28 PM  12/18/17    [place], [place], [address] Phone:  [phone]   Fax:  [phone]  Name: Marcus Reynolds MRN: [id number] Date of Birth: December 23, 1945

## 2017-12-22 ENCOUNTER — Ambulatory Visit: Payer: Medicare HMO | Admitting: Physical Therapy

## 2017-12-22 ENCOUNTER — Encounter: Payer: Self-pay | Admitting: Physical Therapy

## 2017-12-22 DIAGNOSIS — M5442 Lumbago with sciatica, left side: Secondary | ICD-10-CM

## 2017-12-23 NOTE — Therapy (Signed)
[facility] [address] [place], [place], [address] Phone: [phone]   Fax:  [phone]  Physical Therapy Treatment  Patient Details  Name: Marcus Reynolds MRN: [id number] Date of Birth: 1945/09/16 Referring Provider: Teresa Coombs   Encounter Date: 12/22/2017  PT End of Session - 12/23/17 0951    Visit Number  6    Number of Visits  12    Date for PT Re-Evaluation  01/12/18    PT Start Time  1300    PT Stop Time  1344    PT Time Calculation (min)  44 min    Activity Tolerance  Patient tolerated treatment well;Patient limited by pain    Behavior During Therapy  [facility] for tasks assessed/performed       Past Medical History:  Diagnosis Date  . Personal history of colonic polyps 12/25/2010    Past Surgical History:  Procedure Laterality Date  . COLONOSCOPY  12/25/2010   internal hemorrhoids  . COLONOSCOPY W/ POLYPECTOMY  2007   [place], [place]     right side  . TONSILECTOMY, ADENOIDECTOMY, BILATERAL MYRINGOTOMY AND TUBES      There were no vitals filed for this visit.  Subjective Assessment - 12/23/17 0950    Subjective  Pt states increased activity, standing, bending, up and down yesterday, and had increased pain at end of day. He reports little pain this am.     Currently in Pain?  Yes    Pain Score  2     Pain Location  Back    Pain Orientation  Left;Right    Pain Descriptors / Indicators  Aching    Pain Type  Chronic pain    Pain Onset  More than a month ago    Pain Frequency  Intermittent                       [facility] Adult PT Treatment/Exercise - 12/22/17 1306      Exercises   Exercises  Lumbar      Lumbar Exercises: Stretches   Active Hamstring Stretch  3 reps;30 seconds    Active Hamstring Stretch Limitations  Supine with strap and ankle pump    Lower Trunk Rotation  5 reps;10 seconds    Pelvic Tilt  --      Lumbar Exercises: Aerobic   Stationary Bike  L2 x 8 min;        Lumbar Exercises: Standing   Other Standing Lumbar Exercises  Mini squats x20;     Other Standing Lumbar Exercises  Hip ABD 2x10 bil;  Hip Ext 2x10 bil;       Lumbar Exercises: Supine   Dead Bug  20 reps    Straight Leg Raise  20 reps    Straight Leg Raises Limitations  with TA      Manual Therapy   Manual Therapy  Passive ROM;Joint mobilization    Joint Mobilization  For hip extension Bil    Passive ROM   manual quad stretch bil; manual hip flexor stretch bil; Manual hamstring stretch ;                PT Short Term Goals - 12/15/17 1657      PT SHORT TERM GOAL #1   Title  Pt to be independent with initial HEP, with focus on lumbar flexion    Time  2    Period  Weeks    Status  Achieved  PT Long Term Goals - 12/02/17 1309      PT LONG TERM GOAL #1   Title  Pt to report decreased pain in low back and L LE, to 0-3/10 with standing activity, for at least 30 min    Time  6    Period  Weeks    Status  New    Target Date  01/13/18      PT LONG TERM GOAL #2   Title  Pt to report ability for ambulation up to 2 miles with pain no greater than 3/10    Time  6    Period  Weeks    Status  New    Target Date  01/13/18      PT LONG TERM GOAL #3   Title  Pt to be independent with long term HEP for mobility and strengthening.     Time  6    Period  Weeks    Status  New    Target Date  01/13/18      PT LONG TERM GOAL #4   Title  Pt to demo ability for improved lumbar Flexion ROM to have only minimal deficit, to improve ability for ADLs and IADLS.     Time  6    Period  Weeks    Status  New    Target Date  01/13/18            Plan - 12/23/17 6301    Clinical Impression Statement  Pt improving with ability and strength with ther ex, requiring less cueing for exercises that have been performed repeatedly. Does require max cueing for new exercise mechanics. He is improving with glute activation in standing. Plan to progress strength and stabilization,  and work towards d/c to HEP in next 2 weeks.     Rehab Potential  Good    PT Frequency  2x / week    PT Duration  6 weeks    PT Treatment/Interventions  Ultrasound;ADLs/Self Care Home Management;Cryotherapy;Electrical Stimulation;Iontophoresis 4mg /ml Dexamethasone;Moist Heat;Therapeutic activities;Functional mobility training;Stair training;Gait training;Therapeutic exercise;Balance training;Neuromuscular re-education;Patient/family education;Dry needling;Orthotic Fit/Training;Manual techniques;Taping    Consulted and Agree with Plan of Care  Patient       Patient will benefit from skilled therapeutic intervention in order to improve the following deficits and impairments:  Abnormal gait, Decreased endurance, Hypomobility, Decreased activity tolerance, Decreased strength, Pain, Difficulty walking, Decreased mobility, Decreased range of motion, Improper body mechanics, Postural dysfunction, Impaired flexibility  Visit Diagnosis: Acute bilateral low back pain with left-sided sciatica     Problem List Patient Active Problem List   Diagnosis Date Noted  . Osteoarthritis of spine with radiculopathy, lumbar region 08/04/2017  . Routine general medical examination at a health care facility 09/23/2013  . ED (erectile dysfunction) 07/23/2011  . History of colonic polyps 12/25/2010  . HYPERTRIGLYCERIDEMIA 06/13/2009    Lyndee Hensen, PT, DPT 9:54 AM  12/23/17    [facility] [place], [place], [address] Phone: [phone]   Fax:  [phone]  Name: Marcus Reynolds MRN: [id number] Date of Birth: [birth year]

## 2017-12-25 ENCOUNTER — Ambulatory Visit (INDEPENDENT_AMBULATORY_CARE_PROVIDER_SITE_OTHER): Payer: Medicare HMO | Admitting: Physical Medicine and Rehabilitation

## 2017-12-25 ENCOUNTER — Ambulatory Visit (INDEPENDENT_AMBULATORY_CARE_PROVIDER_SITE_OTHER): Payer: Self-pay

## 2017-12-25 ENCOUNTER — Encounter: Payer: Self-pay | Admitting: Physical Therapy

## 2017-12-25 ENCOUNTER — Ambulatory Visit: Payer: Medicare HMO | Admitting: Physical Therapy

## 2017-12-25 VITALS — BP 140/87 | HR 65

## 2017-12-25 DIAGNOSIS — M5442 Lumbago with sciatica, left side: Secondary | ICD-10-CM | POA: Diagnosis not present

## 2017-12-25 DIAGNOSIS — M5416 Radiculopathy, lumbar region: Secondary | ICD-10-CM

## 2017-12-25 MED ORDER — BETAMETHASONE SOD PHOS & ACET 6 (3-3) MG/ML IJ SUSP
12.0000 mg | Freq: Once | INTRAMUSCULAR | Status: AC
  Administered 2017-12-25: 12 mg

## 2017-12-25 NOTE — Therapy (Signed)
[facility] [address] [place], [place], [address] Phone: [phone]   Fax:  [phone]  Physical Therapy Treatment  Patient Details  Name: Marcus Reynolds MRN: [id number] Date of Birth: 1946/03/25 Referring Provider: Teresa Coombs   Encounter Date: 12/25/2017  PT End of Session - 12/25/17 1139    Visit Number  7    Number of Visits  12    Date for PT Re-Evaluation  01/12/18    PT Start Time  1100    PT Stop Time  1140    PT Time Calculation (min)  40 min    Equipment Utilized During Treatment  Gait belt    Activity Tolerance  Patient tolerated treatment well;Patient limited by pain    Behavior During Therapy  [facility] for tasks assessed/performed       Past Medical History:  Diagnosis Date  . Personal history of colonic polyps 12/25/2010    Past Surgical History:  Procedure Laterality Date  . COLONOSCOPY  12/25/2010   internal hemorrhoids  . COLONOSCOPY W/ POLYPECTOMY  2007   [place], [place]     right side  . TONSILECTOMY, ADENOIDECTOMY, BILATERAL MYRINGOTOMY AND TUBES      There were no vitals filed for this visit.  Subjective Assessment - 12/25/17 1138    Subjective  Pt having injection today. States he was able to walk for 50 min yesterday, but had pain in PM when getting up and down when he was cleaning.     Pain Score  2     Pain Location  Back    Pain Orientation  Left;Right    Pain Descriptors / Indicators  Aching    Pain Onset  More than a month ago    Pain Frequency  Intermittent                       [facility] Adult PT Treatment/Exercise - 12/25/17 1105      Exercises   Exercises  Lumbar      Lumbar Exercises: Stretches   Active Hamstring Stretch  3 reps;30 seconds    Active Hamstring Stretch Limitations  seated    Single Knee to Chest Stretch  30 seconds;Right;Left;3 reps    Lower Trunk Rotation  --    Other Lumbar Stretch Exercise  Supine HSS with strap, and ankle  pump x20 bil;       Lumbar Exercises: Aerobic   Stationary Bike  L2 x 8 min;       Lumbar Exercises: Standing   Other Standing Lumbar Exercises  Mini squats x20;     Other Standing Lumbar Exercises  Hip ABD 2x10 bil;        Lumbar Exercises: Supine   Dead Bug  20 reps    Straight Leg Raise  20 reps    Straight Leg Raises Limitations  with TA    Other Supine Lumbar Exercises  modified crunch x20;       Lumbar Exercises: Sidelying   Hip Abduction  15 reps;Left;10 reps;Right      Manual Therapy   Manual Therapy  Passive ROM;Joint mobilization    Joint Mobilization  --    Passive ROM  --               PT Short Term Goals - 12/15/17 1657      PT SHORT TERM GOAL #1   Title  Pt to be independent with initial HEP, with focus  on lumbar flexion    Time  2    Period  Weeks    Status  Achieved        PT Long Term Goals - 12/02/17 1309      PT LONG TERM GOAL #1   Title  Pt to report decreased pain in low back and L LE, to 0-3/10 with standing activity, for at least 30 min    Time  6    Period  Weeks    Status  New    Target Date  01/13/18      PT LONG TERM GOAL #2   Title  Pt to report ability for ambulation up to 2 miles with pain no greater than 3/10    Time  6    Period  Weeks    Status  New    Target Date  01/13/18      PT LONG TERM GOAL #3   Title  Pt to be independent with long term HEP for mobility and strengthening.     Time  6    Period  Weeks    Status  New    Target Date  01/13/18      PT LONG TERM GOAL #4   Title  Pt to demo ability for improved lumbar Flexion ROM to have only minimal deficit, to improve ability for ADLs and IADLS.     Time  6    Period  Weeks    Status  New    Target Date  01/13/18            Plan - 12/25/17 1234    Clinical Impression Statement  Pt very challenged with hip and core strength progressions today, due to weakness. He is improving with overall strength, but will benefit from continued hip and core  strengthening. Plan to assess pain next week, after injection and will make d/c plan.     Rehab Potential  Good    PT Frequency  2x / week    PT Duration  6 weeks    PT Treatment/Interventions  Ultrasound;ADLs/Self Care Home Management;Cryotherapy;Electrical Stimulation;Iontophoresis 4mg /ml Dexamethasone;Moist Heat;Therapeutic activities;Functional mobility training;Stair training;Gait training;Therapeutic exercise;Balance training;Neuromuscular re-education;Patient/family education;Dry needling;Orthotic Fit/Training;Manual techniques;Taping    Consulted and Agree with Plan of Care  Patient       Patient will benefit from skilled therapeutic intervention in order to improve the following deficits and impairments:  Abnormal gait, Decreased endurance, Hypomobility, Decreased activity tolerance, Decreased strength, Pain, Difficulty walking, Decreased mobility, Decreased range of motion, Improper body mechanics, Postural dysfunction, Impaired flexibility  Visit Diagnosis: Acute bilateral low back pain with left-sided sciatica     Problem List Patient Active Problem List   Diagnosis Date Noted  . Osteoarthritis of spine with radiculopathy, lumbar region 08/04/2017  . Routine general medical examination at a health care facility 09/23/2013  . ED (erectile dysfunction) 07/23/2011  . History of colonic polyps 12/25/2010  . HYPERTRIGLYCERIDEMIA 06/13/2009    Lyndee Hensen, PT, DPT 12:36 PM  12/25/17    [facility] [place], [place], [address] Phone: [phone]   Fax:  [phone]  Name: Marcus Reynolds MRN: [id number] Date of Birth: Oct 15, 1945

## 2017-12-25 NOTE — Progress Notes (Signed)
 .  Numeric Pain Rating Scale and Functional Assessment Average Pain 5   In the last MONTH (on 0-10 scale) has pain interfered with the following?  1. General activity like being  able to carry out your everyday physical activities such as walking, climbing stairs, carrying groceries, or moving a chair?  Rating(3)   +Driver, -BT, -Dye Allergies.  

## 2017-12-25 NOTE — Patient Instructions (Signed)

## 2017-12-31 ENCOUNTER — Ambulatory Visit: Payer: Medicare HMO | Admitting: Physical Therapy

## 2017-12-31 ENCOUNTER — Encounter: Payer: Self-pay | Admitting: Physical Therapy

## 2017-12-31 DIAGNOSIS — M5442 Lumbago with sciatica, left side: Secondary | ICD-10-CM | POA: Diagnosis not present

## 2017-12-31 NOTE — Therapy (Signed)
[facility] [address] [place], [place], [address] Phone: [phone]   Fax:  [phone]  Physical Therapy Treatment  Patient Details  Name: Marcus Reynolds MRN: [id number] Date of Birth: Feb 03, 1946 Referring Provider: Teresa Coombs   Encounter Date: 12/31/2017  PT End of Session - 12/31/17 1407    Visit Number  8    Number of Visits  12    Date for PT Re-Evaluation  01/12/18    PT Start Time  8676    PT Stop Time  1430    PT Time Calculation (min)  45 min    Equipment Utilized During Treatment  --    Activity Tolerance  Patient tolerated treatment well;Patient limited by pain    Behavior During Therapy  [facility] for tasks assessed/performed       Past Medical History:  Diagnosis Date  . Personal history of colonic polyps 12/25/2010    Past Surgical History:  Procedure Laterality Date  . COLONOSCOPY  12/25/2010   internal hemorrhoids  . COLONOSCOPY W/ POLYPECTOMY  2007   [place], [place]     right side  . TONSILECTOMY, ADENOIDECTOMY, BILATERAL MYRINGOTOMY AND TUBES      There were no vitals filed for this visit.  Subjective Assessment - 12/31/17 1404    Subjective  Pt had injection last week. States minimal improvment felt since injection. He feels that he is improving overall, but is still getting radiating pain into Bil posterior thighs with increased standing and bending.     Currently in Pain?  Yes    Pain Score  4     Pain Location  Back    Pain Orientation  Left;Right    Pain Descriptors / Indicators  Aching    Pain Type  Chronic pain    Pain Onset  More than a month ago    Pain Frequency  Intermittent                       [facility] Adult PT Treatment/Exercise - 12/31/17 0001      Exercises   Exercises  Lumbar      Lumbar Exercises: Stretches   Active Hamstring Stretch  3 reps;30 seconds    Active Hamstring Stretch Limitations  seated    Single Knee to Chest Stretch  30  seconds;Right;Left;3 reps    Other Lumbar Stretch Exercise  --      Lumbar Exercises: Aerobic   Stationary Bike  L2 x 8 min;       Lumbar Exercises: Standing   Other Standing Lumbar Exercises  --    Other Standing Lumbar Exercises  Hip ABD 2x10 bil;        Lumbar Exercises: Supine   Clam  20 reps    Clam Limitations  with TA and GTB    Dead Bug  20 reps    Straight Leg Raise  20 reps    Straight Leg Raises Limitations  with TA    Other Supine Lumbar Exercises  modified crunch x20;       Lumbar Exercises: Sidelying   Hip Abduction  15 reps;Left;10 reps;Right      Lumbar Exercises: Prone   Other Prone Lumbar Exercises  Prone quad stretch 30 sec x3 bil; (for HEP)       Manual Therapy   Manual Therapy  Passive ROM;Joint mobilization             PT Education - 12/31/17  1406    Education Details  Final HEP issued. D/C plan discussed.     Person(s) Educated  Patient    Methods  Explanation;Demonstration;Handout    Comprehension  Verbalized understanding;Need further instruction       PT Short Term Goals - 12/15/17 1657      PT SHORT TERM GOAL #1   Title  Pt to be independent with initial HEP, with focus on lumbar flexion    Time  2    Period  Weeks    Status  Achieved        PT Long Term Goals - 12/02/17 1309      PT LONG TERM GOAL #1   Title  Pt to report decreased pain in low back and L LE, to 0-3/10 with standing activity, for at least 30 min    Time  6    Period  Weeks    Status  New    Target Date  01/13/18      PT LONG TERM GOAL #2   Title  Pt to report ability for ambulation up to 2 miles with pain no greater than 3/10    Time  6    Period  Weeks    Status  New    Target Date  01/13/18      PT LONG TERM GOAL #3   Title  Pt to be independent with long term HEP for mobility and strengthening.     Time  6    Period  Weeks    Status  New    Target Date  01/13/18      PT LONG TERM GOAL #4   Title  Pt to demo ability for improved lumbar Flexion  ROM to have only minimal deficit, to improve ability for ADLs and IADLS.     Time  6    Period  Weeks    Status  New    Target Date  01/13/18            Plan - 12/31/17 1506    Clinical Impression Statement  Final HEP given with handout. Reviewed in detail today, Pt requires cueing for mechanics, moslty with strengthening exercises added to HEP today. Discussed treatment plan and d/c plan today. Discussed possibility of pt going to another PT clinic to try lumbar traction for pain relief if he is able, will decide next visit. Pt to benefit from an additional visit for finalizing HEP . Pt continues to have radicular symptoms bilaterally, likely due to degree of stenosis.     Rehab Potential  Good    PT Frequency  2x / week    PT Duration  6 weeks    PT Treatment/Interventions  Ultrasound;ADLs/Self Care Home Management;Cryotherapy;Electrical Stimulation;Iontophoresis 4mg /ml Dexamethasone;Moist Heat;Therapeutic activities;Functional mobility training;Stair training;Gait training;Therapeutic exercise;Balance training;Neuromuscular re-education;Patient/family education;Dry needling;Orthotic Fit/Training;Manual techniques;Taping    Consulted and Agree with Plan of Care  Patient       Patient will benefit from skilled therapeutic intervention in order to improve the following deficits and impairments:  Abnormal gait, Decreased endurance, Hypomobility, Decreased activity tolerance, Decreased strength, Pain, Difficulty walking, Decreased mobility, Decreased range of motion, Improper body mechanics, Postural dysfunction, Impaired flexibility  Visit Diagnosis: Acute bilateral low back pain with left-sided sciatica     Problem List Patient Active Problem List   Diagnosis Date Noted  . Osteoarthritis of spine with radiculopathy, lumbar region 08/04/2017  . Routine general medical examination at a health care facility 09/23/2013  . ED (erectile dysfunction)  07/23/2011  . History of colonic  polyps 12/25/2010  . HYPERTRIGLYCERIDEMIA 06/13/2009    Lyndee Hensen, PT, DPT 3:08 PM  12/31/17     [facility] [place], [place], [address] Phone: [phone]   Fax:  [phone]  Name: Millan Legan MRN: [id number] Date of Birth: Dec 02, 1945

## 2018-01-05 ENCOUNTER — Encounter: Payer: Medicare HMO | Admitting: Physical Therapy

## 2018-01-06 ENCOUNTER — Encounter: Payer: Self-pay | Admitting: Physical Therapy

## 2018-01-06 ENCOUNTER — Ambulatory Visit: Payer: Medicare HMO | Attending: Sports Medicine | Admitting: Physical Therapy

## 2018-01-06 DIAGNOSIS — M79652 Pain in left thigh: Secondary | ICD-10-CM | POA: Insufficient documentation

## 2018-01-06 DIAGNOSIS — R262 Difficulty in walking, not elsewhere classified: Secondary | ICD-10-CM | POA: Diagnosis not present

## 2018-01-06 DIAGNOSIS — M6281 Muscle weakness (generalized): Secondary | ICD-10-CM

## 2018-01-06 DIAGNOSIS — M25652 Stiffness of left hip, not elsewhere classified: Secondary | ICD-10-CM

## 2018-01-06 DIAGNOSIS — M79651 Pain in right thigh: Secondary | ICD-10-CM | POA: Diagnosis not present

## 2018-01-06 NOTE — Therapy (Signed)
[facility] [address] [place], [place], [address] Phone: [phone]   Fax:  [phone]  Physical Therapy Treatment  Patient Details  Name: Marcus Reynolds MRN: [id number] Date of Birth: 1946-05-18 Referring Provider: Teresa Coombs   Encounter Date: 01/06/2018  PT End of Session - 01/06/18 1006    Visit Number  9    Number of Visits  12    Date for PT Re-Evaluation  01/12/18    PT Start Time  0940    PT Stop Time  1015 referred for trial to traction only    PT Time Calculation (min)  35 min    Activity Tolerance  Patient tolerated treatment well       Past Medical History:  Diagnosis Date  . Personal history of colonic polyps 12/25/2010    Past Surgical History:  Procedure Laterality Date  . COLONOSCOPY  12/25/2010   internal hemorrhoids  . COLONOSCOPY W/ POLYPECTOMY  2007   [place], [place]     right side  . TONSILECTOMY, ADENOIDECTOMY, BILATERAL MYRINGOTOMY AND TUBES      There were no vitals filed for this visit.  Subjective Assessment - 01/06/18 0942    Subjective  Have done injections and exercises.  Interested in trying traction.  I did a mile hike last night.  I was ready to sit down afterwards.  The injection 10-12 days ago.  That has helped a bit but I can overdo it.  I'm still doing my workout at [employer].  I'm still doing the exercises from PT.      How long can you walk comfortably?  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  Yes    Pain Score  4  upper HS    Pain Location  Back    Pain Type  Chronic pain                       [facility] Adult PT Treatment/Exercise - 01/06/18 0001      Self-Care   Other Self-Care Comments   review of HEP, what to do if soreness from traction;  benefits of aquatic ex for decompression       Traction   Type of Traction  Lumbar    Min (lbs)  30     Max (lbs)  65    Hold Time  45    Rest Time  15    Time  15 min                PT Short Term Goals - 12/15/17 1657      PT SHORT TERM GOAL #1   Title  Pt to be independent with initial HEP, with focus on lumbar flexion    Time  2    Period  Weeks    Status  Achieved        PT Long Term Goals - 01/06/18 2026      PT LONG TERM GOAL #1   Title  Pt to report decreased pain in low back and L LE, to 0-3/10 with standing activity, for at least 30 min    Time  6    Period  Weeks    Status  On-going      PT LONG TERM GOAL #2   Title  Pt to report ability  for ambulation up to 2 miles with pain no greater than 3/10    Time  6    Period  Weeks    Status  On-going      PT LONG TERM GOAL #3   Title  Pt to be independent with long term HEP for mobility and strengthening.     Status  Achieved      PT LONG TERM GOAL #4   Title  Pt to demo ability for improved lumbar Flexion ROM to have only minimal deficit, to improve ability for ADLs and IADLS.     Time  6    Period  Weeks    Status  On-going      PT LONG TERM GOAL #5   Title  FOTO functional outcome score improved from 52% limitation to 35% limitation indicating improved function with less pain    Time  8    Period  Weeks    Status  On-going            Plan - 01/06/18 1008    Clinical Impression Statement  The patient reports he is independent with a HEP from his course of PT from [facility].  He is referred to this facility for a trial of lumbar mechanical traction.  He reports a good initial response from traction at a low intensity.  Therapist closely monitoring symptoms throughout treatment session.      Rehab Potential  Good    PT Frequency  2x / week    PT Duration  6 weeks    PT Treatment/Interventions  Ultrasound;ADLs/Self Care Home Management;Cryotherapy;Electrical Stimulation;Iontophoresis 4mg /ml Dexamethasone;Moist Heat;Therapeutic activities;Functional mobility training;Stair  training;Gait training;Therapeutic exercise;Balance training;Neuromuscular re-education;Patient/family education;Dry needling;Orthotic Fit/Training;Manual techniques;Taping    PT Next Visit Plan  10th visit note;  assess response to lumbar traction;  increase pull as tolerated    Consulted and Agree with Plan of Care  Patient       Patient will benefit from skilled therapeutic intervention in order to improve the following deficits and impairments:  Abnormal gait, Decreased endurance, Hypomobility, Decreased activity tolerance, Decreased strength, Pain, Difficulty walking, Decreased mobility, Decreased range of motion, Improper body mechanics, Postural dysfunction, Impaired flexibility  Visit Diagnosis: Pain in left thigh  Pain in right thigh  Muscle weakness (generalized)  Difficulty in walking, not elsewhere classified  Stiffness of left hip, not elsewhere classified     Problem List Patient Active Problem List   Diagnosis Date Noted  . Osteoarthritis of spine with radiculopathy, lumbar region 08/04/2017  . Routine general medical examination at a health care facility 09/23/2013  . ED (erectile dysfunction) 07/23/2011  . History of colonic polyps 12/25/2010  . HYPERTRIGLYCERIDEMIA 06/13/2009   [name], PT 01/06/18 8:28 PM Phone: [phone] Fax: [phone]  [name] 01/06/2018, 8:28 PM  [facility] [address] [place], [place], [address] Phone: [phone]   Fax:  [phone]  Name: Marcus Reynolds MRN: [id number] Date of Birth: 26-Nov-1945

## 2018-01-07 ENCOUNTER — Encounter: Payer: Medicare HMO | Admitting: Physical Therapy

## 2018-01-07 NOTE — Procedures (Signed)
Lumbosacral Transforaminal Epidural Steroid Injection - Sub-Pedicular Approach with Fluoroscopic Guidance  Patient: Marcus Reynolds      Date of Birth: 04/07/1946 MRN: [id number] PCP: Dorena Cookey, MD      Visit Date: 12/25/2017   Universal Protocol:    Date/Time: 12/25/2017  Consent Given By: the patient  Position: PRONE  Additional Comments: Vital signs were monitored before and after the procedure. Patient was prepped and draped in the usual sterile fashion. The correct patient, procedure, and site was verified.   Injection Procedure Details:  Procedure Site One Meds Administered:  Meds ordered this encounter  Medications  . betamethasone acetate-betamethasone sodium phosphate (CELESTONE) injection 12 mg    Laterality: Bilateral  Location/Site:  L3-L4  Needle size: 22 G  Needle type: Spinal  Needle Placement: Transforaminal  Findings:    -Comments: Excellent flow of contrast along the nerve and into the epidural space.  Procedure Details: After squaring off the end-plates to get a true AP view, the C-arm was positioned so that an oblique view of the foramen as noted above was visualized. The target area is just inferior to the "nose of the scotty dog" or sub pedicular. The soft tissues overlying this structure were infiltrated with 2-3 ml. of 1% Lidocaine without Epinephrine.  The spinal needle was inserted toward the target using a "trajectory" view along the fluoroscope beam.  Under AP and lateral visualization, the needle was advanced so it did not puncture dura and was located close the 6 O'Clock position of the pedical in AP tracterory. Biplanar projections were used to confirm position. Aspiration was confirmed to be negative for CSF and/or blood. A 1-2 ml. volume of Isovue-250 was injected and flow of contrast was noted at each level. Radiographs were obtained for documentation purposes.   After attaining the desired flow of contrast documented above, a 0.5 to  1.0 ml test dose of 0.25% Marcaine was injected into each respective transforaminal space.  The patient was observed for 90 seconds post injection.  After no sensory deficits were reported, and normal lower extremity motor function was noted,   the above injectate was administered so that equal amounts of the injectate were placed at each foramen (level) into the transforaminal epidural space.   Additional Comments:  The patient tolerated the procedure well Dressing: Band-Aid    Post-procedure details: Patient was observed during the procedure. Post-procedure instructions were reviewed.  Patient left the clinic in stable condition.

## 2018-01-07 NOTE — Progress Notes (Signed)
Marcus Reynolds - 72 y.o. male MRN [id number]  Date of birth: [birth year]  Office Visit Note: Visit Date: 12/25/2017 PCP: Dorena Cookey, MD Referred by: Dorena Cookey, MD  Subjective: Chief Complaint  Patient presents with  . Lower Back - Pain  . Right Thigh - Pain  . Left Thigh - Pain   HPI: Marcus Reynolds is a 72 year old gentleman with severe lumbar stenosis multifactorial 3-4 with moderate stenosis at L2 2-3 and L4-5.  Prior interlaminar epidural steroid injection back in April gave him more than 80% relief for quite a while.  He has been going through physical therapy and symptoms have progressed and gotten worse again with these having bilateral low back pain right more than left.  This is referring into the posterior thighs bilaterally with neurogenic claudication.  We are going to try a bilateral L3 transforaminal injection at the level of greatest stenosis to see if we get more longer-term relief.  The patient unfortunately may need to seek spine surgery evaluation for decompression.   ROS Otherwise per HPI.  Assessment & Plan: Visit Diagnoses:  1. Lumbar radiculopathy     Plan: No additional findings.   Meds & Orders:  Meds ordered this encounter  Medications  . betamethasone acetate-betamethasone sodium phosphate (CELESTONE) injection 12 mg    Orders Placed This Encounter  Procedures  . XR C-ARM NO REPORT  . Epidural Steroid injection    Follow-up: Return if symptoms worsen or fail to improve.   Procedures: No procedures performed  Lumbosacral Transforaminal Epidural Steroid Injection - Sub-Pedicular Approach with Fluoroscopic Guidance  Patient: Marcus Reynolds      Date of Birth: 1945/11/02 MRN: [id number] PCP: Dorena Cookey, MD      Visit Date: 12/25/2017   Universal Protocol:    Date/Time: 12/25/2017  Consent Given By: the patient  Position: PRONE  Additional Comments: Vital signs were monitored before and after the procedure. Patient was prepped and draped  in the usual sterile fashion. The correct patient, procedure, and site was verified.   Injection Procedure Details:  Procedure Site One Meds Administered:  Meds ordered this encounter  Medications  . betamethasone acetate-betamethasone sodium phosphate (CELESTONE) injection 12 mg    Laterality: Bilateral  Location/Site:  L3-L4  Needle size: 22 G  Needle type: Spinal  Needle Placement: Transforaminal  Findings:    -Comments: Excellent flow of contrast along the nerve and into the epidural space.  Procedure Details: After squaring off the end-plates to get a true AP view, the C-arm was positioned so that an oblique view of the foramen as noted above was visualized. The target area is just inferior to the "nose of the scotty dog" or sub pedicular. The soft tissues overlying this structure were infiltrated with 2-3 ml. of 1% Lidocaine without Epinephrine.  The spinal needle was inserted toward the target using a "trajectory" view along the fluoroscope beam.  Under AP and lateral visualization, the needle was advanced so it did not puncture dura and was located close the 6 O'Clock position of the pedical in AP tracterory. Biplanar projections were used to confirm position. Aspiration was confirmed to be negative for CSF and/or blood. A 1-2 ml. volume of Isovue-250 was injected and flow of contrast was noted at each level. Radiographs were obtained for documentation purposes.   After attaining the desired flow of contrast documented above, a 0.5 to 1.0 ml test dose of 0.25% Marcaine was injected into each respective transforaminal space.  The patient was  observed for 90 seconds post injection.  After no sensory deficits were reported, and normal lower extremity motor function was noted,   the above injectate was administered so that equal amounts of the injectate were placed at each foramen (level) into the transforaminal epidural space.   Additional Comments:  The patient tolerated the  procedure well Dressing: Band-Aid    Post-procedure details: Patient was observed during the procedure. Post-procedure instructions were reviewed.  Patient left the clinic in stable condition.    Clinical History: MRI LUMBAR SPINE WITHOUT CONTRAST  TECHNIQUE: Multiplanar, multisequence MR imaging of the lumbar spine was performed. No intravenous contrast was administered.  COMPARISON:  Lumbar spine radiographs August 04, 2017  FINDINGS: SEGMENTATION: For the purposes of this report, the last well-formed intervertebral disc is reported as L5-S1. sacralized L5 vertebral body.  ALIGNMENT: Straightened lumbar lordosis. Grade 1 L3-4 anterolisthesis. No spondylolysis.  VERTEBRAE:Vertebral bodies are intact. Developmentally smaller L5-S1 disc. Diffuse disc desiccation. Multilevel mild chronic discogenic endplate changes. No abnormal or acute bone marrow signal.  CONUS MEDULLARIS AND CAUDA EQUINA: Conus medullaris terminates at L1-2 and demonstrates normal morphology and signal characteristics. Cauda equina is normal.  PARASPINAL AND OTHER SOFT TISSUES: Included prevertebral and paraspinal soft tissues are normal.  DISC LEVELS:  T12-L1, L1-2: Annular bulging. Mild facet arthropathy. No canal stenosis or neural foraminal narrowing.  L2-3: Small broad-based disc bulge. Moderate to severe facet arthropathy and ligamentum flavum redundancy. 10 mm LEFT facet synovial cyst within ligamentum flavum. Moderate canal stenosis, there LEFT lateral recess could affect the traversing LEFT L3 nerve. No neural foraminal narrowing.  L3-4: Anterolisthesis. Moderate broad-based disc bulge. Moderate to severe facet arthropathy with bilateral facet effusions. 5 mm LEFT facet synovial cyst, potentially rupture through the ligamentum flavum. Severe canal stenosis with narrowed lateral recesses, this could affect the traversing L4 nerve. Mild bilateral neural foraminal  narrowing.  L4-5: Moderate broad-based disc bulge, annular fissure. Moderate facet arthropathy and ligamentum flavum redundancy. Moderate canal stenosis. Moderate RIGHT, mild LEFT neural foraminal narrowing.  L5-S1: Moderate RIGHT central to subarticular disc protrusion. Moderate facet arthropathy. No canal stenosis. Mild bilateral neural foraminal narrowing.  IMPRESSION: 1. No fracture.  Grade 1 L3-4 anterolisthesis without spondylolysis. 2. 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 L3-4. Moderate canal stenosis L2-3 and L4-5. 3. Neural foraminal narrowing L3-4 through L5-S1: Moderate on the RIGHT at L4-5.   Electronically Signed   By: Elon Alas M.D.   On: 11/23/2017 01:54   LUMBAR SPINE - COMPLETE 4+ VIEW  COMPARISON: None.  FINDINGS: Minimal rightward curvature of the thoracolumbar spine. Vertebral body heights appear normal. Mild degenerative changes at L3-L4 and L4-L5 with moderate degenerative changes at L5-S1. Possible calcifications overlying the kidneys.  IMPRESSION: 1. No acute osseous abnormality. Mild to moderate degenerative changes most notable at L5-S1 2. Possible stones within the kidneys.   Electronically Signed By: Donavan Foil M.D. On: 08/04/2017 14:09   He reports that he has never smoked. He has never used smokeless tobacco. No results for input(s): HGBA1C, LABURIC in the last 8760 hours.  Objective:  VS:  HT:    WT:   BMI:     BP:140/87  HR:65bpm  TEMP: ( )  RESP:  Physical Exam  Ortho Exam Imaging: No results found.  Past Medical/Family/Surgical/Social History: Medications & Allergies reviewed per EMR, new medications updated. Patient Active Problem List   Diagnosis Date Noted  . Osteoarthritis of spine with radiculopathy, lumbar region 08/04/2017  . Routine general medical examination  at a health care facility 09/23/2013  . ED (erectile dysfunction) 07/23/2011  . History of colonic polyps  12/25/2010  . HYPERTRIGLYCERIDEMIA 06/13/2009   Past Medical History:  Diagnosis Date  . Personal history of colonic polyps 12/25/2010   Family History  Problem Relation Age of Onset  . Heart disease Mother   . Diabetes Mother   . Ulcerative colitis Mother   . Colon cancer Neg Hx   . Esophageal cancer Neg Hx   . Rectal cancer Neg Hx   . Stomach cancer Neg Hx    Past Surgical History:  Procedure Laterality Date  . COLONOSCOPY  12/25/2010   internal hemorrhoids  . COLONOSCOPY W/ POLYPECTOMY  2007   [place], [place]     right side  . TONSILECTOMY, ADENOIDECTOMY, BILATERAL MYRINGOTOMY AND TUBES     Social History   Occupational History  . Occupation: Retired  Tobacco Use  . Smoking status: Never Smoker  . Smokeless tobacco: Never Used  Substance and Sexual Activity  . Alcohol use: Yes    Alcohol/week: 0.0 oz    Comment: socially  . Drug use: No  . Sexual activity: Not on file

## 2018-01-09 ENCOUNTER — Encounter: Payer: Self-pay | Admitting: Physical Therapy

## 2018-01-09 ENCOUNTER — Ambulatory Visit: Payer: Medicare HMO | Attending: Sports Medicine | Admitting: Physical Therapy

## 2018-01-09 DIAGNOSIS — M6281 Muscle weakness (generalized): Secondary | ICD-10-CM | POA: Diagnosis not present

## 2018-01-09 DIAGNOSIS — M79652 Pain in left thigh: Secondary | ICD-10-CM | POA: Diagnosis not present

## 2018-01-09 DIAGNOSIS — R262 Difficulty in walking, not elsewhere classified: Secondary | ICD-10-CM | POA: Diagnosis not present

## 2018-01-09 DIAGNOSIS — M79651 Pain in right thigh: Secondary | ICD-10-CM | POA: Diagnosis not present

## 2018-01-09 DIAGNOSIS — M25652 Stiffness of left hip, not elsewhere classified: Secondary | ICD-10-CM | POA: Diagnosis not present

## 2018-01-09 NOTE — Therapy (Signed)
[facility] [address], [place], [place], [address] Phone: [phone]   Fax:  [phone]  Physical Therapy Treatment  Patient Details  Name: Marcus Reynolds MRN: [id number] Date of Birth: 02-Mar-1946 Referring Provider: Teresa Coombs  Progress Note Reporting Period 6/24/19to 01/09/18  See note below for Objective Data and Assessment of Progress/Goals.      Encounter Date: 01/09/2018  PT End of Session - 01/09/18 1041    Visit Number  10    Number of Visits  12    Date for PT Re-Evaluation  01/12/18    PT Start Time  7782    PT Stop Time  1100    PT Time Calculation (min)  45 min    Activity Tolerance  Patient tolerated treatment well       Past Medical History:  Diagnosis Date  . Personal history of colonic polyps 12/25/2010    Past Surgical History:  Procedure Laterality Date  . COLONOSCOPY  12/25/2010   internal hemorrhoids  . COLONOSCOPY W/ POLYPECTOMY  2007   [place], [place]     right side  . TONSILECTOMY, ADENOIDECTOMY, BILATERAL MYRINGOTOMY AND TUBES      There were no vitals filed for this visit.  Subjective Assessment - 01/09/18 1032    Subjective  Tuesday and Wednesday were good but yesterday my back was sore.  On Wednesday I walked an hour.  I'm not sure if it was from the walking that made me sore or the traction.  My piriformis is sore.      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  Yes    Pain Score  3     Pain Location  Hip    Pain Orientation  Left;Right    Pain Type  Chronic pain    Pain Frequency  Intermittent    Aggravating Factors   weeding, mornings         [facility] PT Assessment - 01/09/18 0001      AROM   Overall AROM Comments  Lumbar: Flexion: moderate/significant limitation;  Extension: moderate limitation; SB: mild/moderate limitation;  Hips: mild limitations bilaterally, Extension: mild to  moderate limitation bilaterally;       Strength   Overall Strength Comments  Hips: 4/5;  Core: 4/5;                    [facility] Adult PT Treatment/Exercise - 01/09/18 0001      Therapeutic Activites    Therapeutic Activities  ADL's    ADL's  discussed body mechanics with weeding      Lumbar Exercises: Supine   Other Supine Lumbar Exercises  piriformis stretch per HEP      Lumbar Exercises: Quadruped   [place]  -- per HEP    Other Quadruped Lumbar Exercises  childs pose with and without bias    Other Quadruped Lumbar Exercises  discussed piriformis ex      Traction   Type of Traction  Lumbar    Min (lbs)  30    Max (lbs)  65    Hold Time  45    Rest Time  15    Time  15 min              PT Education - 01/09/18 1040    Education Details   Access Code: [email or web address]  childs pose with  and without bias and modifications, cat/cow, supine piriformis stretch    Person(s) Educated  Patient    Methods  Demonstration;Explanation;Handout    Comprehension  Returned demonstration;Verbalized understanding       PT Short Term Goals - 12/15/17 1657      PT SHORT TERM GOAL #1   Title  Pt to be independent with initial HEP, with focus on lumbar flexion    Time  2    Period  Weeks    Status  Achieved        PT Long Term Goals - 01/06/18 2026      PT LONG TERM GOAL #1   Title  Pt to report decreased pain in low back and L LE, to 0-3/10 with standing activity, for at least 30 min    Time  6    Period  Weeks    Status  On-going      PT LONG TERM GOAL #2   Title  Pt to report ability for ambulation up to 2 miles with pain no greater than 3/10    Time  6    Period  Weeks    Status  On-going      PT LONG TERM GOAL #3   Title  Pt to be independent with long term HEP for mobility and strengthening.     Status  Achieved      PT LONG TERM GOAL #4   Title  Pt to demo ability for improved lumbar Flexion ROM to have only minimal deficit, to improve ability for  ADLs and IADLS.     Time  6    Period  Weeks    Status  On-going      PT LONG TERM GOAL #5   Title  FOTO functional outcome score improved from 52% limitation to 35% limitation indicating improved function with less pain    Time  8    Period  Weeks    Status  On-going            Plan - 01/09/18 1105    Clinical Impression Statement  The patient is unsure of the benefit of mechanical traction initial trial.  He reports soreness in lumbosacral and piriformis regions.  Added to his comprensive HEP ex's to specifically address mobility in this area.  Will follow up in 1 week to assess response to 2nd lumbar traction.  Will reassess progress toward goals to determine if further treatment is needed vs. discharge.  Improving lumbar and hip ROM as well as core strength from initial assessment.  Therapist closely monitoring response with all interventions.      Rehab Potential  Good    PT Frequency  2x / week    PT Duration  6 weeks    PT Treatment/Interventions  Ultrasound;ADLs/Self Care Home Management;Cryotherapy;Electrical Stimulation;Iontophoresis 4mg /ml Dexamethasone;Moist Heat;Therapeutic activities;Functional mobility training;Stair training;Gait training;Therapeutic exercise;Balance training;Neuromuscular re-education;Patient/family education;Dry needling;Orthotic Fit/Training;Manual techniques;Taping    PT Next Visit Plan  ERO next visit;  assess response to lumbar traction #2;  review HEP as needed;  do FOTO       Patient will benefit from skilled therapeutic intervention in order to improve the following deficits and impairments:  Abnormal gait, Decreased endurance, Hypomobility, Decreased activity tolerance, Decreased strength, Pain, Difficulty walking, Decreased mobility, Decreased range of motion, Improper body mechanics, Postural dysfunction, Impaired flexibility  Visit Diagnosis: Pain in left thigh  Pain in right thigh  Muscle weakness (generalized)  Difficulty in  walking, not elsewhere classified  Stiffness  of left hip, not elsewhere classified     Problem List Patient Active Problem List   Diagnosis Date Noted  . Osteoarthritis of spine with radiculopathy, lumbar region 08/04/2017  . Routine general medical examination at a health care facility 09/23/2013  . ED (erectile dysfunction) 07/23/2011  . History of colonic polyps 12/25/2010  . HYPERTRIGLYCERIDEMIA 06/13/2009   [name], PT 01/09/18 11:15 AM Phone: [phone] Fax: [phone]  [name] 01/09/2018, 11:13 AM  [facility] [address] [place], [place], [address] Phone: [phone]   Fax:  [phone]  Name: Dhilan Brauer MRN: [id number] Date of Birth: 1946/04/04

## 2018-01-09 NOTE — Patient Instructions (Signed)
    Access Code: [email or web address]  URL: [email or web address]  Date: 01/09/2018  Prepared by: Bishop - 5 reps - 1 sets - 20 hold - 1x daily - 7x weekly  Child's Pose Stretch - 3 reps - 1 sets - 20 hold - 1x daily - 7x weekly  Supine Piriformis Stretch with Leg Straight - 5 reps - 1 sets - 20 hold - 1x daily - 7x weekly  Cat Cow - 10 reps - 1 sets - 1x daily - 7x weekly     [facility] [address], [place], [place] [address] Phone # [phone] Fax [phone]

## 2018-01-15 ENCOUNTER — Encounter: Payer: Self-pay | Admitting: Physical Therapy

## 2018-01-15 ENCOUNTER — Ambulatory Visit: Payer: Medicare HMO | Admitting: Physical Therapy

## 2018-01-15 DIAGNOSIS — M25652 Stiffness of left hip, not elsewhere classified: Secondary | ICD-10-CM

## 2018-01-15 DIAGNOSIS — M79651 Pain in right thigh: Secondary | ICD-10-CM | POA: Diagnosis not present

## 2018-01-15 DIAGNOSIS — M6281 Muscle weakness (generalized): Secondary | ICD-10-CM

## 2018-01-15 DIAGNOSIS — R262 Difficulty in walking, not elsewhere classified: Secondary | ICD-10-CM

## 2018-01-15 DIAGNOSIS — M79652 Pain in left thigh: Secondary | ICD-10-CM

## 2018-01-15 NOTE — Therapy (Signed)
[facility] [address] [place], [place], [address] Phone: [phone]   Fax:  [phone]  Physical Therapy Treatment  Patient Details  Name: Marcus Reynolds MRN: [id number] Date of Birth: 1946/01/10 Referring Provider: Teresa Coombs   Encounter Date: 01/15/2018  PT End of Session - 01/15/18 1419    Visit Number  11    Date for PT Re-Evaluation  02/26/18    PT Start Time  1400    PT Stop Time  1440    PT Time Calculation (min)  40 min    Activity Tolerance  Patient tolerated treatment well       Past Medical History:  Diagnosis Date  . Personal history of colonic polyps 12/25/2010    Past Surgical History:  Procedure Laterality Date  . COLONOSCOPY  12/25/2010   internal hemorrhoids  . COLONOSCOPY W/ POLYPECTOMY  2007   [place], [place]     right side  . TONSILECTOMY, ADENOIDECTOMY, BILATERAL MYRINGOTOMY AND TUBES      There were no vitals filed for this visit.  Subjective Assessment - 01/15/18 1358    Subjective  I think things are better.  Sore in my piriformis when I do childs pose.  Other piriformis stretches are OK.  I'm not 100%  but this past weekend I walked and I felt pretty good.  Sore the next day after traction.  I would like to try another 1-2 x of traction.  Walked 50 minutes this morning and felt pretty good.  2 1/2 miles.      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  Yes    Pain Score  2     Pain Location  Back    Pain Orientation  Lower    Pain Type  Chronic pain         [facility] PT Assessment - 01/15/18 0001      Observation/Other Assessments   Modified Oswertry  24% moderate limitation       AROM   Overall AROM Comments  Lumbar: Flexion: moderate/significant limitation;  Extension: moderate limitation; SB: mild/moderate limitation;  Hips: mild limitations bilaterally, Extension: mild to moderate limitation  bilaterally;                    [facility] Adult PT Treatment/Exercise - 01/15/18 0001      Self-Care   Self-Care  Other Self-Care Comments    Other Self-Care Comments   review of HEP    discussion of home lumbar traction options     Traction   Type of Traction  Lumbar    Min (lbs)  30    Max (lbs)  65    Hold Time  15   shorter hold time than previous   Rest Time  15    Time  15 min                PT Short Term Goals - 12/15/17 1657      PT SHORT TERM GOAL #1   Title  Pt to be independent with initial HEP, with focus on lumbar flexion    Time  2    Period  Weeks    Status  Achieved        PT Long Term Goals - 01/15/18 1411      PT LONG TERM GOAL #1   Title  Pt to  report decreased pain in low back and L LE, to 0-3/10 with standing activity, for at least 30 min    Time  6    Period  Weeks    Status  On-going      PT LONG TERM GOAL #2   Title  Pt to report ability for ambulation up to 3 miles with pain no greater than 3/10    Status  Revised      PT LONG TERM GOAL #3   Title  Pt to be independent with long term HEP for mobility and strengthening.     Status  Achieved      PT LONG TERM GOAL #4   Title  Pt to demo ability for improved lumbar Flexion ROM to have only minimal deficit, to improve ability for ADLs and IADLS.     Time  6    Period  Weeks    Status  On-going      PT LONG TERM GOAL #5   Title  Modified Oswestry Score improved from    self perceived limitation to     limitation    Time  6    Period  Weeks    Status  New            Plan - 01/15/18 1715    Clinical Impression Statement  The patient reports an improvement in back and LE with the addition of mechanical lumbar traction to his comprehensive HEP for stretching and strengthening.  He is able to walk 2 miles with low intensity pain level but his personal goal is to return to 3 miles as he was able to do prior to his injury.  He is limited in his standing tolerance to < 30  min.  His Modified Oswestry score is at a moderate level of self perceived limitation.  Recommend 1-2 more visits for continued traction trial and will continue to discuss possible home options if he continues to have relief.      Clinical Presentation due to:  <    Rehab Potential  Good    PT Frequency  2x / week    PT Duration  6 weeks    PT Treatment/Interventions  Ultrasound;ADLs/Self Care Home Management;Cryotherapy;Electrical Stimulation;Iontophoresis 4mg /ml Dexamethasone;Moist Heat;Therapeutic activities;Functional mobility training;Stair training;Gait training;Therapeutic exercise;Balance training;Neuromuscular re-education;Patient/family education;Dry needling;Orthotic Fit/Training;Manual techniques;Taping    PT Next Visit Plan  1-2 more times of lumbar traction;  continue discussion of home options       Patient will benefit from skilled therapeutic intervention in order to improve the following deficits and impairments:  Abnormal gait, Decreased endurance, Hypomobility, Decreased activity tolerance, Decreased strength, Pain, Difficulty walking, Decreased mobility, Decreased range of motion, Improper body mechanics, Postural dysfunction, Impaired flexibility  Visit Diagnosis: Pain in left thigh - Plan: PT plan of care cert/re-cert  Pain in right thigh - Plan: PT plan of care cert/re-cert  Muscle weakness (generalized) - Plan: PT plan of care cert/re-cert  Difficulty in walking, not elsewhere classified - Plan: PT plan of care cert/re-cert  Stiffness of left hip, not elsewhere classified - Plan: PT plan of care cert/re-cert     Problem List Patient Active Problem List   Diagnosis Date Noted  . Osteoarthritis of spine with radiculopathy, lumbar region 08/04/2017  . Routine general medical examination at a health care facility 09/23/2013  . ED (erectile dysfunction) 07/23/2011  . History of colonic polyps 12/25/2010  . HYPERTRIGLYCERIDEMIA 06/13/2009   [name],  PT 01/15/18 7:41 PM Phone: [phone] Fax:  [phone]  [name] 01/15/2018, 7:41 PM  [facility] [address], [place], [place], [address] Phone: [phone]   Fax:  [phone]  Name: Marcus Reynolds MRN: [id number] Date of Birth: 08/05/1945

## 2018-01-23 DIAGNOSIS — D1801 Hemangioma of skin and subcutaneous tissue: Secondary | ICD-10-CM | POA: Diagnosis not present

## 2018-01-23 DIAGNOSIS — L570 Actinic keratosis: Secondary | ICD-10-CM | POA: Diagnosis not present

## 2018-01-23 DIAGNOSIS — Z85828 Personal history of other malignant neoplasm of skin: Secondary | ICD-10-CM | POA: Diagnosis not present

## 2018-01-23 DIAGNOSIS — C44619 Basal cell carcinoma of skin of left upper limb, including shoulder: Secondary | ICD-10-CM | POA: Diagnosis not present

## 2018-01-23 DIAGNOSIS — D225 Melanocytic nevi of trunk: Secondary | ICD-10-CM | POA: Diagnosis not present

## 2018-01-23 DIAGNOSIS — L820 Inflamed seborrheic keratosis: Secondary | ICD-10-CM | POA: Diagnosis not present

## 2018-01-23 DIAGNOSIS — L111 Transient acantholytic dermatosis [Grover]: Secondary | ICD-10-CM | POA: Diagnosis not present

## 2018-01-23 DIAGNOSIS — L814 Other melanin hyperpigmentation: Secondary | ICD-10-CM | POA: Diagnosis not present

## 2018-01-25 ENCOUNTER — Encounter: Payer: Self-pay | Admitting: Sports Medicine

## 2018-01-27 ENCOUNTER — Ambulatory Visit: Payer: Medicare HMO | Admitting: Physical Therapy

## 2018-01-27 ENCOUNTER — Encounter: Payer: Self-pay | Admitting: Physical Therapy

## 2018-01-27 DIAGNOSIS — R262 Difficulty in walking, not elsewhere classified: Secondary | ICD-10-CM | POA: Diagnosis not present

## 2018-01-27 DIAGNOSIS — M79651 Pain in right thigh: Secondary | ICD-10-CM | POA: Diagnosis not present

## 2018-01-27 DIAGNOSIS — M25652 Stiffness of left hip, not elsewhere classified: Secondary | ICD-10-CM

## 2018-01-27 DIAGNOSIS — M6281 Muscle weakness (generalized): Secondary | ICD-10-CM | POA: Diagnosis not present

## 2018-01-27 DIAGNOSIS — M79652 Pain in left thigh: Secondary | ICD-10-CM | POA: Diagnosis not present

## 2018-01-27 NOTE — Therapy (Addendum)
[facility] [address], [place], [place], [address] Phone: [phone]   Fax:  [phone]  Physical Therapy Treatment/Discharge Summary   Patient Details  Name: Marcus Reynolds MRN: [id number] Date of Birth: 27-Jan-1946 Referring Provider: Teresa Coombs   Encounter Date: 01/27/2018  PT End of Session - 01/27/18 1546    Visit Number  12    Date for PT Re-Evaluation  02/26/18    PT Start Time  1534    PT Stop Time  1610   trial of traction only;  doing ex's at home   PT Time Calculation (min)  36 min    Activity Tolerance  Patient tolerated treatment well       Past Medical History:  Diagnosis Date  . Personal history of colonic polyps 12/25/2010    Past Surgical History:  Procedure Laterality Date  . COLONOSCOPY  12/25/2010   internal hemorrhoids  . COLONOSCOPY W/ POLYPECTOMY  2007   [place], [place]     right side  . TONSILECTOMY, ADENOIDECTOMY, BILATERAL MYRINGOTOMY AND TUBES      There were no vitals filed for this visit.  Subjective Assessment - 01/27/18 1533    Subjective  I'm doing pretty good.  I take an Advil in the morning and that gets me through the day.    6, 650 steps today.  I did 2 1/2 miles to 3 miles for close to 1 hour.   My buddy is going to bring me a lumbar decompression brace that has an air pump to try.  No soreness after last traction.      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     Other Self-Care Comments   review of HEP ;  Discussion of home traction options     Traction   Type of Traction  Lumbar     Min (lbs)  30    Max (lbs)  65    Hold Time  15   shorter hold time than previous   Rest Time  15    Time  15 min                PT Short Term Goals - 12/15/17 1657      PT SHORT TERM GOAL #1   Title  Pt to be independent with initial HEP, with focus on lumbar flexion    Time  2    Period  Weeks    Status  Achieved        PT Long Term Goals - 01/27/18 1537      PT LONG TERM GOAL #2   Title  Pt to report ability for ambulation up to 3 miles with pain no greater than 3/10      PT LONG TERM GOAL #3   Title  Pt to be independent with long term HEP for mobility and strengthening.  Status  Achieved      PT LONG TERM GOAL #4   Title  Pt to demo ability for improved lumbar Flexion ROM to have only minimal deficit, to improve ability for ADLs and IADLS.     Time  6    Period  Weeks    Status  On-going      PT LONG TERM GOAL #5   Title  Modified Oswestry Score improved from mod   self perceived limitation to   min   limitation    Time  6    Period  Weeks    Status  On-going            Plan - 01/27/18 1547    Clinical Impression Statement  The patient reports an improvement with mechanical traction and has progressed with his walking tolerance to almost 3 miles.  He is working on finding a home decompression brace.  He should meet remaining goals by next visit.  Therapist closely monitoring response with throughout treatment session.  Reports good compliance with HEP.      Rehab Potential  Good    PT Frequency  2x / week    PT Duration  6 weeks    PT Treatment/Interventions  Ultrasound;ADLs/Self Care Home Management;Cryotherapy;Electrical Stimulation;Iontophoresis 94m/ml Dexamethasone;Moist Heat;Therapeutic activities;Functional mobility training;Stair training;Gait training;Therapeutic exercise;Balance training;Neuromuscular re-education;Patient/family education;Dry needling;Orthotic Fit/Training;Manual techniques;Taping    PT Next Visit Plan  1 more times of  lumbar traction;  continue discussion of home options;  probable discharge next visit       Patient will benefit from skilled therapeutic intervention in order to improve the following deficits and impairments:  Abnormal gait, Decreased endurance, Hypomobility, Decreased activity tolerance, Decreased strength, Pain, Difficulty walking, Decreased mobility, Decreased range of motion, Improper body mechanics, Postural dysfunction, Impaired flexibility  Visit Diagnosis: Pain in left thigh  Pain in right thigh  Muscle weakness (generalized)  Difficulty in walking, not elsewhere classified  Stiffness of left hip, not elsewhere classified    PHYSICAL THERAPY DISCHARGE SUMMARY  Visits from Start of Care: 12  Current functional level related to goals / functional outcomes: The patient completed PT for core strengthening in addition to trial of lumbar mechanical traction.  He has met the majority of rehab goals and patient called to cancel his last scheduled PT appt stating he was walking 3 miles with minimal to no pain.   He tried his friends decompression back brace but felt this was too bulky and decided he didn't really need one at this time.   He has met the majority of rehab goals.  Will discharge from PT at this time.     Remaining deficits: See clinical impressions above   Education / Equipment: Comprehensive HEP  Plan: Patient agrees to discharge.  Patient goals were met. Patient is being discharged due to being pleased with the current functional level.  ?????          Problem List Patient Active Problem List   Diagnosis Date Noted  . Osteoarthritis of spine with radiculopathy, lumbar region 08/04/2017  . Routine general medical examination at a health care facility 09/23/2013  . ED (erectile dysfunction) 07/23/2011  . History of colonic polyps 12/25/2010  . HYPERTRIGLYCERIDEMIA 06/13/2009   [facility] Im PT 01/27/18 4:00 PM Phone: [phone]:  [phone] SAlvera Singh8/20/2019, 4:00 PM  [facility] [address] [place] [address]: [phone]  Fax:  [phone] Name: Marcus  Reynolds MRN: [id number] Date of Birth: December 03, 1945

## 2018-02-06 ENCOUNTER — Ambulatory Visit: Payer: Medicare HMO | Admitting: Physical Therapy

## 2018-03-13 ENCOUNTER — Ambulatory Visit (INDEPENDENT_AMBULATORY_CARE_PROVIDER_SITE_OTHER): Payer: Medicare HMO

## 2018-03-13 DIAGNOSIS — Z23 Encounter for immunization: Secondary | ICD-10-CM | POA: Diagnosis not present

## 2018-03-13 NOTE — Progress Notes (Signed)
Per orders of Dr.Todd, injection of Fluzone given by Franco Collet. Patient tolerated injection well.

## 2018-05-04 DIAGNOSIS — H25813 Combined forms of age-related cataract, bilateral: Secondary | ICD-10-CM | POA: Diagnosis not present

## 2018-05-04 DIAGNOSIS — H35371 Puckering of macula, right eye: Secondary | ICD-10-CM | POA: Diagnosis not present

## 2018-05-13 DIAGNOSIS — H40013 Open angle with borderline findings, low risk, bilateral: Secondary | ICD-10-CM | POA: Diagnosis not present

## 2018-05-13 DIAGNOSIS — H3122 Choroidal dystrophy (central areolar) (generalized) (peripapillary): Secondary | ICD-10-CM | POA: Diagnosis not present

## 2018-05-13 DIAGNOSIS — H35371 Puckering of macula, right eye: Secondary | ICD-10-CM | POA: Diagnosis not present

## 2018-05-13 DIAGNOSIS — H33191 Other retinoschisis and retinal cysts, right eye: Secondary | ICD-10-CM | POA: Diagnosis not present

## 2018-05-13 DIAGNOSIS — H2513 Age-related nuclear cataract, bilateral: Secondary | ICD-10-CM | POA: Diagnosis not present

## 2018-05-13 DIAGNOSIS — H4423 Degenerative myopia, bilateral: Secondary | ICD-10-CM | POA: Diagnosis not present

## 2018-09-03 ENCOUNTER — Ambulatory Visit (INDEPENDENT_AMBULATORY_CARE_PROVIDER_SITE_OTHER): Payer: Medicare HMO | Admitting: Adult Health

## 2018-09-03 ENCOUNTER — Other Ambulatory Visit: Payer: Self-pay

## 2018-09-03 ENCOUNTER — Encounter: Payer: Self-pay | Admitting: Adult Health

## 2018-09-03 VITALS — BP 130/70 | HR 68 | Temp 98.0°F | Wt 188.0 lb

## 2018-09-03 DIAGNOSIS — E781 Pure hyperglyceridemia: Secondary | ICD-10-CM

## 2018-09-03 DIAGNOSIS — Z7689 Persons encountering health services in other specified circumstances: Secondary | ICD-10-CM | POA: Diagnosis not present

## 2018-09-03 DIAGNOSIS — N529 Male erectile dysfunction, unspecified: Secondary | ICD-10-CM | POA: Diagnosis not present

## 2018-09-03 NOTE — Progress Notes (Signed)
Patient presents to clinic today to establish care. He is a pleasant 73 year old male who  has a past medical history of Personal history of colonic polyps (12/25/2010).  He is a former patient of Dr. Sherren Mocha who last had a CPE in 10/2017   Acute Concerns: Establish Care  Chronic Issues: Hyperlipidemia -takes Lopid and daily baby aspirin Lab Results  Component Value Date   CHOL 152 11/06/2017   HDL 40.90 11/06/2017   LDLCALC 80 11/06/2017   TRIG 153.0 (H) 11/06/2017   CHOLHDL 4 11/06/2017   ED - takes Viagra as needed  Health Maintenance: Dental --Routine Care Vision -- Routine Care - Dr. Bing Plume  Immunizations -- UTD Colonoscopy -- UTD Diet: Eats a heart healthy diet  Exercise: Walks three miles per day  Past Medical History:  Diagnosis Date  . Personal history of colonic polyps 12/25/2010    Past Surgical History:  Procedure Laterality Date  . COLONOSCOPY  12/25/2010   internal hemorrhoids  . COLONOSCOPY W/ POLYPECTOMY  2007   [place], [place]     right side  . TONSILECTOMY, ADENOIDECTOMY, BILATERAL MYRINGOTOMY AND TUBES      Current Outpatient Medications on File Prior to Visit  Medication Sig Dispense Refill  . aspirin 81 MG tablet Take 81 mg by mouth daily.      Marland Kitchen gemfibrozil (LOPID) 600 MG tablet Take 1 tablet (600 mg total) by mouth 2 (two) times daily before a meal. 200 tablet 3  . Multiple Vitamin (MULTIVITAMIN) tablet Take 1 tablet by mouth daily.      . sildenafil (REVATIO) 20 MG tablet Take 1 to 2 tabs 2 - 3 hours before sex 30 tablet 11   No current facility-administered medications on file prior to visit.     No Known Allergies  Family History  Problem Relation Age of Onset  . Heart disease Mother   . Diabetes Mother   . Ulcerative colitis Mother   . Colon cancer Neg Hx   . Esophageal cancer Neg Hx   . Rectal cancer Neg Hx   . Stomach cancer Neg Hx     Social History   Socioeconomic History  . Marital status:  Married    Spouse name: Not on file  . Number of children: 2  . Years of education: Not on file  . Highest education level: Not on file  Occupational History  . Occupation: Retired  Scientific laboratory technician  . Financial resource strain: Not on file  . Food insecurity:    Worry: Not on file    Inability: Not on file  . Transportation needs:    Medical: Not on file    Non-medical: Not on file  Tobacco Use  . Smoking status: Never Smoker  . Smokeless tobacco: Never Used  Substance and Sexual Activity  . Alcohol use: Yes    Alcohol/week: 0.0 standard drinks    Comment: socially  . Drug use: No  . Sexual activity: Not on file  Lifestyle  . Physical activity:    Days per week: Not on file    Minutes per session: Not on file  . Stress: Not on file  Relationships  . Social connections:    Talks on phone: Not on file    Gets together: Not on file    Attends religious service: Not on file    Active member of club or organization: Not on file    Attends meetings of clubs or organizations:  Not on file    Relationship status: Not on file  . Intimate partner violence:    Fear of current or ex partner: Not on file    Emotionally abused: Not on file    Physically abused: Not on file    Forced sexual activity: Not on file  Other Topics Concern  . Not on file  Social History Narrative  . Not on file    Review of Systems  Constitutional: Negative.   HENT: Negative.   Eyes: Negative.   Respiratory: Negative.   Cardiovascular: Negative.   Gastrointestinal: Negative.   Genitourinary: Negative.   Skin: Negative.   Neurological: Negative.   Psychiatric/Behavioral: Negative.   All other systems reviewed and are negative.     BP 130/70 (BP Location: Left Arm, Patient Position: Sitting, Cuff Size: Normal)   Pulse 68   Temp 98 F (36.7 C) (Oral)   Wt 188 lb (85.3 kg)   SpO2 98%   BMI 24.80 kg/m   Physical Exam Vitals signs and nursing note reviewed.  Constitutional:       Appearance: Normal appearance.  Cardiovascular:     Rate and Rhythm: Normal rate and regular rhythm.     Pulses: Normal pulses.     Heart sounds: Normal heart sounds.  Pulmonary:     Effort: Pulmonary effort is normal.     Breath sounds: Normal breath sounds.  Skin:    General: Skin is warm and dry.     Capillary Refill: Capillary refill takes less than 2 seconds.  Neurological:     General: No focal deficit present.     Mental Status: He is alert and oriented to person, place, and time.  Psychiatric:        Mood and Affect: Mood normal.        Behavior: Behavior normal.        Thought Content: Thought content normal.        Judgment: Judgment normal.     Assessment/Plan: 1. Encounter to establish care - Healthy 73 year old male - follow up in June for CPE or sooner if needed  2. HYPERTRIGLYCERIDEMIA - Continue with Lopid and ASA - Continue to walk and eat healthy   3. Erectile dysfunction, unspecified erectile dysfunction type - Viagra as needed   Dorothyann Peng, NP

## 2018-09-16 ENCOUNTER — Encounter: Payer: Medicare HMO | Admitting: Adult Health

## 2018-11-11 ENCOUNTER — Encounter: Payer: Medicare HMO | Admitting: Adult Health

## 2018-11-30 ENCOUNTER — Encounter: Payer: Self-pay | Admitting: Adult Health

## 2018-11-30 DIAGNOSIS — E781 Pure hyperglyceridemia: Secondary | ICD-10-CM

## 2018-12-01 MED ORDER — GEMFIBROZIL 600 MG PO TABS: 600.0000 mg | ORAL_TABLET | Freq: Two times a day (BID) | ORAL | 0 refills | Status: DC

## 2018-12-01 NOTE — Telephone Encounter (Signed)
Sent to the pharmacy by e-scribe for 90 days.  Nothing further needed.

## 2019-01-22 DIAGNOSIS — D1801 Hemangioma of skin and subcutaneous tissue: Secondary | ICD-10-CM | POA: Diagnosis not present

## 2019-01-22 DIAGNOSIS — D2271 Melanocytic nevi of right lower limb, including hip: Secondary | ICD-10-CM | POA: Diagnosis not present

## 2019-01-22 DIAGNOSIS — D225 Melanocytic nevi of trunk: Secondary | ICD-10-CM | POA: Diagnosis not present

## 2019-01-22 DIAGNOSIS — L570 Actinic keratosis: Secondary | ICD-10-CM | POA: Diagnosis not present

## 2019-01-22 DIAGNOSIS — L821 Other seborrheic keratosis: Secondary | ICD-10-CM | POA: Diagnosis not present

## 2019-01-22 DIAGNOSIS — Z85828 Personal history of other malignant neoplasm of skin: Secondary | ICD-10-CM | POA: Diagnosis not present

## 2019-01-22 DIAGNOSIS — C44612 Basal cell carcinoma of skin of right upper limb, including shoulder: Secondary | ICD-10-CM | POA: Diagnosis not present

## 2019-01-22 DIAGNOSIS — D2262 Melanocytic nevi of left upper limb, including shoulder: Secondary | ICD-10-CM | POA: Diagnosis not present

## 2019-01-22 DIAGNOSIS — L814 Other melanin hyperpigmentation: Secondary | ICD-10-CM | POA: Diagnosis not present

## 2019-02-04 ENCOUNTER — Encounter: Payer: Self-pay | Admitting: Adult Health

## 2019-02-04 ENCOUNTER — Other Ambulatory Visit: Payer: Self-pay

## 2019-02-04 ENCOUNTER — Ambulatory Visit (INDEPENDENT_AMBULATORY_CARE_PROVIDER_SITE_OTHER): Payer: Medicare HMO | Admitting: Adult Health

## 2019-02-04 VITALS — BP 128/80 | Temp 97.8°F | Ht 73.5 in | Wt 183.0 lb

## 2019-02-04 DIAGNOSIS — Z1159 Encounter for screening for other viral diseases: Secondary | ICD-10-CM

## 2019-02-04 DIAGNOSIS — N529 Male erectile dysfunction, unspecified: Secondary | ICD-10-CM

## 2019-02-04 DIAGNOSIS — Z0000 Encounter for general adult medical examination without abnormal findings: Secondary | ICD-10-CM

## 2019-02-04 DIAGNOSIS — E781 Pure hyperglyceridemia: Secondary | ICD-10-CM | POA: Diagnosis not present

## 2019-02-04 DIAGNOSIS — Z23 Encounter for immunization: Secondary | ICD-10-CM

## 2019-02-04 DIAGNOSIS — N401 Enlarged prostate with lower urinary tract symptoms: Secondary | ICD-10-CM

## 2019-02-04 DIAGNOSIS — R351 Nocturia: Secondary | ICD-10-CM | POA: Diagnosis not present

## 2019-02-04 LAB — COMPREHENSIVE METABOLIC PANEL
ALT: 17 U/L (ref 0–53)
AST: 26 U/L (ref 0–37)
Albumin: 4.9 g/dL (ref 3.5–5.2)
Alkaline Phosphatase: 100 U/L (ref 39–117)
BUN: 17 mg/dL (ref 6–23)
CO2: 29 mEq/L (ref 19–32)
Calcium: 9.9 mg/dL (ref 8.4–10.5)
Chloride: 102 mEq/L (ref 96–112)
Creatinine, Ser: 1.06 mg/dL (ref 0.40–1.50)
GFR: 68.37 mL/min (ref >60.00)
Glucose, Bld: 83 mg/dL (ref 70–99)
Potassium: 4.2 mEq/L (ref 3.5–5.1)
Sodium: 140 mEq/L (ref 135–145)
Total Bilirubin: 0.8 mg/dL (ref 0.2–1.2)
Total Protein: 7.1 g/dL (ref 6.0–8.3)

## 2019-02-04 LAB — TSH: TSH: 2.86 u[IU]/mL (ref 0.35–4.50)

## 2019-02-04 LAB — CBC WITH DIFFERENTIAL/PLATELET
Basophils Absolute: 0.1 10*3/uL (ref 0.0–0.1)
Basophils Relative: 1 % (ref 0.0–3.0)
Eosinophils Absolute: 0.2 10*3/uL (ref 0.0–0.7)
Eosinophils Relative: 4.3 % (ref 0.0–5.0)
HCT: 44.2 % (ref 39.0–52.0)
Hemoglobin: 14.9 g/dL (ref 13.0–17.0)
Lymphocytes Relative: 27.3 % (ref 12.0–46.0)
Lymphs Abs: 1.5 10*3/uL (ref 0.7–4.0)
MCHC: 33.7 g/dL (ref 30.0–36.0)
MCV: 90.6 fl (ref 78.0–100.0)
Monocytes Absolute: 0.5 10*3/uL (ref 0.1–1.0)
Monocytes Relative: 9.3 % (ref 3.0–12.0)
Neutro Abs: 3.2 10*3/uL (ref 1.4–7.7)
Neutrophils Relative %: 58.1 % (ref 43.0–77.0)
Platelets: 229 10*3/uL (ref 150.0–400.0)
RBC: 4.88 Mil/uL (ref 4.22–5.81)
RDW: 13.3 % (ref 11.5–15.5)
WBC: 5.4 10*3/uL (ref 4.0–10.5)

## 2019-02-04 LAB — LIPID PANEL
Cholesterol: 158 mg/dL (ref 0–200)
HDL: 42.3 mg/dL (ref >39.00)
LDL Cholesterol: 95 mg/dL (ref 0–99)
NonHDL: 115.31
Total CHOL/HDL Ratio: 4
Triglycerides: 100 mg/dL (ref 0.0–149.0)
VLDL: 20 mg/dL (ref 0.0–40.0)

## 2019-02-04 LAB — PSA: PSA: 3.42 ng/mL (ref 0.10–4.00)

## 2019-02-04 MED ORDER — GEMFIBROZIL 600 MG PO TABS: 600.0000 mg | ORAL_TABLET | Freq: Two times a day (BID) | ORAL | 3 refills | Status: DC

## 2019-02-04 NOTE — Progress Notes (Signed)
Subjective:    Patient ID: Palani Kerwick, male    DOB: 1946/05/31, 73 y.o.   MRN: [id number]  HPI Patient presents for yearly preventative medicine examination. He is a pleasant 73 year old male who  has a past medical history of Personal history of colonic polyps (12/25/2010).   Hyperlipidemia -is prescribed below.  And a daily baby aspirin.  He denies myalgias or fatigue.  Has not experienced chest pain or shortness of breath. Lab Results  Component Value Date   CHOL 152 11/06/2017   HDL 40.90 11/06/2017   LDLCALC 80 11/06/2017   TRIG 153.0 (H) 11/06/2017   CHOLHDL 4 11/06/2017   ED- takes Viagra as needed  BPH - not currently experiencing any symptoms   All immunizations and health maintenance protocols were reviewed with the patient and needed orders were placed.  Appropriate screening laboratory values were ordered for the patient including screening of hyperlipidemia, renal function and hepatic function. If indicated by BPH, a PSA was ordered.  Medication reconciliation,  past medical history, social history, problem list and allergies were reviewed in detail with the patient  Goals were established with regard to weight loss, exercise, and  diet in compliance with medications.  He continues to eat a heart healthy diet and walks at least 3 miles every day. Wt Readings from Last 3 Encounters:  02/04/19 183 lb (83 kg)  09/03/18 188 lb (85.3 kg)  11/26/17 189 lb 6.4 oz (85.9 kg)   End of life planning was discussed.  He is up-to-date on routine colon cancer screening as well as dental and vision screens.  He was seen by Dermatology two months ago for his annual check. 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   Review of Systems  Constitutional: Negative.   HENT: Negative.   Eyes: Negative.   Respiratory: Negative.   Cardiovascular: Negative.   Gastrointestinal: Negative.   Endocrine: Negative.   Genitourinary: Negative.   Musculoskeletal: Negative.   Skin: Negative.    Allergic/Immunologic: Negative.   Neurological: Negative.   Hematological: Negative.   Psychiatric/Behavioral: Negative.   All other systems reviewed and are negative.  Past Medical History:  Diagnosis Date  . Personal history of colonic polyps 12/25/2010    Social History   Socioeconomic History  . Marital status: Married    Spouse name: Not on file  . Number of children: 2  . Years of education: Not on file  . Highest education level: Not on file  Occupational History  . Occupation: Retired  Scientific laboratory technician  . Financial resource strain: Not on file  . Food insecurity    Worry: Not on file    Inability: Not on file  . Transportation needs    Medical: Not on file    Non-medical: Not on file  Tobacco Use  . Smoking status: Never Smoker  . Smokeless tobacco: Never Used  Substance and Sexual Activity  . Alcohol use: Yes    Alcohol/week: 0.0 standard drinks    Comment: socially  . Drug use: No  . Sexual activity: Not on file  Lifestyle  . Physical activity    Days per week: Not on file    Minutes per session: Not on file  . Stress: Not on file  Relationships  . Social Herbalist on phone: Not on file    Gets together: Not on file    Attends religious service: Not on file    Active member of club or organization:  Not on file    Attends meetings of clubs or organizations: Not on file    Relationship status: Not on file  . Intimate partner violence    Fear of current or ex partner: Not on file    Emotionally abused: Not on file    Physically abused: Not on file    Forced sexual activity: Not on file  Other Topics Concern  . Not on file  Social History Narrative  . Not on file    Past Surgical History:  Procedure Laterality Date  . COLONOSCOPY  12/25/2010   internal hemorrhoids  . COLONOSCOPY W/ POLYPECTOMY  2007   [place], [place]     right side  . TONSILECTOMY, ADENOIDECTOMY, BILATERAL MYRINGOTOMY AND TUBES      Family  History  Problem Relation Age of Onset  . Heart disease Mother   . Diabetes Mother   . Ulcerative colitis Mother   . Colon cancer Neg Hx   . Esophageal cancer Neg Hx   . Rectal cancer Neg Hx   . Stomach cancer Neg Hx     No Known Allergies  Current Outpatient Medications on File Prior to Visit  Medication Sig Dispense Refill  . aspirin 81 MG tablet Take 81 mg by mouth daily.      Marland Kitchen gemfibrozil (LOPID) 600 MG tablet Take 1 tablet (600 mg total) by mouth 2 (two) times daily before a meal. 180 tablet 0  . Multiple Vitamin (MULTIVITAMIN) tablet Take 1 tablet by mouth daily.      . sildenafil (REVATIO) 20 MG tablet Take 1 to 2 tabs 2 - 3 hours before sex 30 tablet 11   No current facility-administered medications on file prior to visit.     BP 128/80   Temp 97.8 F (36.6 C) (Temporal)   Ht 6' 1.5" (1.867 m)   Wt 183 lb (83 kg)   BMI 23.82 kg/m       Objective:   Physical Exam Vitals signs and nursing note reviewed.  Constitutional:      General: He is not in acute distress.    Appearance: Normal appearance. He is normal weight. He is not diaphoretic.  HENT:     Head: Normocephalic and atraumatic.     Right Ear: Tympanic membrane, ear canal and external ear normal. There is no impacted cerumen.     Left Ear: Tympanic membrane, ear canal and external ear normal. There is no impacted cerumen.     Nose: Nose normal. No congestion or rhinorrhea.     Mouth/Throat:     Mouth: Mucous membranes are moist.     Pharynx: Oropharynx is clear. No oropharyngeal exudate or posterior oropharyngeal erythema.  Eyes:     General: No scleral icterus.       Right eye: No discharge.        Left eye: No discharge.     Conjunctiva/sclera: Conjunctivae normal.     Pupils: Pupils are equal, round, and reactive to light.  Neck:     Musculoskeletal: Normal range of motion and neck supple.     Thyroid: No thyromegaly.     Vascular: No carotid bruit or JVD.     Trachea: No tracheal deviation.   Cardiovascular:     Rate and Rhythm: Normal rate and regular rhythm.     Pulses: Normal pulses.     Heart sounds: Normal heart sounds. No murmur. No friction rub. No gallop.   Pulmonary:  Effort: Pulmonary effort is normal. No respiratory distress.     Breath sounds: Normal breath sounds. No stridor. No wheezing, rhonchi or rales.  Chest:     Chest wall: No tenderness.  Abdominal:     General: Bowel sounds are normal. There is no distension.     Palpations: Abdomen is soft. There is no mass.     Tenderness: There is no abdominal tenderness. There is no right CVA tenderness, left CVA tenderness, guarding or rebound.     Hernia: No hernia is present.  Musculoskeletal: Normal range of motion.        General: No swelling, tenderness, deformity or signs of injury.     Right lower leg: No edema.     Left lower leg: No edema.  Lymphadenopathy:     Cervical: No cervical adenopathy.  Skin:    General: Skin is warm and dry.     Capillary Refill: Capillary refill takes less than 2 seconds.     Coloration: Skin is not jaundiced or pale.     Findings: No bruising, erythema, lesion or rash.     Comments: Quarter sized lipoma on right flank  Neurological:     General: No focal deficit present.     Mental Status: He is alert and oriented to person, place, and time.     Cranial Nerves: No cranial nerve deficit.     Sensory: No sensory deficit.     Motor: No weakness or abnormal muscle tone.     Coordination: Coordination normal.     Gait: Gait normal.     Deep Tendon Reflexes: Reflexes are normal and symmetric. Reflexes normal.  Psychiatric:        Mood and Affect: Mood normal.        Behavior: Behavior normal.        Thought Content: Thought content normal.        Judgment: Judgment normal.       Assessment & Plan:  1. HYPERTRIGLYCERIDEMIA - Continue heart healthy diet and exercise  - CBC with Differential/Platelet - Comprehensive metabolic panel - Lipid panel - TSH -  gemfibrozil (LOPID) 600 MG tablet; Take 1 tablet (600 mg total) by mouth 2 (two) times daily before a meal.  Dispense: 180 tablet; Refill: 3  2. Erectile dysfunction, unspecified erectile dysfunction type - Viagra as needed  3. BPH associated with nocturia  - PSA  4. Encounter for hepatitis C screening test for low risk patient  - Hep C Antibody   Dorothyann Peng, NP

## 2019-02-04 NOTE — Patient Instructions (Signed)
It was great seeing you today   We will follow up with you regarding your blood work   Continue to exercise and eat healthy   Please let me know if you need anything   Health Maintenance, Male A healthy lifestyle and preventative care can promote health and wellness.  Maintain regular health, dental, and eye exams.  Eat a healthy diet. Foods like vegetables, fruits, whole grains, low-fat dairy products, and lean protein foods contain the nutrients you need and are low in calories. Decrease your intake of foods high in solid fats, added sugars, and salt. Get information about a proper diet from your health care provider, if necessary.  Regular physical exercise is one of the most important things you can do for your health. Most adults should get at least 150 minutes of moderate-intensity exercise (any activity that increases your heart rate and causes you to sweat) each week. In addition, most adults need muscle-strengthening exercises on 2 or more days a week.   Maintain a healthy weight. The body mass index (BMI) is a screening tool to identify possible weight problems. It provides an estimate of body fat based on height and weight. Your health care provider can find your BMI and can help you achieve or maintain a healthy weight. For males 20 years and older:  A BMI below 18.5 is considered underweight.  A BMI of 18.5 to 24.9 is normal.  A BMI of 25 to 29.9 is considered overweight.  A BMI of 30 and above is considered obese.  Maintain normal blood lipids and cholesterol by exercising and minimizing your intake of saturated fat. Eat a balanced diet with plenty of fruits and vegetables. Blood tests for lipids and cholesterol should begin at age 64 and be repeated every 5 years. If your lipid or cholesterol levels are high, you are over age 27, or you are at high risk for heart disease, you may need your cholesterol levels checked more frequently.Ongoing high lipid and cholesterol levels  should be treated with medicines if diet and exercise are not working.  If you smoke, find out from your health care provider how to quit. If you do not use tobacco, do not start.  Lung cancer screening is recommended for adults aged 56-80 years who are at high risk for developing lung cancer because of a history of smoking. A yearly low-dose CT scan of the lungs is recommended for people who have at least a 30-pack-year history of smoking and are current smokers or have quit within the past 15 years. A pack year of smoking is smoking an average of 1 pack of cigarettes a day for 1 year (for example, a 30-pack-year history of smoking could mean smoking 1 pack a day for 30 years or 2 packs a day for 15 years). Yearly screening should continue until the smoker has stopped smoking for at least 15 years. Yearly screening should be stopped for people who develop a health problem that would prevent them from having lung cancer treatment.  If you choose to drink alcohol, do not have more than 2 drinks per day. One drink is considered to be 12 oz (360 mL) of beer, 5 oz (150 mL) of wine, or 1.5 oz (45 mL) of liquor.  Avoid the use of street drugs. Do not share needles with anyone. Ask for help if you need support or instructions about stopping the use of drugs.  High blood pressure causes heart disease and increases the risk of stroke. High  blood pressure is more likely to develop in:  People who have blood pressure in the end of the normal range (100-139/85-89 mm Hg).  People who are overweight or obese.  People who are African American.  If you are 74-39 years of age, have your blood pressure checked every 3-5 years. If you are 95 years of age or older, have your blood pressure checked every year. You should have your blood pressure measured twice--once when you are at a hospital or clinic, and once when you are not at a hospital or clinic. Record the average of the two measurements. To check your blood  pressure when you are not at a hospital or clinic, you can use:  An automated blood pressure machine at a pharmacy.  A home blood pressure monitor.  If you are 50-13 years old, ask your health care provider if you should take aspirin to prevent heart disease.  Diabetes screening involves taking a blood sample to check your fasting blood sugar level. This should be done once every 3 years after age 10 if you are at a normal weight and without risk factors for diabetes. Testing should be considered at a younger age or be carried out more frequently if you are overweight and have at least 1 risk factor for diabetes.  Colorectal cancer can be detected and often prevented. Most routine colorectal cancer screening begins at the age of 7 and continues through age 62. However, your health care provider may recommend screening at an earlier age if you have risk factors for colon cancer. On a yearly basis, your health care provider may provide home test kits to check for hidden blood in the stool. A small camera at the end of a tube may be used to directly examine the colon (sigmoidoscopy or colonoscopy) to detect the earliest forms of colorectal cancer. Talk to your health care provider about this at age 24 when routine screening begins. A direct exam of the colon should be repeated every 5-10 years through age 32, unless early forms of precancerous polyps or small growths are found.  People who are at an increased risk for hepatitis B should be screened for this virus. You are considered at high risk for hepatitis B if:  You were born in a country where hepatitis B occurs often. Talk with your health care provider about which countries are considered high risk.  Your parents were born in a high-risk country and you have not received a shot to protect against hepatitis B (hepatitis B vaccine).  You have HIV or AIDS.  You use needles to inject street drugs.  You live with, or have sex with, someone who  has hepatitis B.  You are a man who has sex with other men (MSM).  You get hemodialysis treatment.  You take certain medicines for conditions like cancer, organ transplantation, and autoimmune conditions.  Hepatitis C blood testing is recommended for all people born from [birth year] through 1965 and any individual with known risk factors for hepatitis C.  Healthy men should no longer receive prostate-specific antigen (PSA) blood tests as part of routine cancer screening. Talk to your health care provider about prostate cancer screening.  Testicular cancer screening is not recommended for adolescents or adult males who have no symptoms. Screening includes self-exam, a health care provider exam, and other screening tests. Consult with your health care provider about any symptoms you have or any concerns you have about testicular cancer.  Practice safe sex. Use condoms and  avoid high-risk sexual practices to reduce the spread of sexually transmitted infections (STIs).  You should be screened for STIs, including gonorrhea and chlamydia if:  You are sexually active and are younger than 24 years.  You are older than 24 years, and your health care provider tells you that you are at risk for this type of infection.  Your sexual activity has changed since you were last screened, and you are at an increased risk for chlamydia or gonorrhea. Ask your health care provider if you are at risk.  If you are at risk of being infected with HIV, it is recommended that you take a prescription medicine daily to prevent HIV infection. This is called pre-exposure prophylaxis (PrEP). You are considered at risk if:  You are a man who has sex with other men (MSM).  You are a heterosexual man who is sexually active with multiple partners.  You take drugs by injection.  You are sexually active with a partner who has HIV.  Talk with your health care provider about whether you are at high risk of being infected with  HIV. If you choose to begin PrEP, you should first be tested for HIV. You should then be tested every 3 months for as long as you are taking PrEP.  Use sunscreen. Apply sunscreen liberally and repeatedly throughout the day. You should seek shade when your shadow is shorter than you. Protect yourself by wearing long sleeves, pants, a wide-brimmed hat, and sunglasses year round whenever you are outdoors.  Tell your health care provider of new moles or changes in moles, especially if there is a change in shape or color. Also, tell your health care provider if a mole is larger than the size of a pencil eraser.  A one-time screening for abdominal aortic aneurysm (AAA) and surgical repair of large AAAs by ultrasound is recommended for men aged 66-75 years who are current or former smokers.  Stay current with your vaccines (immunizations).   This information is not intended to replace advice given to you by your health care provider. Make sure you discuss any questions you have with your health care provider.   Document Released: 11/23/2007 Document Revised: 06/17/2014 Document Reviewed: 10/22/2010 Elsevier Interactive Patient Education [employer].

## 2019-02-04 NOTE — Addendum Note (Signed)
Addended by: Miles Costain T on: 02/04/2019 10:02 AM   Modules accepted: Orders

## 2019-02-05 LAB — HEPATITIS C ANTIBODY
Hepatitis C Ab: NONREACTIVE
SIGNAL TO CUT-OFF: 0.01 (ref <1.00)

## 2019-04-22 DIAGNOSIS — H524 Presbyopia: Secondary | ICD-10-CM | POA: Diagnosis not present

## 2019-04-22 DIAGNOSIS — H5213 Myopia, bilateral: Secondary | ICD-10-CM | POA: Diagnosis not present

## 2019-04-22 DIAGNOSIS — H25813 Combined forms of age-related cataract, bilateral: Secondary | ICD-10-CM | POA: Diagnosis not present

## 2019-04-22 DIAGNOSIS — H35371 Puckering of macula, right eye: Secondary | ICD-10-CM | POA: Diagnosis not present

## 2019-04-22 DIAGNOSIS — H52223 Regular astigmatism, bilateral: Secondary | ICD-10-CM | POA: Diagnosis not present

## 2019-06-30 ENCOUNTER — Ambulatory Visit: Payer: Medicare Other | Attending: Internal Medicine

## 2019-06-30 DIAGNOSIS — Z23 Encounter for immunization: Secondary | ICD-10-CM

## 2019-06-30 NOTE — Progress Notes (Signed)
   Covid-[facility] Vaccination [facility]  Name:  Marcus Reynolds    MRN: [id number] DOB: 1946/04/17  06/30/2019  [name] was observed post Covid-19 immunization for 15 minutes without incidence. He was provided with Vaccine Information Sheet and instruction to access the V-Safe system.   [name] was instructed to call 911 with any severe reactions post vaccine: Marland Kitchen Difficulty breathing  . Swelling of your face and throat  . A fast heartbeat  . A bad rash all over your body  . Dizziness and weakness    Immunizations Administered    Name Date Dose VIS Date Route   Pfizer COVID-19 Vaccine 06/30/2019 12:38 PM 0.3 mL 05/21/2019 Intramuscular   Manufacturer: [place]   Lot: [id number]   [place]: [id number]

## 2019-07-09 ENCOUNTER — Ambulatory Visit (INDEPENDENT_AMBULATORY_CARE_PROVIDER_SITE_OTHER): Payer: Medicare HMO

## 2019-07-09 DIAGNOSIS — Z1211 Encounter for screening for malignant neoplasm of colon: Secondary | ICD-10-CM | POA: Diagnosis not present

## 2019-07-09 DIAGNOSIS — Z0000 Encounter for general adult medical examination without abnormal findings: Secondary | ICD-10-CM

## 2019-07-09 NOTE — Patient Instructions (Addendum)
Marcus Reynolds , Thank you for taking time to come for your Medicare Wellness Visit. I appreciate your ongoing commitment to your health goals. Please review the following plan we discussed and let me know if I can assist you in the future.   Screening recommendations/referrals: Colorectal Screening: colonoscopy 12/07/2014; due again 12/08/2019. Referral sent to [facility] for Dr. Silvano Rusk per patient request.   Vision and Dental Exams: Recommended annual ophthalmology exams for early detection of glaucoma and other disorders of the eye. Patient sees eye provider every 6 months. Recommended annual dental exams for proper oral hygiene. Patient's next appointment is in Feb 2021.  Diabetic Exams: Diabetic Eye Exam: N/A Diabetic Foot Exam: N/A  Vaccinations: Influenza vaccine: completed 02/04/2019; due again in Fall 2021. Pneumococcal vaccine: completed 07/09/2010 & 09/23/2013. Up to date.  Tdap vaccine: completed 09/27/2014; due again 09/26/2024. Shingles vaccine: completed 02/13/2019 & 06/15/2019.   Advanced directives: Advance directives discussed with you today. Please bring a copy of your POA (Power of [place]) and/or Living Will to your next appointment.  Goals: Recommend to drink at four 8oz glasses of water per day.  Continue to exercise for at least 150 minutes per week. You are exceeding this goal and doing a phenomenal job!  Recommend to remove any items from the home that may cause slips or trips.  Next appointment: Please schedule your Annual Wellness Visit with your Nurse Health Advisor in one year.  Preventive Care 74 Years and Older, Male Preventive care refers to lifestyle choices and visits with your health care provider that can promote health and wellness. What does preventive care include?  A yearly physical exam. This is also called an annual well check.  Dental exams once or twice a year.  Routine eye exams. Ask your health care provider how  often you should have your eyes checked.  Personal lifestyle choices, including:  Daily care of your teeth and gums.  Regular physical activity.  Eating a healthy diet.  Avoiding tobacco and drug use.  Limiting alcohol use.  Practicing safe sex.  Taking low doses of aspirin every day if recommended by your health care provider..  Taking vitamin and mineral supplements as recommended by your health care provider. What happens during an annual well check? The services and screenings done by your health care provider during your annual well check will depend on your age, overall health, lifestyle risk factors, and family history of disease. Counseling  Your health care provider may ask you questions about your:  Alcohol use.  Tobacco use.  Drug use.  Emotional well-being.  Home and relationship well-being.  Sexual activity.  Eating habits.  History of falls.  Memory and ability to understand (cognition).  Work and work Statistician. Screening  You may have the following tests or measurements:  Height, weight, and BMI.  Blood pressure.  Lipid and cholesterol levels. These may be checked every 5 years, or more frequently if you are over 45 years old.  Skin check.  Lung cancer screening. You may have this screening every year starting at age 82 if you have a 30-pack-year history of smoking and currently smoke or have quit within the past 15 years.  Fecal occult blood test (FOBT) of the stool. You may have this test every year starting at age 33.  Flexible sigmoidoscopy or colonoscopy. You may have a sigmoidoscopy every 5 years or a colonoscopy every 10 years starting at age 41.  Prostate cancer screening. Recommendations will vary depending on  your family history and other risks.  Hepatitis C blood test.  Hepatitis B blood test.  Sexually transmitted disease (STD) testing.  Diabetes screening. This is done by checking your blood sugar (glucose) after you  have not eaten for a while (fasting). You may have this done every 1-3 years.  Abdominal aortic aneurysm (AAA) screening. You may need this if you are a current or former smoker.  Osteoporosis. You may be screened starting at age 43 if you are at high risk. Talk with your health care provider about your test results, treatment options, and if necessary, the need for more tests. Vaccines  Your health care provider may recommend certain vaccines, such as:  Influenza vaccine. This is recommended every year.  Tetanus, diphtheria, and acellular pertussis (Tdap, Td) vaccine. You may need a Td booster every 10 years.  Zoster vaccine. You may need this after age 2.  Pneumococcal 13-valent conjugate (PCV13) vaccine. One dose is recommended after age 75.  Pneumococcal polysaccharide (PPSV23) vaccine. One dose is recommended after age 6. Talk to your health care provider about which screenings and vaccines you need and how often you need them. This information is not intended to replace advice given to you by your health care provider. Make sure you discuss any questions you have with your health care provider. Document Released: 06/23/2015 Document Revised: 02/14/2016 Document Reviewed: 03/28/2015 Elsevier Interactive Patient Education  2017 [place] Prevention in the Home Falls can cause injuries. They can happen to people of all ages. There are many things you can do to make your home safe and to help prevent falls. What can I do on the outside of my home?  Regularly fix the edges of walkways and driveways and fix any cracks.  Remove anything that might make you trip as you walk through a door, such as a raised step or threshold.  Trim any bushes or trees on the path to your home.  Use bright outdoor lighting.  Clear any walking paths of anything that might make someone trip, such as rocks or tools.  Regularly check to see if handrails are loose or broken. Make sure that  both sides of any steps have handrails.  Any raised decks and porches should have guardrails on the edges.  Have any leaves, snow, or ice cleared regularly.  Use sand or salt on walking paths during winter.  Clean up any spills in your garage right away. This includes oil or grease spills. What can I do in the bathroom?  Use night lights.  Install grab bars by the toilet and in the tub and shower. Do not use towel bars as grab bars.  Use non-skid mats or decals in the tub or shower.  If you need to sit down in the shower, use a plastic, non-slip stool.  Keep the floor dry. Clean up any water that spills on the floor as soon as it happens.  Remove soap buildup in the tub or shower regularly.  Attach bath mats securely with double-sided non-slip rug tape.  Do not have throw rugs and other things on the floor that can make you trip. What can I do in the bedroom?  Use night lights.  Make sure that you have a light by your bed that is easy to reach.  Do not use any sheets or blankets that are too big for your bed. They should not hang down onto the floor.  Have a firm chair that has side arms. You  can use this for support while you get dressed.  Do not have throw rugs and other things on the floor that can make you trip. What can I do in the kitchen?  Clean up any spills right away.  Avoid walking on wet floors.  Keep items that you use a lot in easy-to-reach places.  If you need to reach something above you, use a strong step stool that has a grab bar.  Keep electrical cords out of the way.  Do not use floor polish or wax that makes floors slippery. If you must use wax, use non-skid floor wax.  Do not have throw rugs and other things on the floor that can make you trip. What can I do with my stairs?  Do not leave any items on the stairs.  Make sure that there are handrails on both sides of the stairs and use them. Fix handrails that are broken or loose. Make sure  that handrails are as long as the stairways.  Check any carpeting to make sure that it is firmly attached to the stairs. Fix any carpet that is loose or worn.  Avoid having throw rugs at the top or bottom of the stairs. If you do have throw rugs, attach them to the floor with carpet tape.  Make sure that you have a light switch at the top of the stairs and the bottom of the stairs. If you do not have them, ask someone to add them for you. What else can I do to help prevent falls?  Wear shoes that:  Do not have high heels.  Have rubber bottoms.  Are comfortable and fit you well.  Are closed at the toe. Do not wear sandals.  If you use a stepladder:  Make sure that it is fully opened. Do not climb a closed stepladder.  Make sure that both sides of the stepladder are locked into place.  Ask someone to hold it for you, if possible.  Clearly mark and make sure that you can see:  Any grab bars or handrails.  First and last steps.  Where the edge of each step is.  Use tools that help you move around (mobility aids) if they are needed. These include:  Canes.  Walkers.  Scooters.  Crutches.  Turn on the lights when you go into a dark area. Replace any light bulbs as soon as they burn out.  Set up your furniture so you have a clear path. Avoid moving your furniture around.  If any of your floors are uneven, fix them.  If there are any pets around you, be aware of where they are.  Review your medicines with your doctor. Some medicines can make you feel dizzy. This can increase your chance of falling. Ask your doctor what other things that you can do to help prevent falls. This information is not intended to replace advice given to you by your health care provider. Make sure you discuss any questions you have with your health care provider. Document Released: 03/23/2009 Document Revised: 11/02/2015 Document Reviewed: 07/01/2014 Elsevier Interactive Patient Education   2017 [employer].

## 2019-07-09 NOTE — Progress Notes (Signed)
This visit is being conducted via phone call due to the COVID-19 pandemic. This patient has given me verbal consent via phone to conduct this visit, patient states they are participating from their home address. Some vital signs may be absent or patient reported.   Patient identification: identified by name, DOB, and current address.  Location provider: [facility], Office Persons participating in the virtual visit: Mr. Maurilio Loner and Franne Forts, LPN.    Subjective:   Marcus Reynolds is a 74 y.o. male who presents for an Initial Medicare Annual Wellness Visit.  Mr. Fairbairn is doing very well at this time. He is doing stretching exercises every day for his back, walking 3 miles five days a week, and working out two days per week. He reports eating 3 healthy, balanced meals every day.   Review of Systems  No ROS; Annual Medicare Wellness Initial Visit  Cardiac Risk Factors include: advanced age (>76men, >33 women);male gender    Objective:    Today's Vitals   07/09/19 0829  Weight: 185 lb (83.9 kg)  Height: 6\' 2"  (1.88 m)   Body mass index is 23.75 kg/m.  Advanced Directives 07/09/2019 12/02/2017 06/26/2017 12/07/2014  Does Patient Have a Medical Advance Directive? Yes No Yes Yes  Type of Paramedic of [place];Living will - [place];Living will [place];Living will  Does patient want to make changes to medical advance directive? No - Patient declined - No - Patient declined -  Copy of [place] in Chart? No - copy requested - - -  Would patient like information on creating a medical advance directive? - No - Patient declined - -    Current Medications (verified) Outpatient Encounter Medications as of 07/09/2019  Medication Sig  . aspirin 81 MG tablet Take 81 mg by mouth daily.    Marland Kitchen gemfibrozil (LOPID) 600 MG tablet Take 1 tablet (600 mg total) by mouth 2 (two) times daily before a meal.  . Multiple  Vitamin (MULTIVITAMIN) tablet Take 1 tablet by mouth daily.    . sildenafil (REVATIO) 20 MG tablet Take 1 to 2 tabs 2 - 3 hours before sex   No facility-administered encounter medications on file as of 07/09/2019.    Allergies (verified) Patient has no known allergies.   History: Past Medical History:  Diagnosis Date  . Personal history of colonic polyps 12/25/2010   Past Surgical History:  Procedure Laterality Date  . COLONOSCOPY  12/25/2010   internal hemorrhoids  . COLONOSCOPY W/ POLYPECTOMY  2007   [place], [place]     right side  . TONSILECTOMY, ADENOIDECTOMY, BILATERAL MYRINGOTOMY AND TUBES     Family History  Problem Relation Age of Onset  . Heart disease Mother   . Diabetes Mother   . Ulcerative colitis Mother   . Colon cancer Neg Hx   . Esophageal cancer Neg Hx   . Rectal cancer Neg Hx   . Stomach cancer Neg Hx    Social History   Socioeconomic History  . Marital status: Married    Spouse name: Not on file  . Number of children: 2  . Years of education: Not on file  . Highest education level: Professional school degree (e.g., MD, DDS, DVM, JD)  Occupational History  . Occupation: Retired  Tobacco Use  . Smoking status: Never Smoker  . Smokeless tobacco: Never Used  Substance and Sexual Activity  . Alcohol use: Yes  Alcohol/week: 1.0 standard drinks    Types: 1 Glasses of wine per week    Comment: socially  . Drug use: No  . Sexual activity: Not on file  Other Topics Concern  . Not on file  Social History Narrative   Retired   [employer] 2   Married    Volunteers at [employer]: [place]   . Difficulty of Paying Living Expenses: Not hard at all  Food Insecurity: No Food Insecurity  . Worried About Charity fundraiser in the Last Year: Never true  . Ran Out of Food in the Last Year: Never true  Transportation Needs: No Transportation Needs  . Lack of  Transportation (Medical): No  . Lack of Transportation (Non-Medical): No  Physical Activity: Sufficiently Active  . Days of Exercise per Week: 7 days  . Minutes of Exercise per Session: 60 min  Stress: No Stress Concern Present  . Feeling of Stress : Only a little  Social Connections: Not Isolated  . Frequency of Communication with Friends and Family: More than three times a week  . Frequency of Social Gatherings with Friends and Family: Twice a week  . Attends Religious Services: More than 4 times per year  . Active Member of Clubs or Organizations: Yes  . Attends Archivist Meetings: More than 4 times per year  . Marital Status: Married   Tobacco Counseling Counseling given: Not Answered   Clinical Intake:  Pre-visit preparation completed: Yes  Pain : No/denies pain(does report intermittent, chronic back pain with work up and treatment)     BMI - recorded: 23.75 Nutritional Status: BMI of 19-24  Normal Nutritional Risks: None Diabetes: No  How often do you need to have someone help you when you read instructions, pamphlets, or other written materials from your doctor or pharmacy?: 1 - Never What is the last grade level you completed in school?: college graduate  Interpreter Needed?: No  Information entered by :: Franne Forts, LPN.  Activities of Daily Living In your present state of health, do you have any difficulty performing the following activities: 07/09/2019  Hearing? Y  Vision? N  Difficulty concentrating or making decisions? N  Walking or climbing stairs? Y  Comment when having back pain  Dressing or bathing? N  Doing errands, shopping? N  Preparing Food and eating ? N  Using the Toilet? N  In the past six months, have you accidently leaked urine? N  Do you have problems with loss of bowel control? N  Managing your Medications? N  Managing your Finances? N  Housekeeping or managing your Housekeeping? N  Some recent data might be hidden      Immunizations and Health Maintenance Immunization History  Administered Date(s) Administered  . Fluad Quad(high Dose 65+) 02/04/2019  . Influenza Split 03/26/2011, 03/27/2012  . Influenza Whole 06/13/2009  . Influenza, High Dose Seasonal PF 03/14/2015, 02/29/2016, 03/13/2017, 03/13/2018  . Influenza,inj,Quad PF,6+ Mos 03/24/2013, 03/22/2014  . PFIZER SARS-COV-2 Vaccination 06/30/2019  . Pneumococcal Conjugate-13 09/23/2013  . Pneumococcal Polysaccharide-23 07/09/2010  . Td 09/27/2014  . Zoster 06/10/2006  . Zoster Recombinat (Shingrix) 02/13/2019, 06/15/2019   There are no preventive care reminders to display for this patient.  Patient Care Team: Dorothyann Peng, NP as PCP - General (Family Medicine)  Indicate any recent Medical Services you may have received from other than [facility] providers in the past year (date may be approximate).  Assessment:   This is a routine wellness examination for [place].  Hearing/Vision screen  Hearing Screening   125Hz  250Hz  500Hz  1000Hz  2000Hz  3000Hz  4000Hz  6000Hz  8000Hz   Right ear:           Left ear:           Comments: Left ear hearing loss; has hearing aid but that is not helpful  Vision Screening Comments: Wears glasses and sees eye provider every 6 months; has cataract issues  Dietary issues and exercise activities discussed: Current Exercise Habits: Home exercise routine;Structured exercise class, Type of exercise: walking;strength training/weights;calisthenics, Time (Minutes): 60, Frequency (Times/Week): 7, Weekly Exercise (Minutes/Week): 420, Intensity: Moderate, Exercise limited by: orthopedic condition(s)  Goals    . DIET - INCREASE WATER INTAKE    . Patient Stated     Visit daughter in [place] once pandemic settles       Depression Screen PHQ 2/9 Scores 07/09/2019 10/30/2017 10/23/2016 10/11/2015  PHQ - 2 Score 0 0 0 0    Fall Risk Fall Risk  07/09/2019 10/30/2017 10/23/2016 10/11/2015 09/27/2014  Falls in the past year? 0 No No No  No  Risk for fall due to : Medication side effect;Orthopedic patient - - - -  Follow up Falls evaluation completed;Education provided;Falls prevention discussed - - - -    Is the patient's home free of loose throw rugs in walkways, pet beds, electrical cords, etc?   yes      Grab bars in the bathroom? yes      Handrails on the stairs?   yes      Adequate lighting?   yes  Timed Get Up and Go performed: N/A due to telephone visit.  Cognitive Function:     6CIT Screen 07/09/2019  What Year? 0 points  What month? 0 points  What time? 0 points  Count back from 20 0 points  Months in reverse 0 points  Repeat phrase 0 points  Total Score 0    Screening Tests Health Maintenance  Topic Date Due  . COLONOSCOPY  12/07/2019  . TETANUS/TDAP  09/26/2024  . INFLUENZA VACCINE  Completed  . Hepatitis C Screening  Completed  . PNA vac Low Risk Adult  Completed    Qualifies for Shingles Vaccine? Patient has completed shingrix  Cancer Screenings: Lung: Low Dose CT Chest recommended if Age 93-80 years, 30 pack-year currently smoking OR have quit w/in 15years. Patient does not qualify. Colorectal: yes  Additional Screenings:  Hepatitis C Screening: completed 01/15/2019    Plan:   Mr. Delia Heady is currently up to date with preventive health screenings and immunizations. He has received #1 of 2 covid vaccines already and is schedule for vaccine #2.   I have personally reviewed and noted the following in the patient's chart:   . Medical and social history . Use of alcohol, tobacco or illicit drugs  . Current medications and supplements . Functional ability and status . Nutritional status . Physical activity . Advanced directives . List of other physicians . Hospitalizations, surgeries, and ER visits in previous 12 months . Vitals . Screenings to include cognitive, depression, and falls . Referrals and appointments  In addition, I have reviewed and discussed with patient certain  preventive protocols, quality metrics, and best practice recommendations. A written personalized care plan for preventive services as well as general preventive health recommendations were provided to patient.     Franne Forts, LPN   [phone]

## 2019-07-21 ENCOUNTER — Ambulatory Visit: Payer: Medicare HMO | Attending: Internal Medicine

## 2019-07-21 DIAGNOSIS — Z23 Encounter for immunization: Secondary | ICD-10-CM | POA: Insufficient documentation

## 2019-07-21 NOTE — Progress Notes (Signed)
   Covid-19 Vaccination Clinic  Name:  Marcus Reynolds    MRN: [id number] DOB: 1945/10/05  07/21/2019  [name] was observed post Covid-19 immunization for 15 minutes without incidence. He was provided with Vaccine Information Sheet and instruction to access the V-Safe system.   [name] was instructed to call 911 with any severe reactions post vaccine: Marland Kitchen Difficulty breathing  . Swelling of your face and throat  . A fast heartbeat  . A bad rash all over your body  . Dizziness and weakness    Immunizations Administered    Name Date Dose VIS Date Route   Pfizer COVID-19 Vaccine 07/21/2019 12:00 PM 0.3 mL 05/21/2019 Intramuscular   Manufacturer: Pace   Lot: [id number]   [place]: [id number]

## 2019-07-22 IMAGING — MR MR LUMBAR SPINE W/O CM
4 of 5 series · 25 of 48 positions shown · non-contrast
Comparison: Lumbar spine radiographs August 04, 2017

CLINICAL DATA: Low back pain radiating to bilateral upper thighs.
Improvement after injection September 2017.

EXAM:
MRI LUMBAR SPINE WITHOUT CONTRAST
TECHNIQUE: Multiplanar, multisequence MR imaging of the lumbar spine was
performed. No intravenous contrast was administered.

[Series 3: T2 post-contrast · sagittal · 4.0mm · 0.55mm/px · 6 of 14 slices shown]
[im 1/14]
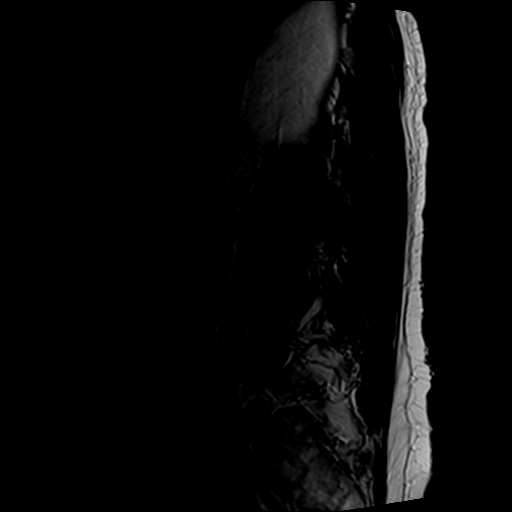
[im 3/14]
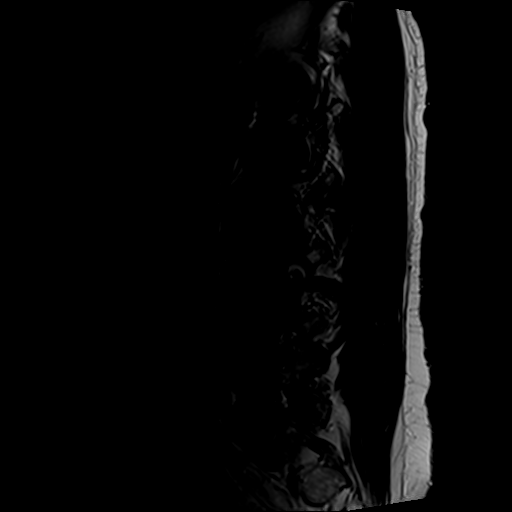
[im 6/14]
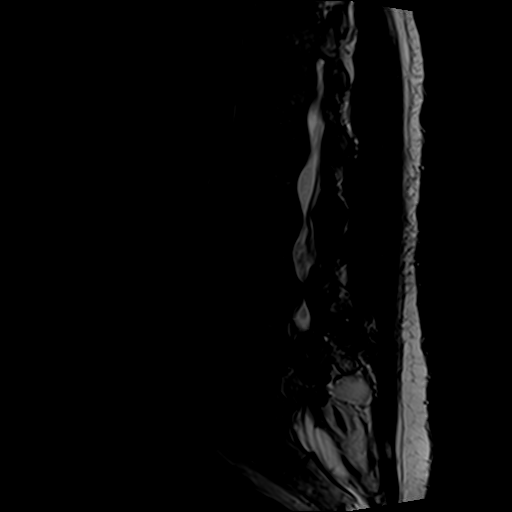
[im 8/14]
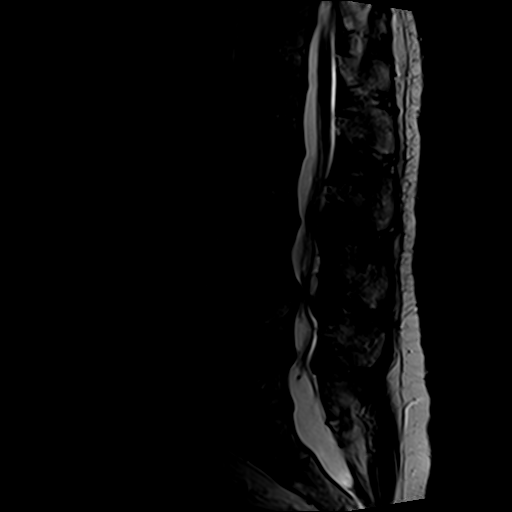
[im 11/14]
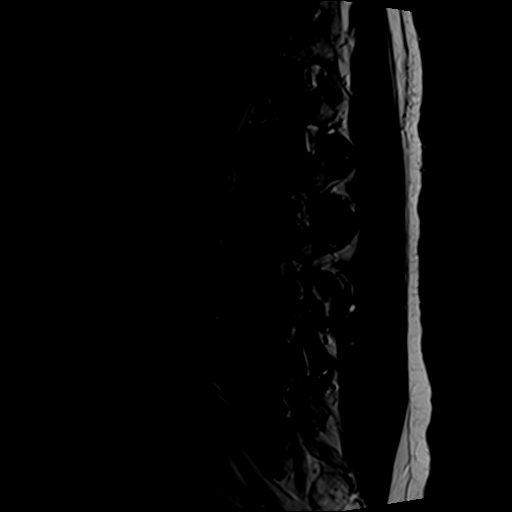
[im 14/14]
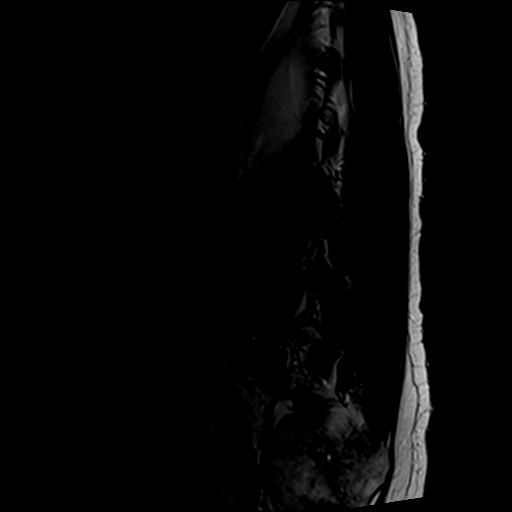

[Series 5: T1 · sagittal · 4.0mm · 0.55mm/px · 6 of 14 slices shown (1 of 2)]
[im 1/14]
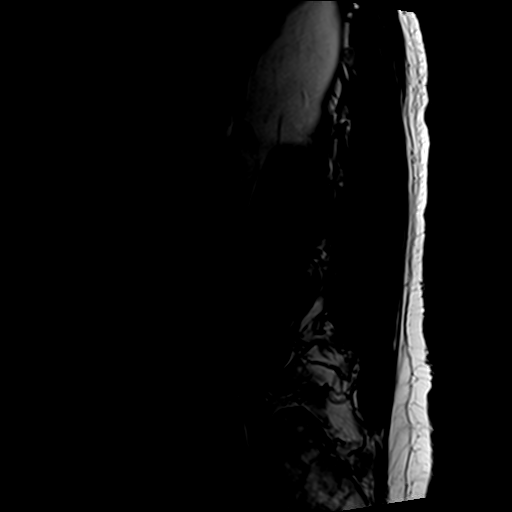
[im 3/14]
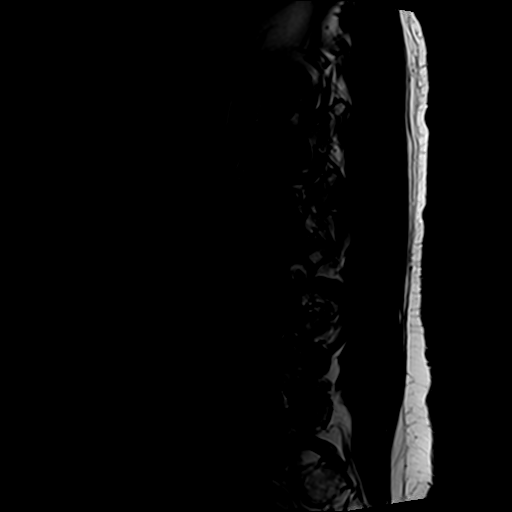
[im 6/14]
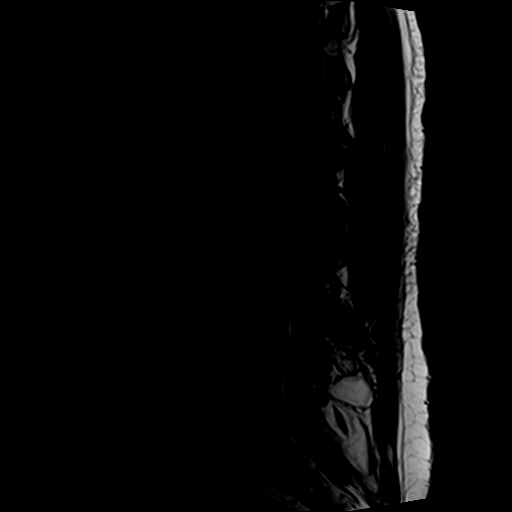
[im 8/14]
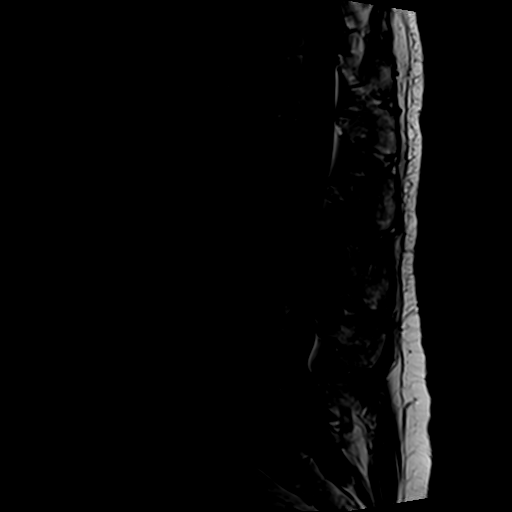
[im 11/14]
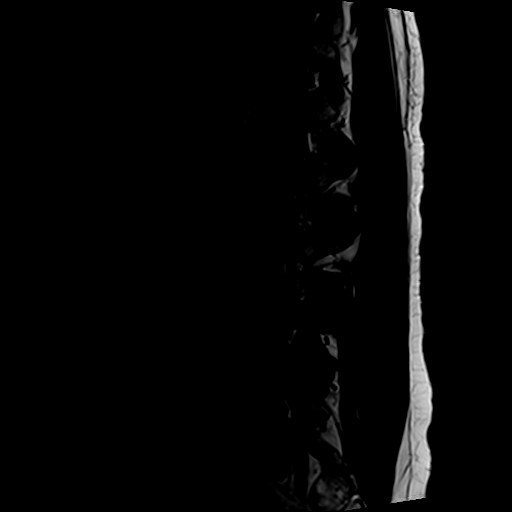
[im 14/14]
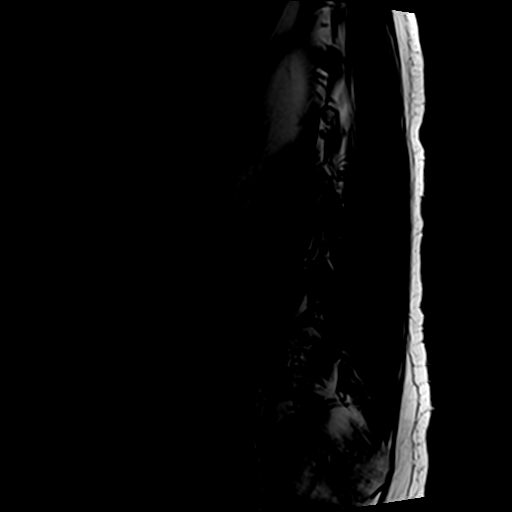

[Series 6: T1 · axial · 4.0mm · 0.35mm/px · z∈[-38,+137]mm · 4 of 36 slices shown (2 of 2)]
[im 1/36]
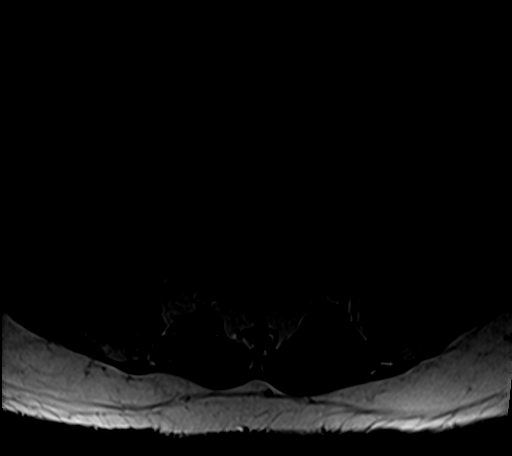
[im 6/36]
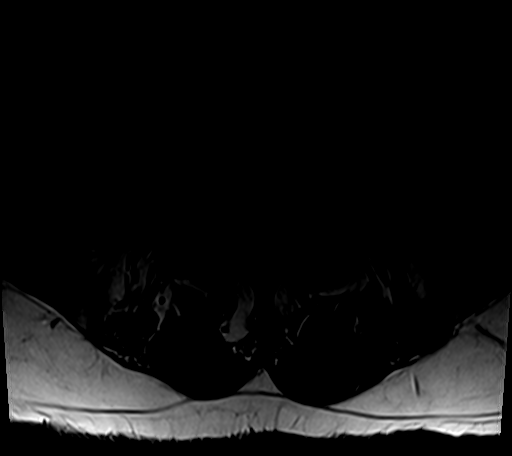
[im 18/36]
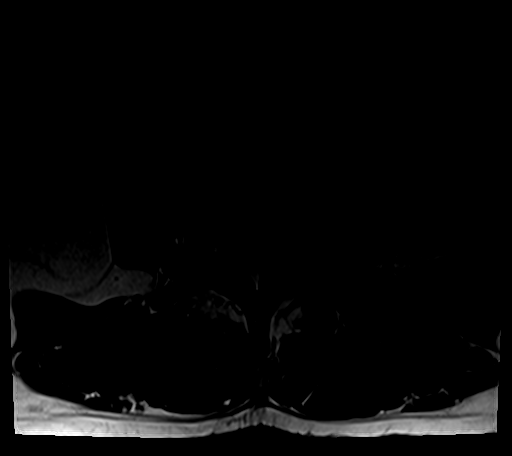
[im 31/36]
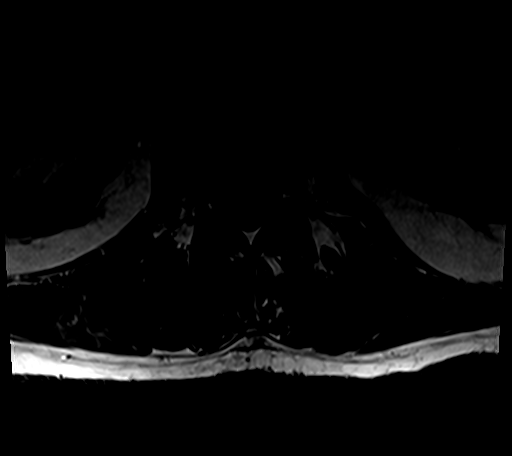

[Series 7: T2 · axial · 4.0mm · 0.70mm/px · z∈[-38,+177]mm · 9 of 36 slices shown]
[im 1/36]
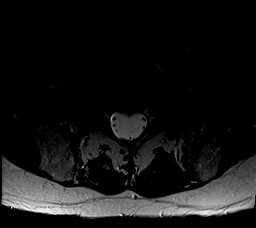
[im 6/36]
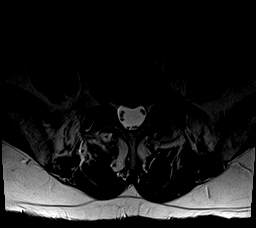
[im 11/36]
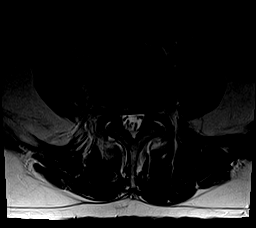
[im 16/36]
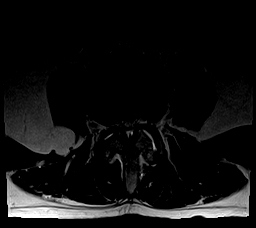
[im 18/36]
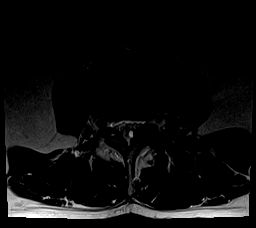
[im 21/36]
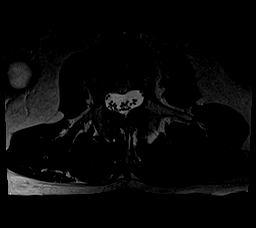
[im 26/36]
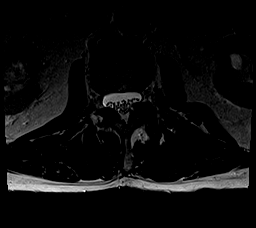
[im 31/36]
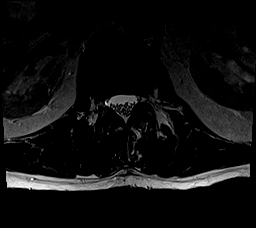
[im 36/36]
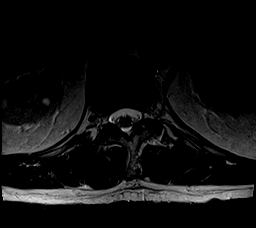

[25 of 48 positions shown; findings below may reference images not displayed]

FINDINGS: SEGMENTATION: For the purposes of this report, the last well-formed
intervertebral disc is reported as L5-S1. sacralized L5 vertebral
body.

ALIGNMENT: Straightened lumbar lordosis. Grade 1 L3-4
anterolisthesis. No spondylolysis.

VERTEBRAE:Vertebral bodies are intact. Developmentally smaller L5-S1
disc. Diffuse disc desiccation. Multilevel mild chronic discogenic
endplate changes. No abnormal or acute bone marrow signal.

CONUS MEDULLARIS AND CAUDA EQUINA: Conus medullaris terminates at
L1-2 and demonstrates normal morphology and signal characteristics.
Cauda equina is normal.

PARASPINAL AND OTHER SOFT TISSUES: Included prevertebral and
paraspinal soft tissues are normal.

DISC LEVELS:

T12-L1, L1-2: Annular bulging. Mild facet arthropathy. No canal
stenosis or neural foraminal narrowing.

L2-3: Small broad-based disc bulge. Moderate to severe facet
arthropathy and ligamentum flavum redundancy. 10 mm LEFT facet
synovial cyst within ligamentum flavum. Moderate canal stenosis,
there LEFT lateral recess could affect the traversing LEFT L3 nerve.
No neural foraminal narrowing.

L3-4: Anterolisthesis. Moderate broad-based disc bulge. Moderate to
severe facet arthropathy with bilateral facet effusions. 5 mm LEFT
facet synovial cyst, potentially rupture through the ligamentum
flavum. Severe canal stenosis with narrowed lateral recesses, this
could affect the traversing L4 nerve. Mild bilateral neural
foraminal narrowing.

L4-5: Moderate broad-based disc bulge, annular fissure. Moderate
facet arthropathy and ligamentum flavum redundancy. Moderate canal
stenosis. Moderate RIGHT, mild LEFT neural foraminal narrowing.

L5-S1: Moderate RIGHT central to subarticular disc protrusion.
Moderate facet arthropathy. No canal stenosis. Mild bilateral neural
foraminal narrowing.
IMPRESSION: 1. No fracture.  Grade 1 L3-4 anterolisthesis without spondylolysis.
2. Degenerative change of the lumbar spine. Severe canal stenosis
L3-4. Moderate canal stenosis L2-3 and L4-5.
3. Neural foraminal narrowing L3-4 through L5-S1: Moderate on the
RIGHT at L4-5.

## 2019-08-04 ENCOUNTER — Encounter: Payer: Self-pay | Admitting: Adult Health

## 2019-11-16 ENCOUNTER — Encounter: Payer: Self-pay | Admitting: Internal Medicine

## 2020-01-03 ENCOUNTER — Ambulatory Visit (AMBULATORY_SURGERY_CENTER): Payer: Self-pay | Admitting: *Deleted

## 2020-01-03 ENCOUNTER — Other Ambulatory Visit: Payer: Self-pay

## 2020-01-03 VITALS — Ht 73.5 in | Wt 185.4 lb

## 2020-01-03 DIAGNOSIS — Z86010 Personal history of colonic polyps: Secondary | ICD-10-CM

## 2020-01-03 NOTE — Progress Notes (Signed)
Completed covid vaccines 07-21-19  Pt is aware that care partner will wait in the car during procedure; if they feel like they will be too hot or cold to wait in the car; they may wait in the 4 th floor lobby. Patient is aware to bring only one care partner. We want them to wear a mask (we do not have any that we can provide them), practice social distancing, and we will check their temperatures when they get here.  I did remind the patient that their care partner needs to stay in the parking lot the entire time and have a cell phone available, we will call them when the pt is ready for discharge. Patient will wear mask into building.   No trouble with anesthesia, difficulty with intubation or hx/fam hx of malignant hyperthermia per pt  Pt takes prune juice daily for some constipation- 2 day MIralax/Miralax prep given   No egg or soy allergy  No home oxygen use   No medications for weight loss taken

## 2020-01-04 ENCOUNTER — Encounter: Payer: Self-pay | Admitting: Internal Medicine

## 2020-01-17 ENCOUNTER — Encounter: Payer: Self-pay | Admitting: Internal Medicine

## 2020-01-17 ENCOUNTER — Other Ambulatory Visit: Payer: Self-pay

## 2020-01-17 ENCOUNTER — Ambulatory Visit (AMBULATORY_SURGERY_CENTER): Payer: Medicare HMO | Admitting: Internal Medicine

## 2020-01-17 VITALS — BP 115/73 | HR 64 | Temp 97.8°F | Resp 12 | Ht 74.0 in | Wt 185.0 lb

## 2020-01-17 DIAGNOSIS — D125 Benign neoplasm of sigmoid colon: Secondary | ICD-10-CM | POA: Diagnosis not present

## 2020-01-17 DIAGNOSIS — Z860100 Personal history of colon polyps, unspecified: Secondary | ICD-10-CM

## 2020-01-17 DIAGNOSIS — E785 Hyperlipidemia, unspecified: Secondary | ICD-10-CM | POA: Diagnosis not present

## 2020-01-17 MED ORDER — SODIUM CHLORIDE 0.9 % IV SOLN: 500.0000 mL | Freq: Once | INTRAVENOUS | Status: DC

## 2020-01-17 NOTE — Progress Notes (Signed)
Report to PACU, RN, vss, BBS= Clear.  

## 2020-01-17 NOTE — Progress Notes (Signed)
Called to room to assist during endoscopic procedure.  Patient ID and intended procedure confirmed with present staff. Received instructions for my participation in the procedure from the performing physician.  

## 2020-01-17 NOTE — Patient Instructions (Addendum)
I found and removed 3 tiny polyps today.  I will review the pathology analysis and let you know. I do not think I will recommend any routine repeat colonoscopy.  I appreciate the opportunity to care for you. Gatha Mayer, MD, [facility]  Handouts provided on polyps.    YOU HAD AN ENDOSCOPIC PROCEDURE TODAY AT [facility]:   Refer to the procedure report that was given to you for any specific questions about what was found during the examination.  If the procedure report does not answer your questions, please call your gastroenterologist to clarify.  If you requested that your care partner not be given the details of your procedure findings, then the procedure report has been included in a sealed envelope for you to review at your convenience later.  YOU SHOULD EXPECT: Some feelings of bloating in the abdomen. Passage of more gas than usual.  Walking can help get rid of the air that was put into your GI tract during the procedure and reduce the bloating. If you had a lower endoscopy (such as a colonoscopy or flexible sigmoidoscopy) you may notice spotting of blood in your stool or on the toilet paper. If you underwent a bowel prep for your procedure, you may not have a normal bowel movement for a few days.  Please Note:  You might notice some irritation and congestion in your nose or some drainage.  This is from the oxygen used during your procedure.  There is no need for concern and it should clear up in a day or so.  SYMPTOMS TO REPORT IMMEDIATELY:   Following lower endoscopy (colonoscopy or flexible sigmoidoscopy):  Excessive amounts of blood in the stool  Significant tenderness or worsening of abdominal pains  Swelling of the abdomen that is new, acute  Fever of 100F or higher  For urgent or emergent issues, a gastroenterologist can be reached at any hour by calling [phone]. Do not use MyChart messaging for urgent concerns.    DIET:  We do recommend a small meal  at first, but then you may proceed to your regular diet.  Drink plenty of fluids but you should avoid alcoholic beverages for 24 hours.  ACTIVITY:  You should plan to take it easy for the rest of today and you should NOT DRIVE or use heavy machinery until tomorrow (because of the sedation medicines used during the test).    FOLLOW UP: Our staff will call the number listed on your records 48-72 hours following your procedure to check on you and address any questions or concerns that you may have regarding the information given to you following your procedure. If we do not reach you, we will leave a message.  We will attempt to reach you two times.  During this call, we will ask if you have developed any symptoms of COVID 19. If you develop any symptoms (ie: fever, flu-like symptoms, shortness of breath, cough etc.) before then, please call [phone].  If you test positive for Covid 19 in the 2 weeks post procedure, please call and report this information to [place].    If any biopsies were taken you will be contacted by phone or by letter within the next 1-3 weeks.  Please call us at [phone] if you have not heard about the biopsies in 3 weeks.    SIGNATURES/CONFIDENTIALITY: You and/or your care partner have signed paperwork which will be entered into your electronic medical record.  These signatures attest to the  fact that that the information above on your After Visit Summary has been reviewed and is understood.  Full responsibility of the confidentiality of this discharge information lies with you and/or your care-partner.

## 2020-01-17 NOTE — Progress Notes (Signed)
Pt's states no medical or surgical changes since previsit or office visit.  Vitals- Courtney 

## 2020-01-17 NOTE — Op Note (Signed)
[facility] Patient Name: Marcus Reynolds Procedure Date: 01/17/2020 11:03 AM MRN: [id number] Endoscopist: Gatha Mayer , MD Age: 74 Referring MD:  Date of Birth: 09-22-1945 Gender: Male Account #: [id number] Procedure:                Colonoscopy Indications:              Surveillance: Personal history of adenomatous                            polyps on last colonoscopy 5 years ago Medicines:                Propofol per Anesthesia, Monitored Anesthesia Care Procedure:                Pre-Anesthesia Assessment:                           - Prior to the procedure, a History and Physical                            was performed, and patient medications and                            allergies were reviewed. The patient's tolerance of                            previous anesthesia was also reviewed. The risks                            and benefits of the procedure and the sedation                            options and risks were discussed with the patient.                            All questions were answered, and informed consent                            was obtained. Prior Anticoagulants: The patient has                            taken no previous anticoagulant or antiplatelet                            agents. ASA Grade Assessment: II - A patient with                            mild systemic disease. After reviewing the risks                            and benefits, the patient was deemed in                            satisfactory condition to undergo the procedure.  After obtaining informed consent, the colonoscope                            was passed under direct vision. Throughout the                            procedure, the patient's blood pressure, pulse, and                            oxygen saturations were monitored continuously. The                            Colonoscope was introduced through the anus and                             advanced to the the cecum, identified by                            appendiceal orifice and ileocecal valve. The                            colonoscopy was performed without difficulty. The                            patient tolerated the procedure well. The quality                            of the bowel preparation was good. The ileocecal                            valve, appendiceal orifice, and rectum were                            photographed. The bowel preparation used was                            Miralax via split dose instruction. Scope In: 11:15:58 AM Scope Out: 11:32:36 AM Scope Withdrawal Time: 0 hours 13 minutes 16 seconds  Total Procedure Duration: 0 hours 16 minutes 38 seconds  Findings:                 The perianal and digital rectal examinations were                            normal.                           Three sessile polyps were found in the sigmoid                            colon. The polyps were diminutive in size. These                            polyps were removed with a cold snare. Resection  and retrieval were complete. Verification of                            patient identification for the specimen was done.                            Estimated blood loss was minimal.                           The exam was otherwise without abnormality on                            direct and retroflexion views. Complications:            No immediate complications. Estimated Blood Loss:     Estimated blood loss was minimal. Impression:               - Three diminutive polyps in the sigmoid colon,                            removed with a cold snare. Resected and retrieved.                           - The examination was otherwise normal on direct                            and retroflexion views.                           - Personal history of colonic polyps. 2007 and then                            2 adenomas max 8 mm 2016 Recommendation:            - Patient has a contact number available for                            emergencies. The signs and symptoms of potential                            delayed complications were discussed with the                            patient. Return to normal activities tomorrow.                            Written discharge instructions were provided to the                            patient.                           - Resume previous diet.                           - Continue present medications.                           -  No recommendation at this time regarding repeat                            colonoscopy due to age. Suspect will stop routine                            colonoscopy. Gatha Mayer, MD 01/17/2020 11:41:23 AM This report has been signed electronically.

## 2020-01-19 ENCOUNTER — Telehealth: Payer: Self-pay | Admitting: *Deleted

## 2020-01-19 ENCOUNTER — Telehealth: Payer: Self-pay

## 2020-01-19 NOTE — Telephone Encounter (Signed)
No answer for post procedure call back. Left message for patient to call with questions or concerns. 

## 2020-01-19 NOTE — Telephone Encounter (Signed)
First post procedure follow up call, no answer 

## 2020-01-25 ENCOUNTER — Encounter: Payer: Self-pay | Admitting: Internal Medicine

## 2020-01-28 DIAGNOSIS — C44612 Basal cell carcinoma of skin of right upper limb, including shoulder: Secondary | ICD-10-CM | POA: Diagnosis not present

## 2020-01-28 DIAGNOSIS — C44519 Basal cell carcinoma of skin of other part of trunk: Secondary | ICD-10-CM | POA: Diagnosis not present

## 2020-01-28 DIAGNOSIS — L821 Other seborrheic keratosis: Secondary | ICD-10-CM | POA: Diagnosis not present

## 2020-01-28 DIAGNOSIS — L814 Other melanin hyperpigmentation: Secondary | ICD-10-CM | POA: Diagnosis not present

## 2020-01-28 DIAGNOSIS — L570 Actinic keratosis: Secondary | ICD-10-CM | POA: Diagnosis not present

## 2020-01-28 DIAGNOSIS — Z85828 Personal history of other malignant neoplasm of skin: Secondary | ICD-10-CM | POA: Diagnosis not present

## 2020-02-08 ENCOUNTER — Encounter: Payer: Self-pay | Admitting: Adult Health

## 2020-02-08 ENCOUNTER — Ambulatory Visit (INDEPENDENT_AMBULATORY_CARE_PROVIDER_SITE_OTHER): Payer: Medicare HMO | Admitting: Adult Health

## 2020-02-08 ENCOUNTER — Other Ambulatory Visit: Payer: Self-pay

## 2020-02-08 VITALS — BP 110/70 | HR 70 | Temp 97.8°F | Ht 74.0 in | Wt 182.4 lb

## 2020-02-08 DIAGNOSIS — Z0000 Encounter for general adult medical examination without abnormal findings: Secondary | ICD-10-CM | POA: Diagnosis not present

## 2020-02-08 DIAGNOSIS — E781 Pure hyperglyceridemia: Secondary | ICD-10-CM

## 2020-02-08 DIAGNOSIS — R351 Nocturia: Secondary | ICD-10-CM

## 2020-02-08 DIAGNOSIS — N401 Enlarged prostate with lower urinary tract symptoms: Secondary | ICD-10-CM | POA: Diagnosis not present

## 2020-02-08 MED ORDER — GEMFIBROZIL 600 MG PO TABS: 600.0000 mg | ORAL_TABLET | Freq: Two times a day (BID) | ORAL | 3 refills | Status: DC

## 2020-02-08 NOTE — Progress Notes (Signed)
Subjective:    Patient ID: Marcus Reynolds, male    DOB: 03-03-1946, 74 y.o.   MRN: [id number]  HPI  Patient presents for yearly preventative medicine examination. He is a pleasant 74 year old male who  has a past medical history of Hyperlipidemia and Personal history of colonic polyps (12/25/2010).  Hyperlipidemia -currently prescribed Lopid 600 mg twice daily and 81 mg aspirin daily.  He denies myalgia or fatigue Lab Results  Component Value Date   CHOL 158 02/04/2019   HDL 42.30 02/04/2019   LDLCALC 95 02/04/2019   TRIG 100.0 02/04/2019   CHOLHDL 4 02/04/2019    BPH-asymptomatic   All immunizations and health maintenance protocols were reviewed with the patient and needed orders were placed.  Appropriate screening laboratory values were ordered for the patient including screening of hyperlipidemia, renal function and hepatic function. If indicated by BPH, a PSA was ordered.  Medication reconciliation,  past medical history, social history, problem list and allergies were reviewed in detail with the patient  Goals were established with regard to weight loss, exercise, and  diet in compliance with medications. He walks atleast 3 miles a day and goes to the gym a few days a week to work out. He tries to eat healthy   He is up-to-date on routine colon cancer screening his last colonoscopy being done in August 2021   Review of Systems  Constitutional: Negative.   HENT: Negative.   Eyes: Negative.   Respiratory: Negative.   Cardiovascular: Negative.   Gastrointestinal: Negative.   Endocrine: Negative.   Genitourinary: Negative.   Musculoskeletal: Positive for arthralgias (right knee ).  Skin: Negative.   Allergic/Immunologic: Negative.   Neurological: Negative.   Hematological: Negative.   Psychiatric/Behavioral: Negative.   All other systems reviewed and are negative.  Past Medical History:  Diagnosis Date  . Hyperlipidemia    high tri  . Personal history of colonic  polyps 12/25/2010    Social History   Socioeconomic History  . Marital status: Married    Spouse name: Not on file  . Number of children: 2  . Years of education: Not on file  . Highest education level: Professional school degree (e.g., MD, DDS, DVM, JD)  Occupational History  . Occupation: Retired  Tobacco Use  . Smoking status: Never Smoker  . Smokeless tobacco: Never Used  Vaping Use  . Vaping Use: Never used  Substance and Sexual Activity  . Alcohol use: Yes    Alcohol/week: 1.0 standard drink    Types: 1 Glasses of wine per week    Comment: socially  . Drug use: No  . Sexual activity: Not on file  Other Topics Concern  . Not on file  Social History Narrative   Retired   [employer]   Married    Volunteers at [employer]: [place]   . Difficulty of Paying Living Expenses: Not hard at all  Food Insecurity: No Food Insecurity  . Worried About Charity fundraiser in the Last Year: Never true  . Ran Out of Food in the Last Year: Never true  Transportation Needs: No Transportation Needs  . Lack of Transportation (Medical): No  . Lack of Transportation (Non-Medical): No  Physical Activity: Sufficiently Active  . Days of Exercise per Week: 7 days  . Minutes of Exercise per Session: 60 min  Stress: No Stress Concern Present  . Feeling of Stress :  Only a little  Social Connections: Socially Integrated  . Frequency of Communication with Friends and Family: More than three times a week  . Frequency of Social Gatherings with Friends and Family: Twice a week  . Attends Religious Services: More than 4 times per year  . Active Member of Clubs or Organizations: Yes  . Attends Archivist Meetings: More than 4 times per year  . Marital Status: Married  Human resources officer Violence:   . Fear of Current or Ex-Partner: Not on file  . Emotionally Abused: Not on file  . Physically Abused: Not on file  .  Sexually Abused: Not on file    Past Surgical History:  Procedure Laterality Date  . COLONOSCOPY  12/25/2010   internal hemorrhoids  . COLONOSCOPY W/ POLYPECTOMY  2007   [place], [place]     right side  . TONSILECTOMY, ADENOIDECTOMY, BILATERAL MYRINGOTOMY AND TUBES      Family History  Problem Relation Age of Onset  . Heart disease Mother   . Diabetes Mother   . Ulcerative colitis Mother   . Colon cancer Neg Hx   . Esophageal cancer Neg Hx   . Rectal cancer Neg Hx   . Stomach cancer Neg Hx     No Known Allergies  Current Outpatient Medications on File Prior to Visit  Medication Sig Dispense Refill  . aspirin 81 MG tablet Take 81 mg by mouth daily.      . Multiple Vitamin (MULTIVITAMIN) tablet Take 1 tablet by mouth daily.      . sildenafil (REVATIO) 20 MG tablet Take 1 to 2 tabs 2 - 3 hours before sex 30 tablet 11   No current facility-administered medications on file prior to visit.    BP 110/70 (BP Location: Left Arm, Patient Position: Sitting, Cuff Size: Normal)   Pulse 70   Temp 97.8 F (36.6 C) (Oral)   Ht '6\' 2"'  (1.88 m)   Wt 182 lb 6.4 oz (82.7 kg)   SpO2 96%   BMI 23.42 kg/m       Objective:   Physical Exam Vitals and nursing note reviewed.  Constitutional:      General: He is not in acute distress.    Appearance: Normal appearance. He is well-developed and normal weight.  HENT:     Head: Normocephalic and atraumatic.     Right Ear: Tympanic membrane, ear canal and external ear normal. There is no impacted cerumen.     Left Ear: Tympanic membrane, ear canal and external ear normal. There is no impacted cerumen.     Nose: Nose normal. No congestion or rhinorrhea.     Mouth/Throat:     Mouth: Mucous membranes are moist.     Pharynx: Oropharynx is clear. No oropharyngeal exudate or posterior oropharyngeal erythema.  Eyes:     General:        Right eye: No discharge.        Left eye: No discharge.     Extraocular Movements:  Extraocular movements intact.     Conjunctiva/sclera: Conjunctivae normal.     Pupils: Pupils are equal, round, and reactive to light.  Neck:     Vascular: No carotid bruit.     Trachea: No tracheal deviation.  Cardiovascular:     Rate and Rhythm: Normal rate and regular rhythm.     Pulses: Normal pulses.     Heart sounds: Normal heart sounds. No murmur heard.  No friction rub. No gallop.  Pulmonary:     Effort: Pulmonary effort is normal. No respiratory distress.     Breath sounds: Normal breath sounds. No stridor. No wheezing, rhonchi or rales.  Chest:     Chest wall: No tenderness.  Abdominal:     General: Bowel sounds are normal. There is no distension.     Palpations: Abdomen is soft. There is no mass.     Tenderness: There is no abdominal tenderness. There is no right CVA tenderness, left CVA tenderness, guarding or rebound.     Hernia: No hernia is present.  Musculoskeletal:        General: No swelling, tenderness, deformity or signs of injury. Normal range of motion.     Right lower leg: No edema.     Left lower leg: No edema.  Lymphadenopathy:     Cervical: No cervical adenopathy.  Skin:    General: Skin is warm and dry.     Capillary Refill: Capillary refill takes less than 2 seconds.     Coloration: Skin is not jaundiced or pale.     Findings: No bruising, erythema, lesion or rash.  Neurological:     General: No focal deficit present.     Mental Status: He is alert and oriented to person, place, and time.     Cranial Nerves: No cranial nerve deficit.     Sensory: No sensory deficit.     Motor: No weakness.     Coordination: Coordination normal.     Gait: Gait normal.     Deep Tendon Reflexes: Reflexes normal.  Psychiatric:        Mood and Affect: Mood normal.        Behavior: Behavior normal.        Thought Content: Thought content normal.        Judgment: Judgment normal.        Assessment & Plan:  1. Routine general medical examination at a health care  facility - Continue to stay active and eat healthy  - Follow up in one year or sooner if needed - CBC with Differential/Platelet; Future - Lipid panel; Future - TSH; Future - CMP with eGFR([facility]); Future  2. BPH associated with nocturia  - PSA; Future  3. HYPERTRIGLYCERIDEMIA  - CBC with Differential/Platelet; Future - Lipid panel; Future - TSH; Future - CMP with eGFR([facility]); Future - gemfibrozil (LOPID) 600 MG tablet; Take 1 tablet (600 mg total) by mouth 2 (two) times daily before a meal.  Dispense: 180 tablet; Refill: [place]

## 2020-02-08 NOTE — Patient Instructions (Addendum)
It was great seeing you today   You can try osteo-bi- flex for joint health and arthritic pain. CBD lotion is another option   We will follow up regarding your blood work

## 2020-02-09 LAB — LIPID PANEL
Cholesterol: 151 mg/dL (ref <200)
HDL: 42 mg/dL (ref >=40)
LDL Cholesterol (Calc): 88 mg/dL (calc)
Non-HDL Cholesterol (Calc): 109 mg/dL (calc) (ref <130)
Total CHOL/HDL Ratio: 3.6 (calc) (ref <5.0)
Triglycerides: 111 mg/dL (ref <150)

## 2020-02-09 LAB — CBC WITH DIFFERENTIAL/PLATELET
Absolute Monocytes: 495 cells/uL (ref 200–950)
Basophils Absolute: 49 cells/uL (ref 0–200)
Basophils Relative: 1 %
Eosinophils Absolute: 201 cells/uL (ref 15–500)
Eosinophils Relative: 4.1 %
HCT: 42.3 % (ref 38.5–50.0)
Hemoglobin: 14.7 g/dL (ref 13.2–17.1)
Lymphs Abs: 1495 cells/uL (ref 850–3900)
MCH: 30.8 pg (ref 27.0–33.0)
MCHC: 34.8 g/dL (ref 32.0–36.0)
MCV: 88.5 fL (ref 80.0–100.0)
MPV: 10.3 fL (ref 7.5–12.5)
Monocytes Relative: 10.1 %
Neutro Abs: 2661 cells/uL (ref 1500–7800)
Neutrophils Relative %: 54.3 %
Platelets: 230 10*3/uL (ref 140–400)
RBC: 4.78 10*6/uL (ref 4.20–5.80)
RDW: 12.4 % (ref 11.0–15.0)
Total Lymphocyte: 30.5 %
WBC: 4.9 10*3/uL (ref 3.8–10.8)

## 2020-02-09 LAB — COMPLETE METABOLIC PANEL WITH GFR
AG Ratio: 2.2 (calc) (ref 1.0–2.5)
ALT: 15 U/L (ref 9–46)
AST: 25 U/L (ref 10–35)
Albumin: 4.8 g/dL (ref 3.6–5.1)
Alkaline phosphatase (APISO): 100 U/L (ref 35–144)
BUN: 19 mg/dL (ref 7–25)
CO2: 25 mmol/L (ref 20–32)
Calcium: 9.7 mg/dL (ref 8.6–10.3)
Chloride: 104 mmol/L (ref 98–110)
Creat: 1.13 mg/dL (ref 0.70–1.18)
GFR, Est African American: 74 mL/min/{1.73_m2} (ref >=60)
GFR, Est Non African American: 64 mL/min/{1.73_m2} (ref >=60)
Globulin: 2.2 g/dL (calc) (ref 1.9–3.7)
Glucose, Bld: 76 mg/dL (ref 65–99)
Potassium: 4.5 mmol/L (ref 3.5–5.3)
Sodium: 141 mmol/L (ref 135–146)
Total Bilirubin: 0.8 mg/dL (ref 0.2–1.2)
Total Protein: 7 g/dL (ref 6.1–8.1)

## 2020-02-09 LAB — SPECIMEN COMPROMISED

## 2020-02-09 LAB — PSA: PSA: 1.8 ng/mL (ref <=4.0)

## 2020-02-09 LAB — TSH: TSH: 4.14 mIU/L (ref 0.40–4.50)

## 2020-02-24 DIAGNOSIS — R69 Illness, unspecified: Secondary | ICD-10-CM | POA: Diagnosis not present

## 2020-04-24 DIAGNOSIS — H25813 Combined forms of age-related cataract, bilateral: Secondary | ICD-10-CM | POA: Diagnosis not present

## 2020-04-24 DIAGNOSIS — H35371 Puckering of macula, right eye: Secondary | ICD-10-CM | POA: Diagnosis not present

## 2020-04-24 DIAGNOSIS — H43811 Vitreous degeneration, right eye: Secondary | ICD-10-CM | POA: Diagnosis not present

## 2020-05-08 DIAGNOSIS — H35351 Cystoid macular degeneration, right eye: Secondary | ICD-10-CM | POA: Diagnosis not present

## 2020-05-08 DIAGNOSIS — H40013 Open angle with borderline findings, low risk, bilateral: Secondary | ICD-10-CM | POA: Diagnosis not present

## 2020-05-08 DIAGNOSIS — H2513 Age-related nuclear cataract, bilateral: Secondary | ICD-10-CM | POA: Diagnosis not present

## 2020-05-08 DIAGNOSIS — H4423 Degenerative myopia, bilateral: Secondary | ICD-10-CM | POA: Diagnosis not present

## 2020-05-08 DIAGNOSIS — H33191 Other retinoschisis and retinal cysts, right eye: Secondary | ICD-10-CM | POA: Diagnosis not present

## 2020-05-08 DIAGNOSIS — H35371 Puckering of macula, right eye: Secondary | ICD-10-CM | POA: Diagnosis not present

## 2020-05-22 DIAGNOSIS — H4423 Degenerative myopia, bilateral: Secondary | ICD-10-CM | POA: Diagnosis not present

## 2020-05-22 DIAGNOSIS — H33191 Other retinoschisis and retinal cysts, right eye: Secondary | ICD-10-CM | POA: Diagnosis not present

## 2020-05-22 DIAGNOSIS — H2513 Age-related nuclear cataract, bilateral: Secondary | ICD-10-CM | POA: Diagnosis not present

## 2020-05-22 DIAGNOSIS — H40013 Open angle with borderline findings, low risk, bilateral: Secondary | ICD-10-CM | POA: Diagnosis not present

## 2020-05-22 DIAGNOSIS — H35351 Cystoid macular degeneration, right eye: Secondary | ICD-10-CM | POA: Diagnosis not present

## 2020-05-22 DIAGNOSIS — H35371 Puckering of macula, right eye: Secondary | ICD-10-CM | POA: Diagnosis not present

## 2020-06-26 ENCOUNTER — Encounter: Payer: Self-pay | Admitting: Adult Health

## 2020-06-27 NOTE — Telephone Encounter (Signed)
Information abstracted into the chart.  Nothing further needed.

## 2020-07-11 ENCOUNTER — Ambulatory Visit (INDEPENDENT_AMBULATORY_CARE_PROVIDER_SITE_OTHER): Payer: Medicare HMO

## 2020-07-11 DIAGNOSIS — Z0000 Encounter for general adult medical examination without abnormal findings: Secondary | ICD-10-CM

## 2020-07-11 NOTE — Patient Instructions (Signed)
Marcus Reynolds , Thank you for taking time to come for your Medicare Wellness Visit. I appreciate your ongoing commitment to your health goals. Please review the following plan we discussed and let me know if I can assist you in the future.   Screening recommendations/referrals: Colonoscopy: No longer required  Recommended yearly ophthalmology/optometry visit for glaucoma screening and checkup Recommended yearly dental visit for hygiene and checkup  Vaccinations: Influenza vaccine: Up to date, next due fall 2022  Pneumococcal vaccine: Completed series  Tdap vaccine: Up to date, next due 09/26/2024 Shingles vaccine: Completed series     Advanced directives: Please bring in copies of your medical directives so that we may scan them into your chart.   Conditions/risks identified: None   Next appointment: 02/08/2021 @ 10:00 am with Janna Arch for Physical   Preventive Care 3 Years and Older, Male Preventive care refers to lifestyle choices and visits with your health care provider that can promote health and wellness. What does preventive care include?  A yearly physical exam. This is also called an annual well check.  Dental exams once or twice a year.  Routine eye exams. Ask your health care provider how often you should have your eyes checked.  Personal lifestyle choices, including:  Daily care of your teeth and gums.  Regular physical activity.  Eating a healthy diet.  Avoiding tobacco and drug use.  Limiting alcohol use.  Practicing safe sex.  Taking low doses of aspirin every day.  Taking vitamin and mineral supplements as recommended by your health care provider. What happens during an annual well check? The services and screenings done by your health care provider during your annual well check will depend on your age, overall health, lifestyle risk factors, and family history of disease. Counseling  Your health care provider may ask you questions about  your:  Alcohol use.  Tobacco use.  Drug use.  Emotional well-being.  Home and relationship well-being.  Sexual activity.  Eating habits.  History of falls.  Memory and ability to understand (cognition).  Work and work Statistician. Screening  You may have the following tests or measurements:  Height, weight, and BMI.  Blood pressure.  Lipid and cholesterol levels. These may be checked every 5 years, or more frequently if you are over 25 years old.  Skin check.  Lung cancer screening. You may have this screening every year starting at age 30 if you have a 30-pack-year history of smoking and currently smoke or have quit within the past 15 years.  Fecal occult blood test (FOBT) of the stool. You may have this test every year starting at age 25.  Flexible sigmoidoscopy or colonoscopy. You may have a sigmoidoscopy every 5 years or a colonoscopy every 10 years starting at age 76.  Prostate cancer screening. Recommendations will vary depending on your family history and other risks.  Hepatitis C blood test.  Hepatitis B blood test.  Sexually transmitted disease (STD) testing.  Diabetes screening. This is done by checking your blood sugar (glucose) after you have not eaten for a while (fasting). You may have this done every 1-3 years.  Abdominal aortic aneurysm (AAA) screening. You may need this if you are a current or former smoker.  Osteoporosis. You may be screened starting at age 73 if you are at high risk. Talk with your health care provider about your test results, treatment options, and if necessary, the need for more tests. Vaccines  Your health care provider may recommend certain vaccines,  such as:  Influenza vaccine. This is recommended every year.  Tetanus, diphtheria, and acellular pertussis (Tdap, Td) vaccine. You may need a Td booster every 10 years.  Zoster vaccine. You may need this after age 63.  Pneumococcal 13-valent conjugate (PCV13) vaccine.  One dose is recommended after age 25.  Pneumococcal polysaccharide (PPSV23) vaccine. One dose is recommended after age 49. Talk to your health care provider about which screenings and vaccines you need and how often you need them. This information is not intended to replace advice given to you by your health care provider. Make sure you discuss any questions you have with your health care provider. Document Released: 06/23/2015 Document Revised: 02/14/2016 Document Reviewed: 03/28/2015 Elsevier Interactive Patient Education  2017 [place] Prevention in the Home Falls can cause injuries. They can happen to people of all ages. There are many things you can do to make your home safe and to help prevent falls. What can I do on the outside of my home?  Regularly fix the edges of walkways and driveways and fix any cracks.  Remove anything that might make you trip as you walk through a door, such as a raised step or threshold.  Trim any bushes or trees on the path to your home.  Use bright outdoor lighting.  Clear any walking paths of anything that might make someone trip, such as rocks or tools.  Regularly check to see if handrails are loose or broken. Make sure that both sides of any steps have handrails.  Any raised decks and porches should have guardrails on the edges.  Have any leaves, snow, or ice cleared regularly.  Use sand or salt on walking paths during winter.  Clean up any spills in your garage right away. This includes oil or grease spills. What can I do in the bathroom?  Use night lights.  Install grab bars by the toilet and in the tub and shower. Do not use towel bars as grab bars.  Use non-skid mats or decals in the tub or shower.  If you need to sit down in the shower, use a plastic, non-slip stool.  Keep the floor dry. Clean up any water that spills on the floor as soon as it happens.  Remove soap buildup in the tub or shower regularly.  Attach bath  mats securely with double-sided non-slip rug tape.  Do not have throw rugs and other things on the floor that can make you trip. What can I do in the bedroom?  Use night lights.  Make sure that you have a light by your bed that is easy to reach.  Do not use any sheets or blankets that are too big for your bed. They should not hang down onto the floor.  Have a firm chair that has side arms. You can use this for support while you get dressed.  Do not have throw rugs and other things on the floor that can make you trip. What can I do in the kitchen?  Clean up any spills right away.  Avoid walking on wet floors.  Keep items that you use a lot in easy-to-reach places.  If you need to reach something above you, use a strong step stool that has a grab bar.  Keep electrical cords out of the way.  Do not use floor polish or wax that makes floors slippery. If you must use wax, use non-skid floor wax.  Do not have throw rugs and other things on  the floor that can make you trip. What can I do with my stairs?  Do not leave any items on the stairs.  Make sure that there are handrails on both sides of the stairs and use them. Fix handrails that are broken or loose. Make sure that handrails are as long as the stairways.  Check any carpeting to make sure that it is firmly attached to the stairs. Fix any carpet that is loose or worn.  Avoid having throw rugs at the top or bottom of the stairs. If you do have throw rugs, attach them to the floor with carpet tape.  Make sure that you have a light switch at the top of the stairs and the bottom of the stairs. If you do not have them, ask someone to add them for you. What else can I do to help prevent falls?  Wear shoes that:  Do not have high heels.  Have rubber bottoms.  Are comfortable and fit you well.  Are closed at the toe. Do not wear sandals.  If you use a stepladder:  Make sure that it is fully opened. Do not climb a closed  stepladder.  Make sure that both sides of the stepladder are locked into place.  Ask someone to hold it for you, if possible.  Clearly mark and make sure that you can see:  Any grab bars or handrails.  First and last steps.  Where the edge of each step is.  Use tools that help you move around (mobility aids) if they are needed. These include:  Canes.  Walkers.  Scooters.  Crutches.  Turn on the lights when you go into a dark area. Replace any light bulbs as soon as they burn out.  Set up your furniture so you have a clear path. Avoid moving your furniture around.  If any of your floors are uneven, fix them.  If there are any pets around you, be aware of where they are.  Review your medicines with your doctor. Some medicines can make you feel dizzy. This can increase your chance of falling. Ask your doctor what other things that you can do to help prevent falls. This information is not intended to replace advice given to you by your health care provider. Make sure you discuss any questions you have with your health care provider. Document Released: 03/23/2009 Document Revised: 11/02/2015 Document Reviewed: 07/01/2014 Elsevier Interactive Patient Education  2017 [employer].

## 2020-07-11 NOTE — Progress Notes (Signed)
Subjective:   Marcus Reynolds is a 75 y.o. male who presents for Medicare Annual/Subsequent preventive examination.  I connected with Marcus Reynolds  today by telephone and verified that I am speaking with the correct person using two identifiers. Location patient: home Location provider: work Persons participating in the virtual visit: patient, provider.   I discussed the limitations, risks, security and privacy concerns of performing an evaluation and management service by telephone and the availability of in person appointments. I also discussed with the patient that there may be a patient responsible charge related to this service. The patient expressed understanding and verbally consented to this telephonic visit.    Interactive audio and video telecommunications were attempted between this provider and patient, however failed, due to patient having technical difficulties OR patient did not have access to video capability.  We continued and completed visit with audio only.      Review of Systems    N/A  Cardiac Risk Factors include: advanced age (>65men, >47 women);dyslipidemia;male gender     Objective:    Today's Vitals   07/11/20 0817  PainSc: 2    There is no height or weight on file to calculate BMI.  Advanced Directives 07/11/2020 07/09/2019 12/02/2017 06/26/2017 12/07/2014  Does Patient Have a Medical Advance Directive? Yes Yes No Yes Yes  Type of Estate agent of [place];Living will Healthcare Power of [place];Living will - Healthcare Power of [place];Living will Healthcare Power of [place];Living will  Does patient want to make changes to medical advance directive? No - Patient declined No - Patient declined - No - Patient declined -  Copy of Healthcare Power of Attorney in Chart? No - copy requested No - copy requested - - -  Would patient like information on creating a medical advance directive? - - No - Patient declined - -    Current Medications  (verified) Outpatient Encounter Medications as of 07/11/2020  Medication Sig  . aspirin 81 MG tablet Take 81 mg by mouth daily.  . dorzolamide (TRUSOPT) 2 % ophthalmic solution Place 1 drop into the right eye 2 (two) times daily.  Marland Kitchen gemfibrozil (LOPID) 600 MG tablet Take 1 tablet (600 mg total) by mouth 2 (two) times daily before a meal.  . ketorolac (ACULAR) 0.5 % ophthalmic solution Place 1 drop into the right eye 3 (three) times daily.  . Multiple Vitamin (MULTIVITAMIN) tablet Take 1 tablet by mouth daily.  . sildenafil (REVATIO) 20 MG tablet Take 1 to 2 tabs 2 - 3 hours before sex   No facility-administered encounter medications on file as of 07/11/2020.    Allergies (verified) Patient has no known allergies.   History: Past Medical History:  Diagnosis Date  . Hyperlipidemia    high tri  . Personal history of colonic polyps 12/25/2010   Past Surgical History:  Procedure Laterality Date  . COLONOSCOPY  12/25/2010   internal hemorrhoids  . COLONOSCOPY W/ POLYPECTOMY  2007   [place], [place]  . INGUINAL HERNIA REPAIR     right side  . TONSILECTOMY, ADENOIDECTOMY, BILATERAL MYRINGOTOMY AND TUBES     Family History  Problem Relation Age of Onset  . Heart disease Mother   . Diabetes Mother   . Ulcerative colitis Mother   . Colon cancer Neg Hx   . Esophageal cancer Neg Hx   . Rectal cancer Neg Hx   . Stomach cancer Neg Hx    Social History   Socioeconomic History  . Marital status: Married  Spouse name: Not on file  . Number of children: 2  . Years of education: Not on file  . Highest education level: Professional school degree (e.g., MD, DDS, DVM, JD)  Occupational History  . Occupation: Retired  Tobacco Use  . Smoking status: Never Smoker  . Smokeless tobacco: Never Used  Vaping Use  . Vaping Use: Never used  Substance and Sexual Activity  . Alcohol use: Yes    Alcohol/week: 1.0 standard drink    Types: 1 Glasses of wine per week    Comment: socially  . Drug  use: No  . Sexual activity: Not on file  Other Topics Concern  . Not on file  Social History Narrative   Retired   [employer] 2   Married    Volunteers at [employer]: [place]   . Difficulty of Paying Living Expenses: Not hard at all  Food Insecurity: No Food Insecurity  . Worried About Charity fundraiser in the Last Year: Never true  . Ran Out of Food in the Last Year: Never true  Transportation Needs: No Transportation Needs  . Lack of Transportation (Medical): No  . Lack of Transportation (Non-Medical): No  Physical Activity: Sufficiently Active  . Days of Exercise per Week: 5 days  . Minutes of Exercise per Session: 40 min  Stress: No Stress Concern Present  . Feeling of Stress : Not at all  Social Connections: Socially Integrated  . Frequency of Communication with Friends and Family: More than three times a week  . Frequency of Social Gatherings with Friends and Family: More than three times a week  . Attends Religious Services: 1 to 4 times per year  . Active Member of Clubs or Organizations: Yes  . Attends Archivist Meetings: More than 4 times per year  . Marital Status: Married    Tobacco Counseling Counseling given: Not Answered   Clinical Intake:  Pre-visit preparation completed: Yes  Pain : 0-10 Pain Score: 2  Pain Type: Chronic pain Pain Location:  (Joint) Pain Descriptors / Indicators: Aching Pain Onset: More than a month ago Pain Frequency: Intermittent Pain Relieving Factors: Exercises, ibuprofen  Pain Relieving Factors: Exercises, ibuprofen  Nutritional Risks: None Diabetes: No  How often do you need to have someone help you when you read instructions, pamphlets, or other written materials from your doctor or pharmacy?: 1 - Never What is the last grade level you completed in school?: Graduate Degree  Diabetic?No   Interpreter Needed?: No  Information entered by ::  [place] of Daily Living In your present state of health, do you have any difficulty performing the following activities: 07/11/2020  Hearing? 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 N  Difficulty concentrating or making decisions? N  Walking or climbing stairs? N  Dressing or bathing? N  Doing errands, shopping? N  Preparing Food and eating ? N  Using the Toilet? N  In the past six months, have you accidently leaked urine? N  Do you have problems with loss of bowel control? N  Managing your Medications? N  Managing your Finances? N  Housekeeping or managing your Housekeeping? N  Some recent data might be hidden    Patient Care Team: Dorothyann Peng, NP as PCP - General (Family Medicine)  Indicate any recent Medical Services you may have received from other than [facility] providers in the past  year (date may be approximate).     Assessment:   This is a routine wellness examination for Marcus Reynolds.  Hearing/Vision screen  Hearing Screening   125Hz  250Hz  500Hz  1000Hz  2000Hz  3000Hz  4000Hz  6000Hz  8000Hz   Right ear:           Left ear:           Vision Screening Comments: Patient states gets eyes examined twice per year   Dietary issues and exercise activities discussed: Current Exercise Habits: Home exercise routine, Type of exercise: walking, Time (Minutes): 35, Frequency (Times/Week): 5, Weekly Exercise (Minutes/Week): 175, Intensity: Moderate  Goals    . DIET - INCREASE WATER INTAKE    . Patient Stated     Visit daughter in [place] once pandemic settles     . Patient Stated     I would like to travel internationally       Depression Screen PHQ 2/9 Scores 07/11/2020 07/09/2019 10/30/2017 10/23/2016 10/11/2015 09/27/2014 09/23/2013  PHQ - 2 Score 0 0 0 0 0 0 0    Fall Risk Fall Risk  07/11/2020 07/09/2019 10/30/2017 10/23/2016 10/11/2015  Falls in the past year? 0 0 No No No  Number falls in past yr: 0 - - - -  Injury with Fall? 0 - - - -  Risk for fall due to : No  Fall Risks Medication side effect;Orthopedic patient - - -  Follow up Falls evaluation completed;Falls prevention discussed Falls evaluation completed;Education provided;Falls prevention discussed - - -    FALL RISK PREVENTION PERTAINING TO THE HOME:  Any stairs in or around the home? Yes  If so, are there any without handrails? No  Home free of loose throw rugs in walkways, pet beds, electrical cords, etc? Yes  Adequate lighting in your home to reduce risk of falls? Yes   ASSISTIVE DEVICES UTILIZED TO PREVENT FALLS:  Life alert? No  Use of a cane, walker or w/c? No  Grab bars in the bathroom? No  Shower chair or bench in shower? Yes  Elevated toilet seat or a handicapped toilet? No    Cognitive Function: Normal cognitive status assessed by direct observation by this Nurse Health Advisor. No abnormalities found.       6CIT Screen 07/09/2019  What Year? 0 points  What month? 0 points  What time? 0 points  Count back from 20 0 points  Months in reverse 0 points  Repeat phrase 0 points  Total Score 0    Immunizations Immunization History  Administered Date(s) Administered  . Fluad Quad(high Dose 65+) 02/04/2019  . Influenza Split 03/26/2011, 03/27/2012  . Influenza Whole 06/13/2009  . Influenza, High Dose Seasonal PF 03/14/2015, 02/29/2016, 03/13/2017, 03/13/2018  . Influenza,inj,Quad PF,6+ Mos 03/24/2013, 03/22/2014  . Influenza-Unspecified 02/24/2020  . PFIZER(Purple Top)SARS-COV-2 Vaccination 06/30/2019, 07/21/2019, 03/24/2020  . Pneumococcal Conjugate-13 09/23/2013  . Pneumococcal Polysaccharide-23 07/09/2010  . Td 09/27/2014  . Zoster 06/10/2006  . Zoster Recombinat (Shingrix) 02/13/2019, 06/15/2019    TDAP status: Up to date  Flu Vaccine status: Up to date  Pneumococcal vaccine status: Up to date  Covid-19 vaccine status: Completed vaccines  Qualifies for Shingles Vaccine? Yes   Zostavax completed Yes   Shingrix Completed?: Yes  Screening  Tests Health Maintenance  Topic Date Due  . TETANUS/TDAP  09/26/2024  . COLONOSCOPY (Pts 45-66yrs Insurance coverage will need to be confirmed)  01/16/2025  . INFLUENZA VACCINE  Completed  . COVID-19 Vaccine  Completed  . Hepatitis C Screening  Completed  .  PNA vac Low Risk Adult  Completed    Health Maintenance  There are no preventive care reminders to display for this patient.  Colorectal cancer screening: No longer required.   Lung Cancer Screening: (Low Dose CT Chest recommended if Age 25-80 years, 30 pack-year currently smoking OR have quit w/in 15years.) does not qualify.   Lung Cancer Screening Referral: N/A   Additional Screening:  Hepatitis C Screening: does qualify; Completed 02/04/2019  Vision Screening: Recommended annual ophthalmology exams for early detection of glaucoma and other disorders of the eye. Is the patient up to date with their annual eye exam?  Yes  Who is the provider or what is the name of the office in which the patient attends annual eye exams? Dr. Eulas Post  If pt is not established with a provider, would they like to be referred to a provider to establish care? No .   Dental Screening: Recommended annual dental exams for proper oral hygiene  Community Resource Referral / Chronic Care Management: CRR required this visit?  No   CCM required this visit?  No      Plan:     I have personally reviewed and noted the following in the patient's chart:   . Medical and social history . Use of alcohol, tobacco or illicit drugs  . Current medications and supplements . Functional ability and status . Nutritional status . Physical activity . Advanced directives . List of other physicians . Hospitalizations, surgeries, and ER visits in previous 12 months . Vitals . Screenings to include cognitive, depression, and falls . Referrals and appointments  In addition, I have reviewed and discussed with patient certain preventive protocols, quality  metrics, and best practice recommendations. A written personalized care plan for preventive services as well as general preventive health recommendations were provided to patient.     Ofilia Neas, LPN   09/15/2705   Nurse Notes: None

## 2020-07-19 DIAGNOSIS — H35371 Puckering of macula, right eye: Secondary | ICD-10-CM | POA: Diagnosis not present

## 2020-07-19 DIAGNOSIS — H2513 Age-related nuclear cataract, bilateral: Secondary | ICD-10-CM | POA: Diagnosis not present

## 2020-07-19 DIAGNOSIS — H40013 Open angle with borderline findings, low risk, bilateral: Secondary | ICD-10-CM | POA: Diagnosis not present

## 2020-07-19 DIAGNOSIS — H35351 Cystoid macular degeneration, right eye: Secondary | ICD-10-CM | POA: Diagnosis not present

## 2020-08-02 DIAGNOSIS — H35351 Cystoid macular degeneration, right eye: Secondary | ICD-10-CM | POA: Diagnosis not present

## 2020-08-03 DIAGNOSIS — H05221 Edema of right orbit: Secondary | ICD-10-CM | POA: Diagnosis not present

## 2020-08-03 DIAGNOSIS — H35351 Cystoid macular degeneration, right eye: Secondary | ICD-10-CM | POA: Diagnosis not present

## 2020-08-03 DIAGNOSIS — H11821 Conjunctivochalasis, right eye: Secondary | ICD-10-CM | POA: Diagnosis not present

## 2020-08-03 DIAGNOSIS — H02401 Unspecified ptosis of right eyelid: Secondary | ICD-10-CM | POA: Diagnosis not present

## 2020-08-03 DIAGNOSIS — H02841 Edema of right upper eyelid: Secondary | ICD-10-CM | POA: Diagnosis not present

## 2020-08-23 DIAGNOSIS — H30021 Focal chorioretinal inflammation of posterior pole, right eye: Secondary | ICD-10-CM | POA: Diagnosis not present

## 2020-08-23 DIAGNOSIS — H35351 Cystoid macular degeneration, right eye: Secondary | ICD-10-CM | POA: Diagnosis not present

## 2020-08-23 DIAGNOSIS — H4423 Degenerative myopia, bilateral: Secondary | ICD-10-CM | POA: Diagnosis not present

## 2020-08-23 DIAGNOSIS — H35371 Puckering of macula, right eye: Secondary | ICD-10-CM | POA: Diagnosis not present

## 2020-08-23 DIAGNOSIS — H25813 Combined forms of age-related cataract, bilateral: Secondary | ICD-10-CM | POA: Diagnosis not present

## 2020-09-13 DIAGNOSIS — H4423 Degenerative myopia, bilateral: Secondary | ICD-10-CM | POA: Diagnosis not present

## 2020-09-13 DIAGNOSIS — H30111 Disseminated chorioretinal inflammation of posterior pole, right eye: Secondary | ICD-10-CM | POA: Diagnosis not present

## 2020-09-13 DIAGNOSIS — H30021 Focal chorioretinal inflammation of posterior pole, right eye: Secondary | ICD-10-CM | POA: Diagnosis not present

## 2020-09-13 DIAGNOSIS — H35351 Cystoid macular degeneration, right eye: Secondary | ICD-10-CM | POA: Diagnosis not present

## 2020-09-13 DIAGNOSIS — H35371 Puckering of macula, right eye: Secondary | ICD-10-CM | POA: Diagnosis not present

## 2020-09-13 DIAGNOSIS — H30011 Focal chorioretinal inflammation, juxtapapillary, right eye: Secondary | ICD-10-CM | POA: Diagnosis not present

## 2020-10-04 DIAGNOSIS — H30021 Focal chorioretinal inflammation of posterior pole, right eye: Secondary | ICD-10-CM | POA: Diagnosis not present

## 2020-11-01 DIAGNOSIS — H35371 Puckering of macula, right eye: Secondary | ICD-10-CM | POA: Diagnosis not present

## 2020-11-01 DIAGNOSIS — H25813 Combined forms of age-related cataract, bilateral: Secondary | ICD-10-CM | POA: Diagnosis not present

## 2020-11-01 DIAGNOSIS — H30021 Focal chorioretinal inflammation of posterior pole, right eye: Secondary | ICD-10-CM | POA: Diagnosis not present

## 2020-11-01 DIAGNOSIS — H35351 Cystoid macular degeneration, right eye: Secondary | ICD-10-CM | POA: Diagnosis not present

## 2020-11-01 DIAGNOSIS — H35341 Macular cyst, hole, or pseudohole, right eye: Secondary | ICD-10-CM | POA: Diagnosis not present

## 2020-12-27 DIAGNOSIS — H43822 Vitreomacular adhesion, left eye: Secondary | ICD-10-CM | POA: Diagnosis not present

## 2020-12-27 DIAGNOSIS — H35341 Macular cyst, hole, or pseudohole, right eye: Secondary | ICD-10-CM | POA: Diagnosis not present

## 2020-12-27 DIAGNOSIS — H35371 Puckering of macula, right eye: Secondary | ICD-10-CM | POA: Diagnosis not present

## 2020-12-27 DIAGNOSIS — H25813 Combined forms of age-related cataract, bilateral: Secondary | ICD-10-CM | POA: Diagnosis not present

## 2020-12-27 DIAGNOSIS — H30021 Focal chorioretinal inflammation of posterior pole, right eye: Secondary | ICD-10-CM | POA: Diagnosis not present

## 2020-12-27 DIAGNOSIS — H35351 Cystoid macular degeneration, right eye: Secondary | ICD-10-CM | POA: Diagnosis not present

## 2020-12-30 ENCOUNTER — Ambulatory Visit (HOSPITAL_COMMUNITY)
Admission: EM | Admit: 2020-12-30 | Discharge: 2020-12-30 | Disposition: A | Discharge status: Home / Self Care | Payer: Medicare HMO

## 2020-12-30 ENCOUNTER — Encounter (HOSPITAL_COMMUNITY): Payer: Self-pay | Admitting: Emergency Medicine

## 2020-12-30 ENCOUNTER — Other Ambulatory Visit: Payer: Self-pay

## 2020-12-30 DIAGNOSIS — S0990XA Unspecified injury of head, initial encounter: Secondary | ICD-10-CM

## 2020-12-30 DIAGNOSIS — S0101XA Laceration without foreign body of scalp, initial encounter: Secondary | ICD-10-CM

## 2020-12-30 NOTE — ED Triage Notes (Signed)
Pt presents with head laceration. States hit head on metal box in garage approx 30 minutes ago. States did not LOC.

## 2020-12-30 NOTE — Discharge Instructions (Addendum)
Clean the wound twice a day with warm soapy water.  You can put antibiotic ointment on it as needed.    If you notice any redness, swelling, warmth, or discharge/drainage, come back for re-evaluation.    Return or go to the Emergency Department if symptoms worsen or do not improve in the next few days.

## 2020-12-30 NOTE — ED Provider Notes (Signed)
[facility]    CSN: [id number] Arrival date & time: 12/30/20  1658      History   Chief Complaint Chief Complaint  Patient presents with   Head Laceration    HPI Marcus Reynolds is a 75 y.o. male.   Patient here for evaluation of laceration to top of scalp that occurred earlier today.  Reports hitting head on box in garage.  Denies any blood thinner, other than daily baby asa.  Denies any dizziness, blurred vision, or loss of consciousness.  Bleeding controlled.  Denies any specific alleviating or aggravating factors.  Denies any fevers, chest pain, shortness of breath, N/V/D, numbness, tingling, weakness, abdominal pain, or headaches.     The history is provided by the patient.  Head Laceration   Past Medical History:  Diagnosis Date   Hyperlipidemia    high tri   Personal history of colonic polyps 12/25/2010    Patient Active Problem List   Diagnosis Date Noted   Osteoarthritis of spine with radiculopathy, lumbar region 08/04/2017   Routine general medical examination at a health care facility 09/23/2013   ED (erectile dysfunction) 07/23/2011   History of colonic polyps 12/25/2010   HYPERTRIGLYCERIDEMIA 06/13/2009    Past Surgical History:  Procedure Laterality Date   COLONOSCOPY  12/25/2010   internal hemorrhoids   COLONOSCOPY W/ POLYPECTOMY  2007   [place], [place]   INGUINAL HERNIA REPAIR     right side   TONSILECTOMY, ADENOIDECTOMY, BILATERAL MYRINGOTOMY AND TUBES         Home Medications    Prior to Admission medications   Medication Sig Start Date End Date Taking? Authorizing Provider  aspirin 81 MG tablet Take 81 mg by mouth daily.    [provider]  dorzolamide (TRUSOPT) 2 % ophthalmic solution Place 1 drop into the right eye 2 (two) times daily. 05/09/20   [provider]  gemfibrozil (LOPID) 600 MG tablet Take 1 tablet (600 mg total) by mouth 2 (two) times daily before a meal. 02/08/20   Nafziger, Tommi Rumps, NP  ketorolac  (ACULAR) 0.5 % ophthalmic solution Place 1 drop into the right eye 3 (three) times daily. 05/09/20   [provider]  Multiple Vitamin (MULTIVITAMIN) tablet Take 1 tablet by mouth daily.    [provider]  sildenafil (REVATIO) 20 MG tablet Take 1 to 2 tabs 2 - 3 hours before sex 11/06/17   Dorena Cookey, MD    Family History Family History  Problem Relation Age of Onset   Heart disease Mother    Diabetes Mother    Ulcerative colitis Mother    Colon cancer Neg Hx    Esophageal cancer Neg Hx    Rectal cancer Neg Hx    Stomach cancer Neg Hx     Social History Social History   Tobacco Use   Smoking status: Never   Smokeless tobacco: Never  Vaping Use   Vaping Use: Never used  Substance Use Topics   Alcohol use: Yes    Alcohol/week: 1.0 standard drink    Types: 1 Glasses of wine per week    Comment: socially   Drug use: No     Allergies   Patient has no known allergies.   Review of Systems Review of Systems  Skin:  Positive for wound.  All other systems reviewed and are negative.   Physical Exam Triage Vital Signs ED Triage Vitals  Enc Vitals Group     BP 12/30/20 1708 (!) 146/92  Pulse Rate 12/30/20 1708 80     Resp 12/30/20 1708 16     Temp 12/30/20 1708 98.6 F (37 C)     Temp Source 12/30/20 1708 Oral     SpO2 12/30/20 1708 96 %     Weight --      Height --      Head Circumference --      Peak Flow --      Pain Score 12/30/20 1704 3     Pain Loc --      Pain Edu? --      Excl. in [place]? --    No data found.  Updated Vital Signs BP (!) 146/92 (BP Location: Right Arm)   Pulse 80   Temp 98.6 F (37 C) (Oral)   Resp 16   SpO2 96%   Visual Acuity Right Eye Distance:   Left Eye Distance:   Bilateral Distance:    Right Eye Near:   Left Eye Near:    Bilateral Near:     Physical Exam Vitals and nursing note reviewed.  Constitutional:      General: He is not in acute distress.    Appearance: Normal appearance. He is not  ill-appearing, toxic-appearing or diaphoretic.  HENT:     Head: Normocephalic and atraumatic.  Eyes:     Conjunctiva/sclera: Conjunctivae normal.  Cardiovascular:     Rate and Rhythm: Normal rate.     Pulses: Normal pulses.  Pulmonary:     Effort: Pulmonary effort is normal.  Abdominal:     General: Abdomen is flat.  Musculoskeletal:        General: Normal range of motion.     Cervical back: Normal range of motion.  Skin:    General: Skin is warm and dry.     Findings: Laceration (small 0.5cm laceration to top of scalp, bleeding controlled) present.  Neurological:     General: No focal deficit present.     Mental Status: He is alert and oriented to person, place, and time.  Psychiatric:        Mood and Affect: Mood normal.     UC Treatments / Results  Labs (all labs ordered are listed, but only abnormal results are displayed) Labs Reviewed - No data to display  EKG   Radiology No results found.  Procedures Procedures (including critical care time)  Medications Ordered in UC Medications - No data to display  Initial Impression / Assessment and Plan / UC Course  I have reviewed the triage vital signs and the nursing notes.  Pertinent labs & imaging results that were available during my care of the patient were reviewed by me and considered in my medical decision making (see chart for details).    Assessment negative for red flags or concerns.  Sutures and staples not necessary.  Offered to apply dermabond for closure but patient declined.  Wound cleaned.  Recommend washing wound twice a day with soapy water and may apply antibiotic ointment as needed.  Follow up with primary care as needed.   Final Clinical Impressions(s) / UC Diagnoses   Final diagnoses:  Laceration of scalp without foreign body, initial encounter  Minor head injury, initial encounter     Discharge Instructions      Clean the wound twice a day with warm soapy water.  You can put antibiotic  ointment on it as needed.    If you notice any redness, swelling, warmth, or discharge/drainage, come back for re-evaluation.  Return or go to the Emergency Department if symptoms worsen or do not improve in the next few days.      ED Prescriptions   None    PDMP not reviewed this encounter.   Pearson Forster, NP 12/30/20 [phone]

## 2021-02-02 DIAGNOSIS — Z85828 Personal history of other malignant neoplasm of skin: Secondary | ICD-10-CM | POA: Diagnosis not present

## 2021-02-02 DIAGNOSIS — C44519 Basal cell carcinoma of skin of other part of trunk: Secondary | ICD-10-CM | POA: Diagnosis not present

## 2021-02-02 DIAGNOSIS — C44712 Basal cell carcinoma of skin of right lower limb, including hip: Secondary | ICD-10-CM | POA: Diagnosis not present

## 2021-02-02 DIAGNOSIS — L821 Other seborrheic keratosis: Secondary | ICD-10-CM | POA: Diagnosis not present

## 2021-02-02 DIAGNOSIS — L814 Other melanin hyperpigmentation: Secondary | ICD-10-CM | POA: Diagnosis not present

## 2021-02-02 DIAGNOSIS — D1801 Hemangioma of skin and subcutaneous tissue: Secondary | ICD-10-CM | POA: Diagnosis not present

## 2021-02-02 DIAGNOSIS — D225 Melanocytic nevi of trunk: Secondary | ICD-10-CM | POA: Diagnosis not present

## 2021-02-02 DIAGNOSIS — C44719 Basal cell carcinoma of skin of left lower limb, including hip: Secondary | ICD-10-CM | POA: Diagnosis not present

## 2021-02-02 DIAGNOSIS — L570 Actinic keratosis: Secondary | ICD-10-CM | POA: Diagnosis not present

## 2021-02-07 ENCOUNTER — Other Ambulatory Visit: Payer: Self-pay

## 2021-02-08 ENCOUNTER — Ambulatory Visit (INDEPENDENT_AMBULATORY_CARE_PROVIDER_SITE_OTHER): Payer: Medicare HMO | Admitting: Adult Health

## 2021-02-08 VITALS — BP 128/76 | HR 71 | Temp 97.9°F | Ht 74.0 in | Wt 183.6 lb

## 2021-02-08 DIAGNOSIS — E781 Pure hyperglyceridemia: Secondary | ICD-10-CM | POA: Diagnosis not present

## 2021-02-08 DIAGNOSIS — R69 Illness, unspecified: Secondary | ICD-10-CM | POA: Diagnosis not present

## 2021-02-08 DIAGNOSIS — Z23 Encounter for immunization: Secondary | ICD-10-CM | POA: Diagnosis not present

## 2021-02-08 DIAGNOSIS — Z0000 Encounter for general adult medical examination without abnormal findings: Secondary | ICD-10-CM | POA: Diagnosis not present

## 2021-02-08 DIAGNOSIS — N401 Enlarged prostate with lower urinary tract symptoms: Secondary | ICD-10-CM | POA: Diagnosis not present

## 2021-02-08 DIAGNOSIS — R351 Nocturia: Secondary | ICD-10-CM | POA: Diagnosis not present

## 2021-02-08 DIAGNOSIS — F418 Other specified anxiety disorders: Secondary | ICD-10-CM | POA: Diagnosis not present

## 2021-02-08 LAB — LIPID PANEL
Cholesterol: 153 mg/dL (ref 0–200)
HDL: 40.3 mg/dL (ref >39.00)
LDL Cholesterol: 88 mg/dL (ref 0–99)
NonHDL: 112.32
Total CHOL/HDL Ratio: 4
Triglycerides: 120 mg/dL (ref 0.0–149.0)
VLDL: 24 mg/dL (ref 0.0–40.0)

## 2021-02-08 LAB — CBC WITH DIFFERENTIAL/PLATELET
Basophils Absolute: 0.1 10*3/uL (ref 0.0–0.1)
Basophils Relative: 1.3 % (ref 0.0–3.0)
Eosinophils Absolute: 0.2 10*3/uL (ref 0.0–0.7)
Eosinophils Relative: 3.5 % (ref 0.0–5.0)
HCT: 43.2 % (ref 39.0–52.0)
Hemoglobin: 14.5 g/dL (ref 13.0–17.0)
Lymphocytes Relative: 30.1 % (ref 12.0–46.0)
Lymphs Abs: 1.6 10*3/uL (ref 0.7–4.0)
MCHC: 33.6 g/dL (ref 30.0–36.0)
MCV: 90.3 fl (ref 78.0–100.0)
Monocytes Absolute: 0.5 10*3/uL (ref 0.1–1.0)
Monocytes Relative: 10.2 % (ref 3.0–12.0)
Neutro Abs: 2.9 10*3/uL (ref 1.4–7.7)
Neutrophils Relative %: 54.9 % (ref 43.0–77.0)
Platelets: 209 10*3/uL (ref 150.0–400.0)
RBC: 4.78 Mil/uL (ref 4.22–5.81)
RDW: 12.9 % (ref 11.5–15.5)
WBC: 5.2 10*3/uL (ref 4.0–10.5)

## 2021-02-08 LAB — COMPREHENSIVE METABOLIC PANEL
ALT: 13 U/L (ref 0–53)
AST: 22 U/L (ref 0–37)
Albumin: 4.6 g/dL (ref 3.5–5.2)
Alkaline Phosphatase: 113 U/L (ref 39–117)
BUN: 18 mg/dL (ref 6–23)
CO2: 29 mEq/L (ref 19–32)
Calcium: 9.9 mg/dL (ref 8.4–10.5)
Chloride: 101 mEq/L (ref 96–112)
Creatinine, Ser: 1.15 mg/dL (ref 0.40–1.50)
GFR: 62.22 mL/min (ref >60.00)
Glucose, Bld: 88 mg/dL (ref 70–99)
Potassium: 4.6 mEq/L (ref 3.5–5.1)
Sodium: 139 mEq/L (ref 135–145)
Total Bilirubin: 0.8 mg/dL (ref 0.2–1.2)
Total Protein: 6.9 g/dL (ref 6.0–8.3)

## 2021-02-08 LAB — PSA: PSA: 2.32 ng/mL (ref 0.10–4.00)

## 2021-02-08 LAB — TSH: TSH: 2.74 u[IU]/mL (ref 0.35–5.50)

## 2021-02-08 MED ORDER — CLONAZEPAM 0.5 MG PO TABS: 0.2500 mg | ORAL_TABLET | Freq: Every evening | ORAL | 0 refills | Status: DC | PRN

## 2021-02-08 MED ORDER — GEMFIBROZIL 600 MG PO TABS: 600.0000 mg | ORAL_TABLET | Freq: Two times a day (BID) | ORAL | 3 refills | Status: DC

## 2021-02-08 NOTE — Progress Notes (Signed)
Subjective:    Patient ID: Marcus Reynolds, male    DOB: 08/05/1945, 75 y.o.   MRN: [id number]  HPI Patient presents for yearly preventative medicine examination. He is a pleasant 75 year old male who  has a past medical history of Hyperlipidemia and Personal history of colonic polyps (12/25/2010).  Hypertriglyceridemia -  prescribed Lopid 600 mg twice daily and aspirin 81 mg daily.  He denies myalgia or fatigue Lab Results  Component Value Date   CHOL 151 02/08/2020   HDL 42 02/08/2020   LDLCALC 88 02/08/2020   TRIG 111 02/08/2020   CHOLHDL 3.6 02/08/2020   BPH - asymptomatic   Insomnia/Anxiety -this is his biggest complaint today.  He reports that on nights prior to having to do something such as travel by plane or car he will become anxious and will not be able to sleep.  He also has this issue when he is staying in a new place while traveling.  Happens infrequently but he is tried melatonin, Unisom, Benadryl without any relief.  He is wondering if there is anything else that he can take.  All immunizations and health maintenance protocols were reviewed with the patient and needed orders were placed.  Appropriate screening laboratory values were ordered for the patient including screening of hyperlipidemia, renal function and hepatic function. If indicated by BPH, a PSA was ordered.  Medication reconciliation,  past medical history, social history, problem list and allergies were reviewed in detail with the patient  Goals were established with regard to weight loss, exercise, and  diet in compliance with medications. He stays active and eats healthy   Wt Readings from Last 3 Encounters:  02/08/21 183 lb 9.6 oz (83.3 kg)  02/08/20 182 lb 6.4 oz (82.7 kg)  01/17/20 185 lb (83.9 kg)   He is up to date on routine colon cancer screening and has been seen by dermatology.   Review of Systems  Constitutional: Negative.   HENT: Negative.    Eyes: Negative.   Respiratory: Negative.     Cardiovascular: Negative.   Gastrointestinal: Negative.   Endocrine: Negative.   Genitourinary: Negative.   Musculoskeletal: Negative.   Skin: Negative.   Allergic/Immunologic: Negative.   Neurological: Negative.   Hematological: Negative.   Psychiatric/Behavioral:  Positive for sleep disturbance. The patient is nervous/anxious.   All other systems reviewed and are negative.  Past Medical History:  Diagnosis Date   Hyperlipidemia    high tri   Personal history of colonic polyps 12/25/2010    Social History   Socioeconomic History   Marital status: Married    Spouse name: Not on file   Number of children: 2   Years of education: Not on file   Highest education level: Professional school degree (e.g., MD, DDS, DVM, JD)  Occupational History   Occupation: Retired  Tobacco Use   Smoking status: Never   Smokeless tobacco: Never  Vaping Use   Vaping Use: Never used  Substance and Sexual Activity   Alcohol use: Yes    Alcohol/week: 1.0 standard drink    Types: 1 Glasses of wine per week    Comment: socially   Drug use: No   Sexual activity: Not on file  Other Topics Concern   Not on file  Social History Narrative   Retired   [employer] 2   Married    Volunteers at [employer]: [place]  Difficulty of Paying Living Expenses: Not hard at all  Food Insecurity: No Food Insecurity   Worried About [place] in the Last Year: Never true   Ran Out of Food in the Last Year: Never true  Transportation Needs: No Transportation Needs   Lack of Transportation (Medical): No   Lack of Transportation (Non-Medical): No  Physical Activity: Sufficiently Active   Days of Exercise per Week: 5 days   Minutes of Exercise per Session: 40 min  Stress: No Stress Concern Present   Feeling of Stress : Not at all  Social Connections: Socially Integrated   Frequency of Communication with Friends and Family: More  than three times a week   Frequency of Social Gatherings with Friends and Family: More than three times a week   Attends Religious Services: 1 to 4 times per year   Active Member of [employer] or Organizations: Yes   Attends Music therapist: More than 4 times per year   Marital Status: Married  Human resources officer Violence: Not At Risk   Fear of Current or Ex-Partner: No   Emotionally Abused: No   Physically Abused: No   Sexually Abused: No    Past Surgical History:  Procedure Laterality Date   COLONOSCOPY  12/25/2010   internal hemorrhoids   COLONOSCOPY W/ POLYPECTOMY  2007   [place] [place]     right side   TONSILECTOMY, ADENOIDECTOMY, BILATERAL MYRINGOTOMY AND TUBES      Family History  Problem Relation Age of Onset   Heart disease Mother    Diabetes Mother    Ulcerative colitis Mother    Colon cancer Neg Hx    Esophageal cancer Neg Hx    Rectal cancer Neg Hx    Stomach cancer Neg Hx     No Known Allergies  Current Outpatient Medications on File Prior to Visit  Medication Sig Dispense Refill   aspirin 81 MG tablet Take 81 mg by mouth daily.     dorzolamide (TRUSOPT) 2 % ophthalmic solution Place 1 drop into the right eye 2 (two) times daily.     gemfibrozil (LOPID) 600 MG tablet Take 1 tablet (600 mg total) by mouth 2 (two) times daily before a meal. 180 tablet 3   ketorolac (ACULAR) 0.5 % ophthalmic solution Place 1 drop into the right eye 3 (three) times daily.     Multiple Vitamin (MULTIVITAMIN) tablet Take 1 tablet by mouth daily.     sildenafil (REVATIO) 20 MG tablet Take 1 to 2 tabs 2 - 3 hours before sex 30 tablet 11   No current facility-administered medications on file prior to visit.    There were no vitals taken for this visit.       Objective:   Physical Exam Vitals and nursing note reviewed.  Constitutional:      General: He is not in acute distress.    Appearance: Normal appearance. He is well-developed and normal  weight.  HENT:     Head: Normocephalic and atraumatic.     Right Ear: Tympanic membrane, ear canal and external ear normal. There is no impacted cerumen.     Left Ear: Tympanic membrane, ear canal and external ear normal. There is no impacted cerumen.     Nose: Nose normal. No congestion or rhinorrhea.     Mouth/Throat:     Mouth: Mucous membranes are moist.     Pharynx: Oropharynx is clear. No oropharyngeal exudate or posterior oropharyngeal erythema.  Eyes:     General:        Right eye: No discharge.        Left eye: No discharge.     Extraocular Movements: Extraocular movements intact.     Conjunctiva/sclera: Conjunctivae normal.     Pupils: Pupils are equal, round, and reactive to light.  Neck:     Vascular: No carotid bruit.     Trachea: No tracheal deviation.  Cardiovascular:     Rate and Rhythm: Normal rate and regular rhythm.     Pulses: Normal pulses.     Heart sounds: Normal heart sounds. No murmur heard.   No friction rub. No gallop.  Pulmonary:     Effort: Pulmonary effort is normal. No respiratory distress.     Breath sounds: Normal breath sounds. No stridor. No wheezing, rhonchi or rales.  Chest:     Chest wall: No tenderness.  Abdominal:     General: Bowel sounds are normal. There is no distension.     Palpations: Abdomen is soft. There is no mass.     Tenderness: There is no abdominal tenderness. There is no right CVA tenderness, left CVA tenderness, guarding or rebound.     Hernia: No hernia is present.  Musculoskeletal:        General: No swelling, tenderness, deformity or signs of injury. Normal range of motion.     Right lower leg: No edema.     Left lower leg: No edema.  Lymphadenopathy:     Cervical: No cervical adenopathy.  Skin:    General: Skin is warm and dry.     Capillary Refill: Capillary refill takes less than 2 seconds.     Coloration: Skin is not jaundiced or pale.     Findings: No bruising, erythema, lesion or rash.  Neurological:      General: No focal deficit present.     Mental Status: He is alert and oriented to person, place, and time.     Cranial Nerves: No cranial nerve deficit.     Sensory: No sensory deficit.     Motor: No weakness.     Coordination: Coordination normal.     Gait: Gait normal.     Deep Tendon Reflexes: Reflexes normal.  Psychiatric:        Mood and Affect: Mood normal.        Behavior: Behavior normal.        Thought Content: Thought content normal.        Judgment: Judgment normal.      Assessment & Plan:  1. Routine general medical examination at a health care facility - Benign exam - healthy 75 year old male  - Follow up in one year or sooner if needed - CBC with Differential/Platelet; Future - Comprehensive metabolic panel; Future - Lipid panel; Future - TSH; Future - TSH - Lipid panel - Comprehensive metabolic panel - CBC with Differential/Platelet  2. HYPERTRIGLYCERIDEMIA  - CBC with Differential/Platelet; Future - Comprehensive metabolic panel; Future - Lipid panel; Future - TSH; Future - gemfibrozil (LOPID) 600 MG tablet; Take 1 tablet (600 mg total) by mouth 2 (two) times daily before a meal.  Dispense: 180 tablet; Refill: 3 - TSH - Lipid panel - Comprehensive metabolic panel - CBC with Differential/Platelet  3. BPH associated with nocturia  - PSA; Future - PSA  4.  Situational anxiety  - clonazePAM (KLONOPIN) 0.5 MG tablet; Take 0.5-1 tablets (0.25-0.5 mg total) by mouth at bedtime as needed for anxiety.  Dispense: 15 tablet; Refill: 0  5. Encounter for Medicare annual wellness exam  - Flu Vaccine QUAD High Dose(Fluad)  Dorothyann Peng, NP

## 2021-02-08 NOTE — Patient Instructions (Signed)
It was great seeing you today!  We will follow up with you regarding our blood work.   I will see you back in one year or sooner if needed

## 2021-03-07 DIAGNOSIS — H35351 Cystoid macular degeneration, right eye: Secondary | ICD-10-CM | POA: Diagnosis not present

## 2021-03-07 DIAGNOSIS — H35371 Puckering of macula, right eye: Secondary | ICD-10-CM | POA: Diagnosis not present

## 2021-03-07 DIAGNOSIS — H25811 Combined forms of age-related cataract, right eye: Secondary | ICD-10-CM | POA: Diagnosis not present

## 2021-03-07 DIAGNOSIS — H30111 Disseminated chorioretinal inflammation of posterior pole, right eye: Secondary | ICD-10-CM | POA: Diagnosis not present

## 2021-03-07 DIAGNOSIS — H30021 Focal chorioretinal inflammation of posterior pole, right eye: Secondary | ICD-10-CM | POA: Diagnosis not present

## 2021-03-07 DIAGNOSIS — H35341 Macular cyst, hole, or pseudohole, right eye: Secondary | ICD-10-CM | POA: Diagnosis not present

## 2021-03-27 DIAGNOSIS — H35371 Puckering of macula, right eye: Secondary | ICD-10-CM | POA: Diagnosis not present

## 2021-03-28 DIAGNOSIS — H35371 Puckering of macula, right eye: Secondary | ICD-10-CM | POA: Diagnosis not present

## 2021-04-04 DIAGNOSIS — H35371 Puckering of macula, right eye: Secondary | ICD-10-CM | POA: Diagnosis not present

## 2021-05-16 DIAGNOSIS — H4423 Degenerative myopia, bilateral: Secondary | ICD-10-CM | POA: Diagnosis not present

## 2021-05-16 DIAGNOSIS — H25811 Combined forms of age-related cataract, right eye: Secondary | ICD-10-CM | POA: Diagnosis not present

## 2021-05-16 DIAGNOSIS — H35371 Puckering of macula, right eye: Secondary | ICD-10-CM | POA: Diagnosis not present

## 2021-05-16 DIAGNOSIS — H30111 Disseminated chorioretinal inflammation of posterior pole, right eye: Secondary | ICD-10-CM | POA: Diagnosis not present

## 2021-06-27 ENCOUNTER — Telehealth: Payer: Self-pay | Admitting: Adult Health

## 2021-06-27 NOTE — Telephone Encounter (Signed)
Spoke with patient to schedule schedule Medicare Annual Wellness Visit (AWV) either virtually or in office.   Patient stated he was on way out door to go to [place]  his 4day old grandson was being rushed to hospital.  I told patient I would call him back next week   Last AWV ;07/11/20 please schedule at anytime with [facility] Nurse Health Advisor 1 or 2   This should be a 45 minute visit.

## 2021-07-10 ENCOUNTER — Telehealth: Payer: Self-pay | Admitting: Adult Health

## 2021-07-10 NOTE — Telephone Encounter (Signed)
Tried calling patient to schedule Medicare Annual Wellness Visit (AWV) either virtually or in office.   No answer  Last [facility] 07/09/19 please schedule at anytime with [facility] Nurse Health Advisor 1 or 2   This should be a 45 minute visit.

## 2021-07-31 ENCOUNTER — Encounter: Payer: Self-pay | Admitting: Adult Health

## 2021-07-31 NOTE — Telephone Encounter (Signed)
Please advise 

## 2021-08-01 DIAGNOSIS — H25813 Combined forms of age-related cataract, bilateral: Secondary | ICD-10-CM | POA: Diagnosis not present

## 2021-08-01 DIAGNOSIS — H43811 Vitreous degeneration, right eye: Secondary | ICD-10-CM | POA: Diagnosis not present

## 2021-08-07 ENCOUNTER — Ambulatory Visit (INDEPENDENT_AMBULATORY_CARE_PROVIDER_SITE_OTHER): Payer: Medicare HMO | Admitting: Adult Health

## 2021-08-07 ENCOUNTER — Encounter: Payer: Self-pay | Admitting: Adult Health

## 2021-08-07 ENCOUNTER — Ambulatory Visit (INDEPENDENT_AMBULATORY_CARE_PROVIDER_SITE_OTHER): Payer: Medicare HMO

## 2021-08-07 VITALS — BP 120/80 | HR 66 | Temp 98.4°F | Ht 74.0 in | Wt 187.0 lb

## 2021-08-07 VITALS — Ht 74.0 in | Wt 183.0 lb

## 2021-08-07 DIAGNOSIS — G8929 Other chronic pain: Secondary | ICD-10-CM | POA: Diagnosis not present

## 2021-08-07 DIAGNOSIS — M545 Low back pain, unspecified: Secondary | ICD-10-CM | POA: Diagnosis not present

## 2021-08-07 DIAGNOSIS — Z0000 Encounter for general adult medical examination without abnormal findings: Secondary | ICD-10-CM

## 2021-08-07 MED ORDER — CYCLOBENZAPRINE HCL 10 MG PO TABS: 10.0000 mg | ORAL_TABLET | Freq: Three times a day (TID) | ORAL | 0 refills | Status: DC | PRN

## 2021-08-07 MED ORDER — METHYLPREDNISOLONE 4 MG PO TBPK: ORAL_TABLET | ORAL | 0 refills | Status: DC

## 2021-08-07 NOTE — Patient Instructions (Addendum)
Mr. Marcus Reynolds , Thank you for taking time to come for your Medicare Wellness Visit. I appreciate your ongoing commitment to your health goals. Please review the following plan we discussed and let me know if I can assist you in the future.   These are the goals we discussed:  Goals       DIET - INCREASE WATER INTAKE      Patient Stated      Visit daughter in [place] once pandemic settles       Patient Stated (pt-stated)      I would like to travel internationally.         This is a list of the screening recommended for you and due dates:  Health Maintenance  Topic Date Due   COVID-19 Vaccine (4 - Booster for Pfizer series) 08/08/2021*   Tetanus Vaccine  09/26/2024   Colon Cancer Screening  01/16/2025   Pneumonia Vaccine  Completed   Flu Shot  Completed   Hepatitis C Screening: USPSTF Recommendation to screen - Ages 18-79 yo.  Completed   Zoster (Shingles) Vaccine  Completed   HPV Vaccine  Aged Out  *Topic was postponed. The date shown is not the original due date.    Advanced directives: Yes Patient will bring copy  Conditions/risks identified: None  Next appointment: Follow up in one year for your annual wellness visit.   Preventive Care 10 Years and Older, Male Preventive care refers to lifestyle choices and visits with your health care provider that can promote health and wellness. What does preventive care include? A yearly physical exam. This is also called an annual well check. Dental exams once or twice a year. Routine eye exams. Ask your health care provider how often you should have your eyes checked. Personal lifestyle choices, including: Daily care of your teeth and gums. Regular physical activity. Eating a healthy diet. Avoiding tobacco and drug use. Limiting alcohol use. Practicing safe sex. Taking low doses of aspirin every day. Taking vitamin and mineral supplements as recommended by your health care provider. What happens during an annual well  check? The services and screenings done by your health care provider during your annual well check will depend on your age, overall health, lifestyle risk factors, and family history of disease. Counseling  Your health care provider may ask you questions about your: Alcohol use. Tobacco use. Drug use. Emotional well-being. Home and relationship well-being. Sexual activity. Eating habits. History of falls. Memory and ability to understand (cognition). Work and work Statistician. Screening  You may have the following tests or measurements: Height, weight, and BMI. Blood pressure. Lipid and cholesterol levels. These may be checked every 5 years, or more frequently if you are over 37 years old. Skin check. Lung cancer screening. You may have this screening every year starting at age 49 if you have a 30-pack-year history of smoking and currently smoke or have quit within the past 15 years. Fecal occult blood test (FOBT) of the stool. You may have this test every year starting at age 47. Flexible sigmoidoscopy or colonoscopy. You may have a sigmoidoscopy every 5 years or a colonoscopy every 10 years starting at age 80. Prostate cancer screening. Recommendations will vary depending on your family history and other risks. Hepatitis C blood test. Hepatitis B blood test. Sexually transmitted disease (STD) testing. Diabetes screening. This is done by checking your blood sugar (glucose) after you have not eaten for a while (fasting). You may have this done every 1-3 years. Abdominal  aortic aneurysm (AAA) screening. You may need this if you are a current or former smoker. Osteoporosis. You may be screened starting at age 67 if you are at high risk. Talk with your health care provider about your test results, treatment options, and if necessary, the need for more tests. Vaccines  Your health care provider may recommend certain vaccines, such as: Influenza vaccine. This is recommended every  year. Tetanus, diphtheria, and acellular pertussis (Tdap, Td) vaccine. You may need a Td booster every 10 years. Zoster vaccine. You may need this after age 55. Pneumococcal 13-valent conjugate (PCV13) vaccine. One dose is recommended after age 8. Pneumococcal polysaccharide (PPSV23) vaccine. One dose is recommended after age 31. Talk to your health care provider about which screenings and vaccines you need and how often you need them. This information is not intended to replace advice given to you by your health care provider. Make sure you discuss any questions you have with your health care provider. Document Released: 06/23/2015 Document Revised: 02/14/2016 Document Reviewed: 03/28/2015 Elsevier Interactive Patient Education  2017 [place] Prevention in the Home Falls can cause injuries. They can happen to people of all ages. There are many things you can do to make your home safe and to help prevent falls. What can I do on the outside of my home? Regularly fix the edges of walkways and driveways and fix any cracks. Remove anything that might make you trip as you walk through a door, such as a raised step or threshold. Trim any bushes or trees on the path to your home. Use bright outdoor lighting. Clear any walking paths of anything that might make someone trip, such as rocks or tools. Regularly check to see if handrails are loose or broken. Make sure that both sides of any steps have handrails. Any raised decks and porches should have guardrails on the edges. Have any leaves, snow, or ice cleared regularly. Use sand or salt on walking paths during winter. Clean up any spills in your garage right away. This includes oil or grease spills. What can I do in the bathroom? Use night lights. Install grab bars by the toilet and in the tub and shower. Do not use towel bars as grab bars. Use non-skid mats or decals in the tub or shower. If you need to sit down in the shower, use a  plastic, non-slip stool. Keep the floor dry. Clean up any water that spills on the floor as soon as it happens. Remove soap buildup in the tub or shower regularly. Attach bath mats securely with double-sided non-slip rug tape. Do not have throw rugs and other things on the floor that can make you trip. What can I do in the bedroom? Use night lights. Make sure that you have a light by your bed that is easy to reach. Do not use any sheets or blankets that are too big for your bed. They should not hang down onto the floor. Have a firm chair that has side arms. You can use this for support while you get dressed. Do not have throw rugs and other things on the floor that can make you trip. What can I do in the kitchen? Clean up any spills right away. Avoid walking on wet floors. Keep items that you use a lot in easy-to-reach places. If you need to reach something above you, use a strong step stool that has a grab bar. Keep electrical cords out of the way. Do not use  floor polish or wax that makes floors slippery. If you must use wax, use non-skid floor wax. Do not have throw rugs and other things on the floor that can make you trip. What can I do with my stairs? Do not leave any items on the stairs. Make sure that there are handrails on both sides of the stairs and use them. Fix handrails that are broken or loose. Make sure that handrails are as long as the stairways. Check any carpeting to make sure that it is firmly attached to the stairs. Fix any carpet that is loose or worn. Avoid having throw rugs at the top or bottom of the stairs. If you do have throw rugs, attach them to the floor with carpet tape. Make sure that you have a light switch at the top of the stairs and the bottom of the stairs. If you do not have them, ask someone to add them for you. What else can I do to help prevent falls? Wear shoes that: Do not have high heels. Have rubber bottoms. Are comfortable and fit you  well. Are closed at the toe. Do not wear sandals. If you use a stepladder: Make sure that it is fully opened. Do not climb a closed stepladder. Make sure that both sides of the stepladder are locked into place. Ask someone to hold it for you, if possible. Clearly mark and make sure that you can see: Any grab bars or handrails. First and last steps. Where the edge of each step is. Use tools that help you move around (mobility aids) if they are needed. These include: Canes. Walkers. Scooters. Crutches. Turn on the lights when you go into a dark area. Replace any light bulbs as soon as they burn out. Set up your furniture so you have a clear path. Avoid moving your furniture around. If any of your floors are uneven, fix them. If there are any pets around you, be aware of where they are. Review your medicines with your doctor. Some medicines can make you feel dizzy. This can increase your chance of falling. Ask your doctor what other things that you can do to help prevent falls. This information is not intended to replace advice given to you by your health care provider. Make sure you discuss any questions you have with your health care provider. Document Released: 03/23/2009 Document Revised: 11/02/2015 Document Reviewed: 07/01/2014 Elsevier Interactive Patient Education  2017 [employer].

## 2021-08-07 NOTE — Progress Notes (Signed)
Subjective:   Marcus Reynolds is a 76 y.o. male who presents for Medicare Annual/Subsequent preventive examination.  Review of Systems    Virtual Visit via Telephone Note  I connected with  Marcus Reynolds on 08/07/21 at  9:45 AM EST by telephone and verified that I am speaking with the correct person using two identifiers.  Location: Patient: Home Provider: Home Persons participating in the virtual visit: patient/Nurse Health Advisor   I discussed the limitations, risks, security and privacy concerns of performing an evaluation and management service by telephone and the availability of in person appointments. The patient expressed understanding and agreed to proceed.  Interactive audio and video telecommunications were attempted between this nurse and patient, however failed, due to patient having technical difficulties OR patient did not have access to video capability.  We continued and completed visit with audio only.  Some vital signs may be absent or patient reported.   Marcus Peaches, LPN  Cardiac Risk Factors include: advanced age (>29men, >85 women);male gender     Objective:    Today's Vitals   08/07/21 0953 08/07/21 0954  Weight: 183 lb (83 kg)   Height: 6\' 2"  (1.88 m)   PainSc:  3    Body mass index is 23.5 kg/m.  Advanced Directives 08/07/2021 07/11/2020 07/09/2019 12/02/2017 06/26/2017 12/07/2014  Does Patient Have a Medical Advance Directive? Yes Yes Yes No Yes Yes  Type of Paramedic of [place];Living will [place];Living will [place];Living will - [place];Living will [place];Living will  Does patient want to make changes to medical advance directive? No - Patient declined No - Patient declined No - Patient declined - No - Patient declined -  Copy of [place] in Chart? No - copy requested No - copy requested No - copy requested - - -  Would  patient like information on creating a medical advance directive? - - - No - Patient declined - -    Current Medications (verified) Outpatient Encounter Medications as of 08/07/2021  Medication Sig   clonazePAM (KLONOPIN) 0.5 MG tablet Take 0.5-1 tablets (0.25-0.5 mg total) by mouth at bedtime as needed for anxiety.   dorzolamide (TRUSOPT) 2 % ophthalmic solution Place 1 drop into the right eye 2 (two) times daily. (Patient not taking: Reported on 02/08/2021)   gemfibrozil (LOPID) 600 MG tablet Take 1 tablet (600 mg total) by mouth 2 (two) times daily before a meal.   ketorolac (ACULAR) 0.5 % ophthalmic solution Place 1 drop into the right eye 3 (three) times daily. (Patient not taking: Reported on 02/08/2021)   Multiple Vitamin (MULTIVITAMIN) tablet Take 1 tablet by mouth daily.   sildenafil (REVATIO) 20 MG tablet Take 1 to 2 tabs 2 - 3 hours before sex   No facility-administered encounter medications on file as of 08/07/2021.    Allergies (verified) Patient has no known allergies.   History: Past Medical History:  Diagnosis Date   Hyperlipidemia    high tri   Personal history of colonic polyps 12/25/2010   Past Surgical History:  Procedure Laterality Date   COLONOSCOPY  12/25/2010   internal hemorrhoids   COLONOSCOPY W/ POLYPECTOMY  2007   [place], [place]   INGUINAL HERNIA REPAIR     right side   TONSILECTOMY, ADENOIDECTOMY, BILATERAL MYRINGOTOMY AND TUBES     Family History  Problem Relation Age of Onset   Heart disease Mother    Diabetes Mother  Ulcerative colitis Mother    Colon cancer Neg Hx    Esophageal cancer Neg Hx    Rectal cancer Neg Hx    Stomach cancer Neg Hx    Social History   Socioeconomic History   Marital status: Married    Spouse name: Not on file   Number of children: 2   Years of education: Not on file   Highest education level: Professional school degree (e.g., MD, DDS, DVM, JD)  Occupational History   Occupation: Retired  Tobacco Use   Smoking  status: Never   Smokeless tobacco: Never  Vaping Use   Vaping Use: Never used  Substance and Sexual Activity   Alcohol use: Yes    Alcohol/week: 1.0 standard drink    Types: 1 Glasses of wine per week    Comment: socially   Drug use: No   Sexual activity: Not on file  Other Topics Concern   Not on file  Social History Narrative   Retired   [employer] 2   Married    Volunteers at [place] Strain: Low Risk    Difficulty of Paying Living Expenses: Not hard at all  Food Insecurity: No Food Insecurity   Worried About Charity fundraiser in the Last Year: Never true   Arboriculturist in the Last Year: Never true  Transportation Needs: No Transportation Needs   Lack of Transportation (Medical): No   Lack of Transportation (Non-Medical): No  Physical Activity: Sufficiently Active   Days of Exercise per Week: 5 days   Minutes of Exercise per Session: 60 min  Stress: No Stress Concern Present   Feeling of Stress : Not at all  Social Connections: Socially Integrated   Frequency of Communication with Friends and Family: Three times a week   Frequency of Social Gatherings with Friends and Family: Three times a week   Attends Religious Services: 1 to 4 times per year   Active Member of Clubs or Organizations: Yes   Attends Archivist Meetings: 1 to 4 times per year   Marital Status: Married     Clinical Intake:   Diabetic? No  Interpreter Needed?: No Activities of Daily Living In your present state of health, do you have any difficulty performing the following activities: 08/07/2021  Hearing? N  Vision? N  Difficulty concentrating or making decisions? N  Walking or climbing stairs? N  Dressing or bathing? N  Doing errands, shopping? N  Preparing Food and eating ? N  Using the Toilet? N  In the past six months, have you accidently leaked urine? N  Do you have problems with loss of bowel control? N   Managing your Medications? N  Managing your Finances? N  Housekeeping or managing your Housekeeping? N  Some recent data might be hidden    Patient Care Team: Marcus Peng, NP as PCP - General (Family Medicine)  Indicate any recent Medical Services you may have received from other than [facility] providers in the past year (date may be approximate).     Assessment:   This is a routine wellness examination for [name].  Hearing/Vision screen Hearing Screening - Comments:: Patient has hearing loss in left ear but does not wear hearing aids Vision Screening - Comments:: Wears glasses. Followed by [facility]  Dietary issues and exercise activities discussed: Exercise limited by: None identified   Goals Addressed  This Visit's Progress     Patient Stated (pt-stated)        I would like to travel internationally.        Depression Screen PHQ 2/9 Scores 08/07/2021 07/11/2020 07/09/2019 10/30/2017 10/23/2016 10/11/2015 09/27/2014  PHQ - 2 Score 0 0 0 0 0 0 0    Fall Risk Fall Risk  08/07/2021 02/08/2021 07/11/2020 07/09/2019 10/30/2017  Falls in the past year? 0 0 0 0 No  Number falls in past yr: 0 0 0 - -  Injury with Fall? 0 0 0 - -  Risk for fall due to : No Fall Risks - No Fall Risks Medication side effect;Orthopedic patient -  Follow up - - Falls evaluation completed;Falls prevention discussed Falls evaluation completed;Education provided;Falls prevention discussed -    FALL RISK PREVENTION PERTAINING TO THE HOME:  Any stairs in or around the home? Yes If so, are there any without handrails? No  Home free of loose throw rugs in walkways, pet beds, electrical cords, etc? Yes  Adequate lighting in your home to reduce risk of falls? Yes   ASSISTIVE DEVICES UTILIZED TO PREVENT FALLS:  Life alert? No  Use of a cane, walker or w/c? No  Grab bars in the bathroom? Yes  Shower chair or bench in shower? Yes  Elevated toilet seat or a handicapped toilet? No   TIMED UP  AND GO:  Was the test performed? No . Audio Visit  Cognitive Function:     6CIT Screen 08/07/2021 07/09/2019  What Year? 0 points 0 points  What month? 0 points 0 points  What time? 0 points 0 points  Count back from 20 0 points 0 points  Months in reverse 0 points 0 points  Repeat phrase 0 points 0 points  Total Score 0 0    Immunizations Immunization History  Administered Date(s) Administered   Fluad Quad(high Dose 65+) 02/04/2019, 02/08/2021   Influenza Split 03/26/2011, 03/27/2012   Influenza Whole 06/13/2009   Influenza, High Dose Seasonal PF 03/14/2015, 02/29/2016, 03/13/2017, 03/13/2018   Influenza,inj,Quad PF,6+ Mos 03/24/2013, 03/22/2014   Influenza-Unspecified 02/24/2020   PFIZER(Purple Top)SARS-COV-2 Vaccination 06/30/2019, 07/21/2019, 03/24/2020   Pneumococcal Conjugate-13 09/23/2013   Pneumococcal Polysaccharide-23 07/09/2010   Td 09/27/2014   Zoster Recombinat (Shingrix) 02/13/2019, 06/15/2019   Zoster, Live 06/10/2006    TDAP status: Up to date  Flu Vaccine status: Up to date  Pneumococcal vaccine status: Up to date  Covid-19 vaccine status: Completed vaccines  Qualifies for Shingles Vaccine? Yes   Zostavax completed Yes   Shingrix Completed?: Yes  Screening Tests Health Maintenance  Topic Date Due   COVID-19 Vaccine (4 - Booster for Pfizer series) 08/08/2021 (Originally 05/19/2020)   TETANUS/TDAP  09/26/2024   COLONOSCOPY (Pts 45-33yrs Insurance coverage will need to be confirmed)  01/16/2025   Pneumonia Vaccine 98+ Years old  Completed   INFLUENZA VACCINE  Completed   Hepatitis C Screening  Completed   Zoster Vaccines- Shingrix  Completed   HPV VACCINES  Aged Out    Health Maintenance  There are no preventive care reminders to display for this patient.  Colorectal cancer screening: Type of screening: Colonoscopy. Completed 01/17/20. Repeat every 5 years  Lung Cancer Screening: (Low Dose CT Chest recommended if Age 49-80 years, 30 pack-year  currently smoking OR have quit w/in 15years.) does not qualify.     Hepatitis C Screening: does qualify; Completed 02/04/19  Vision Screening: Recommended annual ophthalmology exams for early detection of glaucoma and other disorders of  the eye. Is the patient up to date with their annual eye exam?  Yes  Who is the provider or what is the name of the office in which the patient attends annual eye exams? [facility] If pt is not established with a provider, would they like to be referred to a provider to establish care? No .   Dental Screening: Recommended annual dental exams for proper oral hygiene  Community Resource Referral / Chronic Care Management:  CRR required this visit?  No   CCM required this visit?  No      Plan:     I have personally reviewed and noted the following in the patients chart:   Medical and social history Use of alcohol, tobacco or illicit drugs  Current medications and supplements including opioid prescriptions. Patient is not currently taking opioid prescriptions. Functional ability and status Nutritional status Physical activity Advanced directives List of other physicians Hospitalizations, surgeries, and ER visits in previous 12 months Vitals Screenings to include cognitive, depression, and falls Referrals and appointments  In addition, I have reviewed and discussed with patient certain preventive protocols, quality metrics, and best practice recommendations. A written personalized care plan for preventive services as well as general preventive health recommendations were provided to patient.     Marcus Peaches, LPN   [id number]/56/[id number]   Nurse Note: None

## 2021-08-07 NOTE — Progress Notes (Signed)
Subjective:    Patient ID: Marcus Reynolds, male    DOB: 01-16-1946, 76 y.o.   MRN: [id number]  HPI 76 year old male who  has a past medical history of Hyperlipidemia and Personal history of colonic polyps (12/25/2010).  He has known lumbar spinal stenosis.  Last lumbar spine x-ray in 2019 showed IMPRESSION: 1. No fracture.  Grade 1 L3-4 anterolisthesis without spondylolysis. 2. 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 L3-4. Moderate canal stenosis L2-3 and L4-5. 3. Neural foraminal narrowing L3-4 through L5-S1: Moderate on the RIGHT at L4-5.  He has been seen by sports medicine as well as Dr. Ernestina Patches back in 2019, at which time he had a couple of epidural injections which provided some relief but felt as though PT helped out the most.   Over the last 6 to 8 weeks the pain has come back, especially in the morning.  He has been taking over-the-counter NSAIDs and doing exercises that were shown to him by physical therapy.  Despite these modalities he continues to have constant pain in his buttock/rear upper legs. Pain is about 3-4 /10.     Review of Systems See HPI   Past Medical History:  Diagnosis Date   Hyperlipidemia    high tri   Personal history of colonic polyps 12/25/2010    Social History   Socioeconomic History   Marital status: Married    Spouse name: Not on file   Number of children: 2   Years of education: Not on file   Highest education level: Professional school degree (e.g., MD, DDS, DVM, JD)  Occupational History   Occupation: Retired  Tobacco Use   Smoking status: Never   Smokeless tobacco: Never  Vaping Use   Vaping Use: Never used  Substance and Sexual Activity   Alcohol use: Yes    Alcohol/week: 1.0 standard drink    Types: 1 Glasses of wine per week    Comment: socially   Drug use: No   Sexual activity: Not on file  Other Topics Concern   Not on file  Social History Narrative   Retired   [employer] 2   Married    Volunteers at [place] Strain: Low Risk    Difficulty of Paying Living Expenses: Not hard at all  Food Insecurity: No Food Insecurity   Worried About Charity fundraiser in the Last Year: Never true   Arboriculturist in the Last Year: Never true  Transportation Needs: No Transportation Needs   Lack of Transportation (Medical): No   Lack of Transportation (Non-Medical): No  Physical Activity: Sufficiently Active   Days of Exercise per Week: 5 days   Minutes of Exercise per Session: 60 min  Stress: No Stress Concern Present   Feeling of Stress : Not at all  Social Connections: Socially Integrated   Frequency of Communication with Friends and Family: Three times a week   Frequency of Social Gatherings with Friends and Family: Three times a week   Attends Religious Services: 1 to 4 times per year   Active Member of Clubs or Organizations: Yes   Attends Archivist Meetings: 1 to 4 times per year   Marital Status: Married  Human resources officer Violence: Not At Risk   Fear of Current or Ex-Partner: No   Emotionally Abused: No   Physically Abused: No   Sexually Abused: No  Past Surgical History:  Procedure Laterality Date   COLONOSCOPY  12/25/2010   internal hemorrhoids   COLONOSCOPY W/ POLYPECTOMY  2007   [place], [place]   INGUINAL HERNIA REPAIR     right side   TONSILECTOMY, ADENOIDECTOMY, BILATERAL MYRINGOTOMY AND TUBES      Family History  Problem Relation Age of Onset   Heart disease Mother    Diabetes Mother    Ulcerative colitis Mother    Colon cancer Neg Hx    Esophageal cancer Neg Hx    Rectal cancer Neg Hx    Stomach cancer Neg Hx     No Known Allergies  Current Outpatient Medications on File Prior to Visit  Medication Sig Dispense Refill   clonazePAM (KLONOPIN) 0.5 MG tablet Take 0.5-1 tablets (0.25-0.5 mg total) by mouth at bedtime as needed for anxiety. 15 tablet 0   dorzolamide (TRUSOPT) 2 % ophthalmic  solution Place 1 drop into the right eye 2 (two) times daily. (Patient not taking: Reported on 02/08/2021)     gemfibrozil (LOPID) 600 MG tablet Take 1 tablet (600 mg total) by mouth 2 (two) times daily before a meal. 180 tablet 3   ketorolac (ACULAR) 0.5 % ophthalmic solution Place 1 drop into the right eye 3 (three) times daily. (Patient not taking: Reported on 02/08/2021)     Multiple Vitamin (MULTIVITAMIN) tablet Take 1 tablet by mouth daily.     sildenafil (REVATIO) 20 MG tablet Take 1 to 2 tabs 2 - 3 hours before sex 30 tablet 11   No current facility-administered medications on file prior to visit.    There were no vitals taken for this visit.      Objective:   Physical Exam Vitals and nursing note reviewed.  Constitutional:      Appearance: Normal appearance.  Cardiovascular:     Rate and Rhythm: Normal rate and regular rhythm.     Pulses: Normal pulses.     Heart sounds: Normal heart sounds.  Pulmonary:     Effort: Pulmonary effort is normal.     Breath sounds: Normal breath sounds.  Musculoskeletal:        General: Normal range of motion.  Skin:    General: Skin is warm and dry.  Neurological:     General: No focal deficit present.     Mental Status: He is alert and oriented to person, place, and time.  Psychiatric:        Mood and Affect: Mood normal.        Behavior: Behavior normal.        Thought Content: Thought content normal.        Judgment: Judgment normal.       Assessment & Plan:  1. Chronic midline low back pain without sciatica -We will start with Medrol Dosepak and Flexeril.  We will also refer to physical therapy.  If this does not provide relief then we can refer back to see Dr. Ernestina Patches for epidural injection. - methylPREDNISolone (MEDROL DOSEPAK) 4 MG TBPK tablet; Take as directed  Dispense: 21 tablet; Refill: 0 - cyclobenzaprine (FLEXERIL) 10 MG tablet; Take 1 tablet (10 mg total) by mouth 3 (three) times daily as needed for muscle spasms.  Dispense:  30 tablet; Refill: 0 - Ambulatory referral to Physical Therapy  Dorothyann Peng, NP  Time of total for care on the day of the encounter 34 minutes. This includes time spent in both face-to-face and non-face-to-face activities including preparing for the visit, reviewing the chart,  time spent with patient, evaluation and counseling patient/family/caregiver, coordinating care, and time spent documenting in the chart which was performed on the date of service (08/07/2021). Note: this excludes any time spent performing billable procedures or separate charges (such as time spent counseling smoking cessation); these charges are billed separately.

## 2021-08-21 ENCOUNTER — Encounter: Payer: Self-pay | Admitting: Adult Health

## 2021-08-22 DIAGNOSIS — H35351 Cystoid macular degeneration, right eye: Secondary | ICD-10-CM | POA: Diagnosis not present

## 2021-08-22 DIAGNOSIS — H40013 Open angle with borderline findings, low risk, bilateral: Secondary | ICD-10-CM | POA: Diagnosis not present

## 2021-08-22 DIAGNOSIS — H35371 Puckering of macula, right eye: Secondary | ICD-10-CM | POA: Diagnosis not present

## 2021-08-22 DIAGNOSIS — H25811 Combined forms of age-related cataract, right eye: Secondary | ICD-10-CM | POA: Diagnosis not present

## 2021-08-27 NOTE — Therapy (Signed)
?OUTPATIENT PHYSICAL THERAPY THORACOLUMBAR EVALUATION ? ? ?Patient Name: Marcus Reynolds ?MRN: [id number] ?DOB:[birth year], 76 y.o., male ?Today's Date: 08/28/2021 ? ? PT End of Session - 08/28/21 1240   ? ? Visit Number 1   ? Date for PT Re-Evaluation 10/23/21   ? Authorization Type [employer] Medicare   ? PT Start Time 1231   ? PT Stop Time 1314   ? PT Time Calculation (min) 43 min   ? Activity Tolerance Patient tolerated treatment well   ? ?  ?  ? ?  ? ? ?Past Medical History:  ?Diagnosis Date  ? Hyperlipidemia   ? high tri  ? Personal history of colonic polyps 12/25/2010  ? ?Past Surgical History:  ?Procedure Laterality Date  ? COLONOSCOPY  12/25/2010  ? internal hemorrhoids  ? COLONOSCOPY W/ POLYPECTOMY  2007  ? [place], [place]  ? INGUINAL HERNIA REPAIR    ? right side  ? TONSILECTOMY, ADENOIDECTOMY, BILATERAL MYRINGOTOMY AND TUBES    ? ?Patient Active Problem List  ? Diagnosis Date Noted  ? Osteoarthritis of spine with radiculopathy, lumbar region 08/04/2017  ? Routine general medical examination at a health care facility 09/23/2013  ? ED (erectile dysfunction) 07/23/2011  ? History of colonic polyps 12/25/2010  ? HYPERTRIGLYCERIDEMIA 06/13/2009  ? ? ?PCP: Dorothyann Peng, NP ? ?REFERRING PROVIDER: Dorothyann Peng, NP ? ?REFERRING DIAG: chronic midline low back pain without sciatica ? ?THERAPY DIAG:  ?Low back pain ? ?ONSET DATE: 06/10/2021 ? ?SUBJECTIVE:                                                                                                                                                                                          ? ?SUBJECTIVE STATEMENT: ?Previous history of stenosis did well for 4 years.  In December, flared up for no known reason.  Had steroid dose back which helped a little.  Takes muscle relaxer at night.   ?PERTINENT HISTORY:  ?Had PT a few years ago at [facility] DN and traction; trip to [place] at the end of June for 10 days; can walk 3 miles with medication and thigh brace; goes to [employer] to  do bike, arm ex's  ? ?PAIN:  ?Are you having pain? Yes: 7-8/10NPRS scale: 7-8/10 ?Pain location: buttocks, post thighs  ?Pain description: doesn't go down below knees, no numbness/tingling ?Aggravating factors: AM; figure 4 stretch learned on internet so stopped: sidelying leg raises; bridges aggravate; sit to stand; up/down; lifting 10-20#; stairs in the AM ?Relieving factors: knee/thigh support on left, sit on soft pillow  ? ? ?PRECAUTIONS: None ? ?WEIGHT BEARING RESTRICTIONS No ? ?FALLS:  ?Has patient fallen in  last 6 months? No, Number of falls: 0 ? ?LIVING ENVIRONMENT: ?Lives with: lives with their spouse ?Lives in: House/apartment ?Stairs: Yes;  ?Has following equipment at home: None ? ?OCCUPATION: retired  ? ?PLOF: Independent ? ?PATIENT GOALS go on trip to [place] in June ? ? ?OBJECTIVE:  ? ?DIAGNOSTIC FINDINGS:  ?Arthritis in spine ? ?PATIENT SURVEYS:  ?FOTO 53%  ? ?SCREENING FOR RED FLAGS: ?Bowel or bladder incontinence: No ?Spinal tumors: No ?Cauda equina syndrome: No ?Compression fracture: No ?Abdominal aneurysm: No ? ?COGNITION: ? Overall cognitive status: Within functional limits for tasks assessed   ?  ? ? ?MUSCLE LENGTH: ?Hamstrings: Right 40 deg; Left 40 deg ?Decreased hip flexor lengths 0 degrees in sidelying ?POSTURE:  ?Dec lumbar lordosis ? ?PALPATION: ?Tenderness in lumbar soft tissue and gluteals bil ? ?LUMBAR ROM:   Prone lying uncomfortable ? ?Active  A/PROM  ?08/28/2021  ?Flexion Fingertips to 12 in from toes   ?Extension 3 painful   ?Right lateral flexion 20  ?Left lateral flexion 20  ?Right rotation   ?Left rotation   ? (Blank rows = not tested) ? ? ?LE MMT:  ? Relies on UE assist with sit to stand; decreased lower abdominal and trunk extensor strength  ?MMT Right ?08/28/2021 Left ?08/28/2021  ?Hip flexion 4 4  ?Hip extension 4 4  ?Hip abduction 4- 4-  ?Hip adduction    ?Hip internal rotation    ?Hip external rotation    ?Knee flexion    ?Knee extension    ?Ankle dorsiflexion    ?Ankle  plantarflexion    ?Ankle inversion    ?Ankle eversion    ? (Blank rows = not tested) ? ?LUMBAR SPECIAL TESTS: right > left ?Straight leg raise test: Positive, Slump test: Positive, and Single leg stance test: Negative ? ?GAIT: ?No device, dec hip extension noted ? ?TODAY'S TREATMENT  ?Discussed quadruped or leaning on bed rocking or cat/cow in the AM for pain modulation ? ? ?PATIENT EDUCATION:  ?Education details: with start medbridge next visit ?Person educated: Patient ?Education method: Explanation ?Education comprehension: verbalized understanding ? ? ?HOME EXERCISE PROGRAM: ?Start Medbridge next visit ? ?ASSESSMENT: ? ?CLINICAL IMPRESSION: ?Patient is a 76 y.o. male who was seen today for physical therapy evaluation and treatment for chronic low back pain.  He is well known to this clinic and therapist from past treatment for LBP/stenosis and responded well to manual therapy, DN, traction and exercise.  He did well for several years but had an exacerbation in Dec/January for no apparent reason.  His lumbar ROM is very limited with a neutral to flexion bias, worsening with standing extension and prone lying.   Lumbar fascial restriction noted.  Decreased activation and strength noted in lumbo/pelvic/hip muscles.  Increased pain noted with rolling and transitional movements.  He would benefit from PT to address these deficits and to meet his goal of traveling to [place] this June.   ? ? ?OBJECTIVE IMPAIRMENTS decreased activity tolerance, decreased mobility, decreased ROM, decreased strength, increased fascial restrictions, impaired flexibility, and pain.  ? ?ACTIVITY LIMITATIONS community activity, shopping, and travel .  ? ?PERSONAL FACTORS Past/current experiences and Time since onset of injury/illness/exacerbation are also affecting patient's functional outcome.  ? ? ?REHAB POTENTIAL: Good ? ?CLINICAL DECISION MAKING: Stable/uncomplicated ? ?EVALUATION COMPLEXITY: Low ? ? ?GOALS: ?Goals reviewed with patient?  Yes ? ?SHORT TERM GOALS: Target date: 09/25/2021 ? ?The patient will demonstrate knowledge of basic self care strategies and compliant with basic home exercises ?Baseline:  ?Goal  status: INITIAL ? ?2.  The patient will report a 50% improvement in buttock, thigh pain in the AM with sit to stand and going up/down the stairs ?Baseline:  ?Goal status: INITIAL ? ?3.  The patient will have improved lumbar flexion to 6 inches from toes and HS length to 50 deg bil needed for dressing, grooming tasks ?Baseline:  ?Goal status: INITIAL ? ? ?LONG TERM GOALS: Target date: 10/23/2021 ? ?The patient will be independent with safe self progression of a HEP and return to the gym at [employer] for further improvement in function ?Baseline:  ?Goal status: INITIAL ? ?2.  The patient will report a 75% improvement in bil buttock and thigh pain in the mornings and with transitional movements like sit to stand and stairs ?Baseline:  ?Goal status: INITIAL ? ?3.  The patient will have improved lumbo-pelvic-hip strength to grossly 4+/5 needed for lifting luggage and heavier grocery items ?Baseline:  ?Goal status: INITIAL ? ?4.  The patient will have improved lumbar and LE mobility and flexibility to [facility] needed for upcoming travel out of the country ?Baseline:  ?Goal status: INITIAL ? ?5.  FOTO score improved from 53% to 66% ?Baseline:  ?Goal status: INITIAL ? ? ? ?PLAN: ?PT FREQUENCY: 2x/week ? ?PT DURATION: 8 weeks ? ?PLANNED INTERVENTIONS: Therapeutic exercises, Therapeutic activity, Neuromuscular re-education, Balance training, Gait training, Patient/Family education, Joint mobilization, Aquatic Therapy, Dry Needling, Electrical stimulation, Spinal manipulation, Spinal mobilization, Moist heat, Taping, Traction, Ultrasound, Ionotophoresis '4mg'$ /ml Dexamethasone, and Manual therapy. ? ?PLAN FOR NEXT SESSION: neutral and/or flexion bias ex's; start [place] with quad rocks, cat/cow; limited time in prone tolerated try 1 pillow if needed for  DN (add ES?);  may be a candidate for traction ? ?[name], PT ?08/28/21 3:57 PM ?Phone: [phone] ?Fax: [phone]  ?[name], PT ?08/28/2021, 3:36 PM  ?

## 2021-08-28 ENCOUNTER — Other Ambulatory Visit: Payer: Self-pay

## 2021-08-28 ENCOUNTER — Ambulatory Visit: Payer: Medicare HMO | Attending: Adult Health | Admitting: Physical Therapy

## 2021-08-28 ENCOUNTER — Encounter: Payer: Self-pay | Admitting: Physical Therapy

## 2021-08-28 DIAGNOSIS — M545 Low back pain, unspecified: Secondary | ICD-10-CM | POA: Diagnosis not present

## 2021-08-28 DIAGNOSIS — G8929 Other chronic pain: Secondary | ICD-10-CM | POA: Insufficient documentation

## 2021-08-28 DIAGNOSIS — M6281 Muscle weakness (generalized): Secondary | ICD-10-CM | POA: Diagnosis not present

## 2021-08-29 ENCOUNTER — Ambulatory Visit: Payer: Medicare HMO | Admitting: Physical Therapy

## 2021-08-30 ENCOUNTER — Ambulatory Visit: Payer: Medicare HMO | Admitting: Physical Therapy

## 2021-08-30 ENCOUNTER — Other Ambulatory Visit: Payer: Self-pay

## 2021-08-30 DIAGNOSIS — G8929 Other chronic pain: Secondary | ICD-10-CM | POA: Diagnosis not present

## 2021-08-30 DIAGNOSIS — M545 Low back pain, unspecified: Secondary | ICD-10-CM | POA: Diagnosis not present

## 2021-08-30 DIAGNOSIS — M6281 Muscle weakness (generalized): Secondary | ICD-10-CM | POA: Diagnosis not present

## 2021-08-30 NOTE — Patient Instructions (Signed)
[email or web address] ? ?Access Code: [email or web address] ?URL: [email or web address] ?Date: 08/30/2021 ?Prepared by: [name] ? ?Exercises ?- Seated Cat Cow  - 1 x daily - 7 x weekly - 1 sets - 10 reps ?- Seated Table Hamstring Stretch  - 1 x daily - 7 x weekly - 1 sets - 5 reps - 10 hold ?Marcello Moores Stretch on Table  - 1 x daily - 7 x weekly - 1 sets - 2 reps - 60 hold ?- Standing Hip Abduction  - 1 x daily - 7 x weekly - 1 sets - 15 reps ?- Standing Hip Extension  - 1 x daily - 7 x weekly - 3 sets - 10 reps ? ?

## 2021-08-30 NOTE — Therapy (Signed)
?OUTPATIENT PHYSICAL THERAPY TREATMENT NOTE ? ? ?Patient Name: Marcus Reynolds ?MRN: [id number] ?DOB:Apr 23, 1946, 76 y.o., male ?Today's Date: 08/30/2021 ? ?PCP: Dorothyann Peng, NP ?REFERRING PROVIDER: Dorothyann Peng, NP ? ? PT End of Session - 08/30/21 1146   ? ? Visit Number 2   ? Date for PT Re-Evaluation 10/23/21   ? Authorization Type Aetna Medicare   ? PT Start Time 1146   ? PT Stop Time 1230   ? PT Time Calculation (min) 44 min   ? Activity Tolerance Patient tolerated treatment well   ? ?  ?  ? ?  ? ? ?Past Medical History:  ?Diagnosis Date  ? Hyperlipidemia   ? high tri  ? Personal history of colonic polyps 12/25/2010  ? ?Past Surgical History:  ?Procedure Laterality Date  ? COLONOSCOPY  12/25/2010  ? internal hemorrhoids  ? COLONOSCOPY W/ POLYPECTOMY  2007  ? [place], [place]  ? INGUINAL HERNIA REPAIR    ? right side  ? TONSILECTOMY, ADENOIDECTOMY, BILATERAL MYRINGOTOMY AND TUBES    ? ?Patient Active Problem List  ? Diagnosis Date Noted  ? Osteoarthritis of spine with radiculopathy, lumbar region 08/04/2017  ? Routine general medical examination at a health care facility 09/23/2013  ? ED (erectile dysfunction) 07/23/2011  ? History of colonic polyps 12/25/2010  ? HYPERTRIGLYCERIDEMIA 06/13/2009  ? ? ?REFERRING DIAG: back pain  ? ?THERAPY DIAG:  ?Chronic bilateral low back pain without sciatica ? ?Muscle weakness (generalized) ? ? ?SUBJECTIVE: I had some 800 mg of ibuprofen.  I'm uncomfortable with the quadruped rocking but OK in standing.   ? ?PAIN:  ?Are you having pain? PAIN:  ?Are you having pain? Yes ?NPRS scale: 4/10 ?Pain location: backs of legs ?Pain orientation: Right and Left  ?PAIN TYPE: aching ? ?PCP: Dorothyann Peng, NP ?  ?REFERRING PROVIDER: Dorothyann Peng, NP ?  ?REFERRING DIAG: chronic midline low back pain without sciatica ?  ?THERAPY DIAG:  ?Low back pain ?  ?ONSET DATE: 06/10/2021 ?  ?SUBJECTIVE:                                                                                                                                                                                           ?  ?SUBJECTIVE STATEMENT: ?Previous history of stenosis did well for 4 years.  In December, flared up for no known reason.  Had steroid dose back which helped a little.  Takes muscle relaxer at night.   ?PERTINENT HISTORY:  ?Had PT a few years ago at [place] DN and traction; trip to [place] at the end of June for 10 days; can walk 3 miles with medication and  thigh brace; goes to [employer] to do bike, arm ex's  ?  ?PAIN:  ?Are you having pain? Yes: 7-8/10NPRS scale: 7-8/10 ?Pain location: buttocks, post thighs  ?Pain description: doesn't go down below knees, no numbness/tingling ?Aggravating factors: AM; figure 4 stretch learned on internet so stopped: sidelying leg raises; bridges aggravate; sit to stand; up/down; lifting 10-20#; stairs in the AM ?Relieving factors: knee/thigh support on left, sit on soft pillow  ?  ?  ?PRECAUTIONS: None ?  ?WEIGHT BEARING RESTRICTIONS No ?  ?FALLS:  ?Has patient fallen in last 6 months? No, Number of falls: 0 ?  ?LIVING ENVIRONMENT: ?Lives with: lives with their spouse ?Lives in: House/apartment ?Stairs: Yes;  ?Has following equipment at home: None ?  ?OCCUPATION: retired  ?  ?PLOF: Independent ?  ?PATIENT GOALS go on trip to [place] in June ?  ?  ?OBJECTIVE:  ?  ?DIAGNOSTIC FINDINGS:  ?Arthritis in spine ?  ?PATIENT SURVEYS:  ?FOTO 53%  ?  ?SCREENING FOR RED FLAGS: ?Bowel or bladder incontinence: No ?Spinal tumors: No ?Cauda equina syndrome: No ?Compression fracture: No ?Abdominal aneurysm: No ?  ?COGNITION: ?          Overall cognitive status: Within functional limits for tasks assessed               ?           ?  ?  ?MUSCLE LENGTH: ?Hamstrings: Right 40 deg; Left 40 deg ?Decreased hip flexor lengths 0 degrees in sidelying ?POSTURE:  ?Dec lumbar lordosis ?  ?PALPATION: ?Tenderness in lumbar soft tissue and gluteals bil ?  ?LUMBAR ROM:   Prone lying uncomfortable ?  ?Active  A/PROM  ?08/28/2021   ?Flexion Fingertips to 12 in from toes   ?Extension 3 painful   ?Right lateral flexion 20  ?Left lateral flexion 20  ?Right rotation    ?Left rotation    ? (Blank rows = not tested) ?  ?  ?LE MMT:  ?           Relies on UE assist with sit to stand; decreased lower abdominal and trunk extensor strength  ?MMT Right ?08/28/2021 Left ?08/28/2021  ?Hip flexion 4 4  ?Hip extension 4 4  ?Hip abduction 4- 4-  ?Hip adduction      ?Hip internal rotation      ?Hip external rotation      ?Knee flexion      ?Knee extension      ?Ankle dorsiflexion      ?Ankle plantarflexion      ?Ankle inversion      ?Ankle eversion      ? (Blank rows = not tested) ?  ?LUMBAR SPECIAL TESTS: right > left ?Straight leg raise test: Positive, Slump test: Positive, and Single leg stance test: Negative ?  ?GAIT: ?No device, dec hip extension noted ?  ?TODAY'S TREATMENT 08/30/2021 ?Instruction on exercises to decrease overactivation of lumbar paraspinals: seated cat/cow with exhalation 10x ?Discussed quadruped or leaning on bed rocking or cat/cow in the AM for pain modulation ?Long sit on edge of bed HS stretch with ankle pump 10x right/left ?DKTC with LE dangle over side of bed 1 minute each side ?Standing hip abduction 10x right/left ?Standing bent over high table bent knee hip extension 2 sets of 5 each leg ? ? ?  ?  ?PATIENT EDUCATION:  ?Education details: Medbridge ?Person educated: Patient ?Education method: Explanation ?Education comprehension: verbalized understanding ?  ?  ?HOME EXERCISE PROGRAM: Access  Code: [email or web address] ?URL: [email or web address] ?Date: 08/30/2021 ?Prepared by: [name] ? ?Exercises ?- Seated Cat Cow  - 1 x daily - 7 x weekly - 1 sets - 10 reps ?- Seated Table Hamstring Stretch  - 1 x daily - 7 x weekly - 1 sets - 5 reps - 10 hold ?Marcello Moores Stretch on Table  - 1 x daily - 7 x weekly - 1 sets - 2 reps - 60 hold ?- Standing Hip Abduction  - 1 x daily - 7 x weekly - 1 sets - 15 reps ?- Standing Hip Extension  - 1  x daily - 7 x weekly - 3 sets - 10 reps ? ? ? ?  ?ASSESSMENT: ?  ?CLINICAL IMPRESSION: ?Initiated exs to promote spinal "opening" ex's including ex's to deactivate lumbar paraspinals, neural mobility and strengthen gluteal muscles.  He initially reports increased "tightness" in thighs with ex's but eventually reports he feels a little better at the conclusion of session.  Verbal cues to avoid lumbar arching.  Therapist monitoring response and modifying treatment accordingly.   ?  ?  ?OBJECTIVE IMPAIRMENTS decreased activity tolerance, decreased mobility, decreased ROM, decreased strength, increased fascial restrictions, impaired flexibility, and pain.  ?  ?ACTIVITY LIMITATIONS community activity, shopping, and travel .  ?  ?PERSONAL FACTORS Past/current experiences and Time since onset of injury/illness/exacerbation are also affecting patient's functional outcome.  ?  ?  ?REHAB POTENTIAL: Good ?  ?CLINICAL DECISION MAKING: Stable/uncomplicated ?  ?EVALUATION COMPLEXITY: Low ?  ?  ?GOALS: ?Goals reviewed with patient? Yes ?  ?SHORT TERM GOALS: Target date: 09/25/2021 ?  ?The patient will demonstrate knowledge of basic self care strategies and compliant with basic home exercises ?Baseline:  ?Goal status: INITIAL ?  ?2.  The patient will report a 50% improvement in buttock, thigh pain in the AM with sit to stand and going up/down the stairs ?Baseline:  ?Goal status: INITIAL ?  ?3.  The patient will have improved lumbar flexion to 6 inches from toes and HS length to 50 deg bil needed for dressing, grooming tasks ?Baseline:  ?Goal status: INITIAL ?  ?  ?LONG TERM GOALS: Target date: 10/23/2021 ?  ?The patient will be independent with safe self progression of a HEP and return to the gym at [employer] for further improvement in function ?Baseline:  ?Goal status: INITIAL ?  ?2.  The patient will report a 75% improvement in bil buttock and thigh pain in the mornings and with transitional movements like sit to stand and  stairs ?Baseline:  ?Goal status: INITIAL ?  ?3.  The patient will have improved lumbo-pelvic-hip strength to grossly 4+/5 needed for lifting luggage and heavier grocery items ?Baseline:  ?Goal status: INITIA

## 2021-09-03 DIAGNOSIS — H25813 Combined forms of age-related cataract, bilateral: Secondary | ICD-10-CM | POA: Diagnosis not present

## 2021-09-03 DIAGNOSIS — H25811 Combined forms of age-related cataract, right eye: Secondary | ICD-10-CM | POA: Diagnosis not present

## 2021-09-06 ENCOUNTER — Ambulatory Visit: Payer: Medicare HMO | Admitting: Physical Therapy

## 2021-09-06 DIAGNOSIS — G8929 Other chronic pain: Secondary | ICD-10-CM | POA: Diagnosis not present

## 2021-09-06 DIAGNOSIS — M545 Low back pain, unspecified: Secondary | ICD-10-CM

## 2021-09-06 DIAGNOSIS — M6281 Muscle weakness (generalized): Secondary | ICD-10-CM

## 2021-09-06 NOTE — Therapy (Signed)
?OUTPATIENT PHYSICAL THERAPY TREATMENT NOTE ? ? ?Patient Name: Marcus Reynolds ?MRN: [id number] ?DOB:1945-10-19, 76 y.o., male ?Today's Date: 09/06/2021 ? ?PCP: Dorothyann Peng, NP ?REFERRING PROVIDER: Dorothyann Peng, NP ? ? PT End of Session - 09/06/21 1234   ? ? Visit Number 3   ? Date for PT Re-Evaluation 10/23/21   ? Authorization Type [employer] Medicare   ? PT Start Time 1234   ? PT Stop Time 1315   ? PT Time Calculation (min) 41 min   ? Activity Tolerance Patient tolerated treatment well   ? ?  ?  ? ?  ? ? ?Past Medical History:  ?Diagnosis Date  ? Hyperlipidemia   ? high tri  ? Personal history of colonic polyps 12/25/2010  ? ?Past Surgical History:  ?Procedure Laterality Date  ? COLONOSCOPY  12/25/2010  ? internal hemorrhoids  ? COLONOSCOPY W/ POLYPECTOMY  2007  ? [place], [place]  ? INGUINAL HERNIA REPAIR    ? right side  ? TONSILECTOMY, ADENOIDECTOMY, BILATERAL MYRINGOTOMY AND TUBES    ? ?Patient Active Problem List  ? Diagnosis Date Noted  ? Osteoarthritis of spine with radiculopathy, lumbar region 08/04/2017  ? Routine general medical examination at a health care facility 09/23/2013  ? ED (erectile dysfunction) 07/23/2011  ? History of colonic polyps 12/25/2010  ? HYPERTRIGLYCERIDEMIA 06/13/2009  ? ? ?REFERRING DIAG: back pain  ? ?THERAPY DIAG:  ?Chronic bilateral low back pain without sciatica ? ?Muscle weakness (generalized) ? ? ?SUBJECTIVE:  I'm guess I'm doing OK.  The ex's that help are the neural mob, leg raises and donkey kick, the hurt a little afterwards.  I do push myself and do more.  It activates the buttocks to 3-4/10.  I sit for 5 min with heat and then it subsides.  I can't do it first thing in the morning.  My right side is weaker.   ?PAIN:  ?Are you having pain? PAIN:  ?Are you having pain? Yes ?NPRS scale: 4/10 ?Pain location: backs of legs ?Pain orientation: Right and Left  ?PAIN TYPE: aching ? ?PCP: Dorothyann Peng, NP ?  ?REFERRING PROVIDER: Dorothyann Peng, NP ?  ?REFERRING DIAG: chronic midline low  back pain without sciatica ?  ?THERAPY DIAG:  ?Low back pain ?  ?ONSET DATE: 06/10/2021 ?  ?LIVING ENVIRONMENT: ?Lives with: lives with their spouse ?Lives in: House/apartment ?Stairs: Yes;  ?Has following equipment at home: None ?  ?OCCUPATION: retired  ?  ?PLOF: Independent ?  ?PATIENT GOALS go on trip to [place] in June ?  ?  ?OBJECTIVE:  ?  ?DIAGNOSTIC FINDINGS:  ?Arthritis in spine ?  ?PATIENT SURVEYS:  ?FOTO 53%  ?  ?SCREENING FOR RED FLAGS: ?Bowel or bladder incontinence: No ?Spinal tumors: No ?Cauda equina syndrome: No ?Compression fracture: No ?Abdominal aneurysm: No ?  ?COGNITION: ?          Overall cognitive status: Within functional limits for tasks assessed               ?           ?  ?  ?MUSCLE LENGTH: ?Hamstrings: Right 40 deg; Left 40 deg ?Decreased hip flexor lengths 0 degrees in sidelying ?POSTURE:  ?Dec lumbar lordosis ?  ?PALPATION: ?Tenderness in lumbar soft tissue and gluteals bil ?  ?LUMBAR ROM:   Prone lying uncomfortable ?  ?Active  A/PROM  ?08/28/2021  ?Flexion Fingertips to 12 in from toes   ?Extension 3 painful   ?Right lateral flexion 20  ?Left lateral  flexion 20  ?Right rotation    ?Left rotation    ? (Blank rows = not tested) ?  ?  ?LE MMT:  ?           Relies on UE assist with sit to stand; decreased lower abdominal and trunk extensor strength  ?MMT Right ?08/28/2021 Left ?08/28/2021  ?Hip flexion 4 4  ?Hip extension 4 4  ?Hip abduction 4- 4-  ?Hip adduction      ?Hip internal rotation      ?Hip external rotation      ?Knee flexion      ?Knee extension      ?Ankle dorsiflexion      ?Ankle plantarflexion      ?Ankle inversion      ?Ankle eversion      ? (Blank rows = not tested) ?  ?LUMBAR SPECIAL TESTS: right > left ?Straight leg raise test: Positive, Slump test: Positive, and Single leg stance test: Negative ?  ?GAIT: ?No device, dec hip extension noted ?  ?TODAY'S TREATMENT: ? ?09/06/21: ?Discussion of response to previous HEP with pain levels and recovery time. ?   Used spine model to  discuss stenosis and goal for spinal gapping and opening  ?   DKTC 30 sec hold 5x ?   Hang from lat bar on max weight ?   Table top decompression per HEP  ?      ?08/30/2021 ?Instruction on exercises to decrease overactivation of lumbar paraspinals: seated cat/cow with exhalation 10x ?Discussed quadruped or leaning on bed rocking or cat/cow in the AM for pain modulation ?Long sit on edge of bed HS stretch with ankle pump 10x right/left ?DKTC with LE dangle over side of bed 1 minute each side ?Standing hip abduction 10x right/left ?Standing bent over high table bent knee hip extension 2 sets of 5 each leg ? ? ?  ?  ?PATIENT EDUCATION:  ?Education details: Medbridge ?Person educated: Patient ?Education method: Explanation ?Education comprehension: verbalized understanding ?  ?  ?HOME EXERCISE PROGRAM:  ?Access Code: [email or web address] ?URL: [email or web address] ?Date: 09/06/2021 ?Prepared by: [name] ? ?Exercises ?- Seated Cat Cow  - 1 x daily - 7 x weekly - 1 sets - 10 reps ?- Seated Table Hamstring Stretch  - 1 x daily - 7 x weekly - 1 sets - 5 reps - 10 hold ?Marcello Moores Stretch on Table  - 1 x daily - 7 x weekly - 1 sets - 2 reps - 60 hold ?- Standing Hip Abduction  - 1 x daily - 7 x weekly - 1 sets - 15 reps ?- Standing Hip Extension  - 1 x daily - 7 x weekly - 3 sets - 10 reps ?- Supine Double Knee to Chest  - 1 x daily - 7 x weekly - 1 sets - 3 reps - 30 hold ?- Self Traction in Standing with Counter Top  - 1 x daily - 7 x weekly - 1 sets - 10 reps - 30 hold ? ? ?  ?ASSESSMENT: ?  ?CLINICAL IMPRESSION: ?Patient reports some post ex discomfort particularly with standing hip ex's but relieved with 5 min of sitting.  Discussed response to HEP and we determined that discomfort level was tolerable and able to be continued without modifications needed.  Added additional "opening" ex's for trial at home.  Therapist monitoring response and modifying treatment accordingly.   ?  ?  ?OBJECTIVE IMPAIRMENTS decreased  activity tolerance, decreased mobility, decreased ROM, decreased strength, increased  fascial restrictions, impaired flexibility, and pain.  ?  ?ACTIVITY LIMITATIONS community activity, shopping, and travel .  ?  ?PERSONAL FACTORS Past/current experiences and Time since onset of injury/illness/exacerbation are also affecting patient's functional outcome.  ?  ?  ?REHAB POTENTIAL: Good ?  ?CLINICAL DECISION MAKING: Stable/uncomplicated ?  ?EVALUATION COMPLEXITY: Low ?  ?  ?GOALS: ?Goals reviewed with patient? Yes ?  ?SHORT TERM GOALS: Target date: 09/25/2021 ?  ?The patient will demonstrate knowledge of basic self care strategies and compliant with basic home exercises ?Baseline:  ?Goal status: INITIAL ?  ?2.  The patient will report a 50% improvement in buttock, thigh pain in the AM with sit to stand and going up/down the stairs ?Baseline:  ?Goal status: INITIAL ?  ?3.  The patient will have improved lumbar flexion to 6 inches from toes and HS length to 50 deg bil needed for dressing, grooming tasks ?Baseline:  ?Goal status: INITIAL ?  ?  ?LONG TERM GOALS: Target date: 10/23/2021 ?  ?The patient will be independent with safe self progression of a HEP and return to the gym at [employer] [facility] [employer] for further improvement in function ?Baseline:  ?Goal status: INITIAL ?  ?2.  The patient will report a 75% improvement in bil buttock and thigh pain in the mornings and with transitional movements like sit to stand and stairs ?Baseline:  ?Goal status: INITIAL ?  ?3.  The patient will have improved lumbo-pelvic-hip strength to grossly 4+/5 needed for lifting luggage and heavier grocery items ?Baseline:  ?Goal status: INITIAL ?  ?4.  The patient will have improved lumbar and LE mobility and flexibility to [facility] needed for upcoming travel out of the country ?Baseline:  ?Goal status: INITIAL ?  ?5.  FOTO score improved from 53% to 66% ?Baseline:  ?Goal status: INITIAL ?  ?  ?  ?PLAN: ?PT FREQUENCY: 2x/week ?  ?PT DURATION: 8 weeks ?   ?PLANNED INTERVENTIONS: Therapeutic exercises, Therapeutic activity, Neuromuscular re-education, Balance training, Gait training, Patient/Family education, Joint mobilization, Aquatic Therapy, Dry Needling

## 2021-09-11 ENCOUNTER — Ambulatory Visit: Payer: Medicare HMO | Attending: Adult Health

## 2021-09-11 DIAGNOSIS — M6281 Muscle weakness (generalized): Secondary | ICD-10-CM | POA: Insufficient documentation

## 2021-09-11 DIAGNOSIS — M545 Low back pain, unspecified: Secondary | ICD-10-CM | POA: Insufficient documentation

## 2021-09-11 DIAGNOSIS — G8929 Other chronic pain: Secondary | ICD-10-CM | POA: Diagnosis not present

## 2021-09-11 NOTE — Therapy (Signed)
?OUTPATIENT PHYSICAL THERAPY TREATMENT NOTE ? ? ?Patient Name: Marcus Reynolds ?MRN: [id number] ?DOB:[birth year], 76 y.o., male ?Today's Date: 09/11/2021 ? ?PCP: Dorothyann Peng, NP ?REFERRING PROVIDER: Dorothyann Peng, NP ? ? PT End of Session - 09/11/21 1151   ? ? Visit Number 4   ? Date for PT Re-Evaluation 10/23/21   ? Authorization Type A[employer]na Medicare   ? PT Start Time 1145   ? PT Stop Time 1230   ? PT Time Calculation (min) 45 min   ? Activity Tolerance Patient tolerated treatment well   ? Behavior During Therapy [facility] for tasks assessed/performed   ? ?  ?  ? ?  ? ? ?Past Medical History:  ?Diagnosis Date  ? Hyperlipidemia   ? high tri  ? Personal history of colonic polyps 12/25/2010  ? ?Past Surgical History:  ?Procedure Laterality Date  ? COLONOSCOPY  12/25/2010  ? internal hemorrhoids  ? COLONOSCOPY W/ POLYPECTOMY  2007  ? [place], [place]  ? INGUINAL HERNIA REPAIR    ? right side  ? TONSILECTOMY, ADENOIDECTOMY, BILATERAL MYRINGOTOMY AND TUBES    ? ?Patient Active Problem List  ? Diagnosis Date Noted  ? Osteoarthritis of spine with radiculopathy, lumbar region 08/04/2017  ? Routine general medical examination at a health care facility 09/23/2013  ? ED (erectile dysfunction) 07/23/2011  ? History of colonic polyps 12/25/2010  ? HYPERTRIGLYCERIDEMIA 06/13/2009  ? ? ?REFERRING DIAG: back pain  ? ?THERAPY DIAG:  ?Chronic bilateral low back pain without sciatica ? ?Muscle weakness (generalized) ? ? ?SUBJECTIVE:  Patient arrives reporting a bad exacerbation of pain yesterday morning but feeling a little better today.  He reports his pain at 3-4/10.  He is still able to walk but feels he cannot do much more than this.   ?PAIN:  ?Are you having pain? PAIN:  ?Are you having pain? Yes ?NPRS scale: 3-4/10 ?Pain location: backs of legs ?Pain orientation: Right and Left  ?PAIN TYPE: aching ? ?PCP: Dorothyann Peng, NP ?  ?REFERRING PROVIDER: Dorothyann Peng, NP ?  ?REFERRING DIAG: chronic midline low back pain without sciatica ?  ?THERAPY  DIAG:  ?Low back pain ?  ?ONSET DATE: 06/10/2021 ?  ?LIVING ENVIRONMENT: ?Lives with: lives with their spouse ?Lives in: House/apartment ?Stairs: Yes;  ?Has following equipment at home: None ?  ?OCCUPATION: retired  ?  ?PLOF: Independent ?  ?PATIENT GOALS go on trip to [place] in June ?  ?  ?OBJECTIVE:  ?  ?DIAGNOSTIC FINDINGS:  ?Arthritis in spine ?  ?PATIENT SURVEYS:  ?FOTO 53%  ?  ?SCREENING FOR RED FLAGS: ?Bowel or bladder incontinence: No ?Spinal tumors: No ?Cauda equina syndrome: No ?Compression fracture: No ?Abdominal aneurysm: No ?  ?COGNITION: ?          Overall cognitive status: Within functional limits for tasks assessed               ?           ?  ?  ?MUSCLE LENGTH: ?Hamstrings: Right 40 deg; Left 40 deg ?Decreased hip flexor lengths 0 degrees in sidelying ?POSTURE:  ?Dec lumbar lordosis ?  ?PALPATION: ?Tenderness in lumbar soft tissue and gluteals bil ?  ?LUMBAR ROM:   Prone lying uncomfortable ?  ?Active  A/PROM  ?08/28/2021  ?Flexion Fingertips to 12 in from toes   ?Extension 3 painful   ?Right lateral flexion 20  ?Left lateral flexion 20  ?Right rotation    ?Left rotation    ? (Blank rows = not  tested) ?  ?  ?LE MMT:  ?           Relies on UE assist with sit to stand; decreased lower abdominal and trunk extensor strength  ?MMT Right ?08/28/2021 Left ?08/28/2021  ?Hip flexion 4 4  ?Hip extension 4 4  ?Hip abduction 4- 4-  ?Hip adduction      ?Hip internal rotation      ?Hip external rotation      ?Knee flexion      ?Knee extension      ?Ankle dorsiflexion      ?Ankle plantarflexion      ?Ankle inversion      ?Ankle eversion      ? (Blank rows = not tested) ?  ?LUMBAR SPECIAL TESTS: right > left ?Straight leg raise test: Positive, Slump test: Positive, and Single leg stance test: Negative ?  ?GAIT: ?No device, dec hip extension noted ?  ?TODAY'S TREATMENT: ? ?09/11/21: ?NuStep x 5 min to improve blood flow and oxygen to tissues while discussing progress ? Standing hamstring stretch 3 x 30 sec ? Standing  quad/hip flexor stretch 3 x 30 sec ? Supine piriformis stretch ? Pelvic Tilt x 20 ? Pelvic Tilt with dying bug x 20 ?Lengthy amount of time spent on educating on anatomy of the spine and positioning for decompression along with importance of flexibility.  Also suggested using ice for pain relief in lieu of the heat to see if this controlled his pain better.   ?Ice pack to lumbar spine following treatment x 10 min in hooklying with feet propped on bolster.    ?09/06/21: ?Discussion of response to previous HEP with pain levels and recovery time. ?   Used spine model to discuss stenosis and goal for spinal gapping and opening  ?   DKTC 30 sec hold 5x ?   Hang from lat bar on max weight ?   Table top decompression per HEP  ?      ?08/30/2021 ?Instruction on exercises to decrease overactivation of lumbar paraspinals: seated cat/cow with exhalation 10x ?Discussed quadruped or leaning on bed rocking or cat/cow in the AM for pain modulation ?Long sit on edge of bed HS stretch with ankle pump 10x right/left ?DKTC with LE dangle over side of bed 1 minute each side ?Standing hip abduction 10x right/left ?Standing bent over high table bent knee hip extension 2 sets of 5 each leg ? ? ?  ?  ?PATIENT EDUCATION:  ?Education details: Medbridge ?Person educated: Patient ?Education method: Explanation ?Education comprehension: verbalized understanding ?  ?  ?HOME EXERCISE PROGRAM:  ?Access Code: [email or web address] ?URL: [email or web address] ?Date: 09/06/2021 ?Prepared by: [name] ? ?Exercises ?- Seated Cat Cow  - 1 x daily - 7 x weekly - 1 sets - 10 reps ?- Seated Table Hamstring Stretch  - 1 x daily - 7 x weekly - 1 sets - 5 reps - 10 hold ?Marcello Moores Stretch on Table  - 1 x daily - 7 x weekly - 1 sets - 2 reps - 60 hold ?- Standing Hip Abduction  - 1 x daily - 7 x weekly - 1 sets - 15 reps ?- Standing Hip Extension  - 1 x daily - 7 x weekly - 3 sets - 10 reps ?- Supine Double Knee to Chest  - 1 x daily - 7 x weekly - 1 sets -  3 reps - 30 hold ?- Self Traction in Standing with Counter Top  - 1 x daily -  7 x weekly - 1 sets - 10 reps - 30 hold ? ? ?  ?ASSESSMENT: ?  ?CLINICAL IMPRESSION: ?Patient was able to complete all tasks without increase in pain.  He has significant hamstring tightness and poor core strength.  We will progress cautiously to insure no exacerbations of radicular symptoms and leg weakness.    He would benefit from continued skilled PT to address his limited flexibility and core strength deficits.   ?  ?  ?OBJECTIVE IMPAIRMENTS decreased activity tolerance, decreased mobility, decreased ROM, decreased strength, increased fascial restrictions, impaired flexibility, and pain.  ?  ?ACTIVITY LIMITATIONS community activity, shopping, and travel .  ?  ?PERSONAL FACTORS Past/current experiences and Time since onset of injury/illness/exacerbation are also affecting patient's functional outcome.  ?  ?  ?REHAB POTENTIAL: Good ?  ?CLINICAL DECISION MAKING: Stable/uncomplicated ?  ?EVALUATION COMPLEXITY: Low ?  ?  ?GOALS: ?Goals reviewed with patient? Yes ?  ?SHORT TERM GOALS: Target date: 09/25/2021 ?  ?The patient will demonstrate knowledge of basic self care strategies and compliant with basic home exercises ?Baseline:  ?Goal status: INITIAL ?  ?2.  The patient will report a 50% improvement in buttock, thigh pain in the AM with sit to stand and going up/down the stairs ?Baseline:  ?Goal status: INITIAL ?  ?3.  The patient will have improved lumbar flexion to 6 inches from toes and HS length to 50 deg bil needed for dressing, grooming tasks ?Baseline:  ?Goal status: INITIAL ?  ?  ?LONG TERM GOALS: Target date: 10/23/2021 ?  ?The patient will be independent with safe self progression of a HEP and return to the gym at [employer] for further improvement in function ?Baseline:  ?Goal status: INITIAL ?  ?2.  The patient will report a 75% improvement in bil buttock and thigh pain in the mornings and with transitional movements like  sit to stand and stairs ?Baseline:  ?Goal status: INITIAL ?  ?3.  The patient will have improved lumbo-pelvic-hip strength to grossly 4+/5 needed for lifting luggage and heavier grocery items ?Baseline:  ?G

## 2021-09-13 ENCOUNTER — Ambulatory Visit: Payer: Medicare HMO | Admitting: Physical Therapy

## 2021-09-13 DIAGNOSIS — M6281 Muscle weakness (generalized): Secondary | ICD-10-CM

## 2021-09-13 DIAGNOSIS — M545 Low back pain, unspecified: Secondary | ICD-10-CM | POA: Diagnosis not present

## 2021-09-13 DIAGNOSIS — G8929 Other chronic pain: Secondary | ICD-10-CM

## 2021-09-13 NOTE — Therapy (Signed)
?OUTPATIENT PHYSICAL THERAPY TREATMENT NOTE ? ? ?Patient Name: Marcus Reynolds ?MRN: [id number] ?DOB:13-Jan-1946, 76 y.o., male ?Today's Date: 09/13/2021 ? ?PCP: Dorothyann Peng, NP ?REFERRING PROVIDER: Dorothyann Peng, NP ? ? PT End of Session - 09/13/21 1237   ? ? Visit Number 5   ? Date for PT Re-Evaluation 10/23/21   ? Authorization Type [employer]   ? PT Start Time 1233   ? PT Stop Time 1320  ? PT Time Calculation (min) 46 min   ? Activity Tolerance Patient tolerated treatment well   ? ?  ?  ? ?  ? ? ?Past Medical History:  ?Diagnosis Date  ? Hyperlipidemia   ? high tri  ? Personal history of colonic polyps 12/25/2010  ? ?Past Surgical History:  ?Procedure Laterality Date  ? COLONOSCOPY  12/25/2010  ? internal hemorrhoids  ? COLONOSCOPY W/ POLYPECTOMY  2007  ? [place], [place]  ? INGUINAL HERNIA REPAIR    ? right side  ? TONSILECTOMY, ADENOIDECTOMY, BILATERAL MYRINGOTOMY AND TUBES    ? ?Patient Active Problem List  ? Diagnosis Date Noted  ? Osteoarthritis of spine with radiculopathy, lumbar region 08/04/2017  ? Routine general medical examination at a health care facility 09/23/2013  ? ED (erectile dysfunction) 07/23/2011  ? History of colonic polyps 12/25/2010  ? HYPERTRIGLYCERIDEMIA 06/13/2009  ? ? ?REFERRING DIAG: back pain  ? ?THERAPY DIAG:  ?Chronic bilateral low back pain without sciatica ? ?Muscle weakness (generalized) ? ? ?SUBJECTIVE:  [place]y after doing my ex's at 10 today.  I have to sit down gingerly.  Felt good after ice on Tuesday.  Still doing my walks.   ?PAIN:  ?Are you having pain? PAIN:  ?Are you having pain? Yes ?NPRS scale: 1-7/61 only hurts in certain positions, not constant ?Pain location: backs of legs ?Pain orientation: Right and Left  ?PAIN TYPE: aching ? ?PCP: Dorothyann Peng, NP ?  ?REFERRING PROVIDER: Dorothyann Peng, NP ?  ?REFERRING DIAG: chronic midline low back pain without sciatica ?  ?THERAPY DIAG:  ?Low back pain ?  ?ONSET DATE: 06/10/2021 ?  ?LIVING ENVIRONMENT: ?Lives with: lives with their  spouse ?Lives in: House/apartment ?Stairs: Yes;  ?Has following equipment at home: None ?  ?OCCUPATION: retired  ?  ?PLOF: Independent ?  ?PATIENT GOALS go on trip to [place] in June ?  ?  ?OBJECTIVE:  ?  ?DIAGNOSTIC FINDINGS:  ?Arthritis in spine ?  ?PATIENT SURVEYS:  ?FOTO 53%  ?  ?SCREENING FOR RED FLAGS: ?Bowel or bladder incontinence: No ?Spinal tumors: No ?Cauda equina syndrome: No ?Compression fracture: No ?Abdominal aneurysm: No ?  ?COGNITION: ?          Overall cognitive status: Within functional limits for tasks assessed               ?           ?  ?  ?MUSCLE LENGTH: ?Hamstrings: Right 40 deg; Left 40 deg ?Decreased hip flexor lengths 0 degrees in sidelying ?POSTURE:  ?Dec lumbar lordosis ?  ?PALPATION: ?Tenderness in lumbar soft tissue and gluteals bil ?  ?LUMBAR ROM:   Prone lying uncomfortable ?  ?Active  A/PROM  ?08/28/2021  ?Flexion Fingertips to 12 in from toes   ?Extension 3 painful   ?Right lateral flexion 20  ?Left lateral flexion 20  ?Right rotation    ?Left rotation    ? (Blank rows = not tested) ?  ?  ?LE MMT:  ?  Relies on UE assist with sit to stand; decreased lower abdominal and trunk extensor strength  ?MMT Right ?08/28/2021 Left ?08/28/2021  ?Hip flexion 4 4  ?Hip extension 4 4  ?Hip abduction 4- 4-  ?Hip adduction      ?Hip internal rotation      ?Hip external rotation      ?Knee flexion      ?Knee extension      ?Ankle dorsiflexion      ?Ankle plantarflexion      ?Ankle inversion      ?Ankle eversion      ? (Blank rows = not tested) ?  ?LUMBAR SPECIAL TESTS: right > left ?Straight leg raise test: Positive, Slump test: Positive, and Single leg stance test: Negative ?  ?GAIT: ?No device, dec hip extension noted ?  ?TODAY'S TREATMENT: ?09/13/21: ?SKTC 3x right/left 10 sec holds ? Standing hamstring stretch 3 x 30 sec right/left  ? Seated blue ball roll out without and with bias 3x each way; ? Pelvic Tilt x 10 5 sec holds  ? Pelvic Tilt with dying bug x 20; ? Standing red band bil  shoulder extension in staggered stance and with march 5x each; ? Standing red band staggered stance pallof press 5x to each side ? Plank on high table elbow taps 6x; ?Lengthy amount of time spent on educating on pain and activity  ?Ice pack to lumbar spine in hooklying/bolster 5 min ?09/11/21: ?NuStep x 5 min to improve blood flow and oxygen to tissues while discussing progress ? Standing hamstring stretch 3 x 30 sec ? Standing quad/hip flexor stretch 3 x 30 sec ? Supine piriformis stretch ? Pelvic Tilt x 20 ? Pelvic Tilt with dying bug x 20 ?Lengthy amount of time spent on educating on anatomy of the spine and positioning for decompression along with importance of flexibility.  Also suggested using ice for pain relief in lieu of the heat to see if this controlled his pain better.   ?Ice pack to lumbar spine following treatment x 10 min in hooklying with feet propped on bolster.    ?09/06/21: ?Discussion of response to previous HEP with pain levels and recovery time. ?   Used spine model to discuss stenosis and goal for spinal gapping and opening  ?   DKTC 30 sec hold 5x ?   Hang from lat bar on max weight ?   Table top decompression per HEP  ?      ?08/30/2021 ?Instruction on exercises to decrease overactivation of lumbar paraspinals: seated cat/cow with exhalation 10x ?Discussed quadruped or leaning on bed rocking or cat/cow in the AM for pain modulation ?Long sit on edge of bed HS stretch with ankle pump 10x right/left ?DKTC with LE dangle over side of bed 1 minute each side ?Standing hip abduction 10x right/left ?Standing bent over high table bent knee hip extension 2 sets of 5 each leg ? ? ?  ?  ?PATIENT EDUCATION:  ?Education details: [employer] ?Person educated: Patient ?Education method: Explanation ?Education comprehension: verbalized understanding ?  ?  ?HOME EXERCISE PROGRAM:  ?Access Code: [email or web address] ?URL: [email or web address] ?Date: 09/06/2021 ?Prepared by: [name] ? ?Exercises ?- Seated  Cat Cow  - 1 x daily - 7 x weekly - 1 sets - 10 reps ?- Seated Table Hamstring Stretch  - 1 x daily - 7 x weekly - 1 sets - 5 reps - 10 hold ?Marcello Moores Stretch on Table  - 1 x daily - 7 x weekly -  1 sets - 2 reps - 60 hold ?- Standing Hip Abduction  - 1 x daily - 7 x weekly - 1 sets - 15 reps ?- Standing Hip Extension  - 1 x daily - 7 x weekly - 3 sets - 10 reps ?- Supine Double Knee to Chest  - 1 x daily - 7 x weekly - 1 sets - 3 reps - 30 hold ?- Self Traction in Standing with Counter Top  - 1 x daily - 7 x weekly - 1 sets - 10 reps - 30 hold ? ? ?  ?ASSESSMENT: ?  ?CLINICAL IMPRESSION:  The patient reports increased achiness on arrival and following sesssion today but well-controlled with ice pack.  Discussed expected pain levels and the need for modifying exs if pain level is >5/10.  He reports his pain level is 3-4/10.  Continue with graded exposure to exercise.   ? ?  ?  ?OBJECTIVE IMPAIRMENTS decreased activity tolerance, decreased mobility, decreased ROM, decreased strength, increased fascial restrictions, impaired flexibility, and pain.  ?  ?ACTIVITY LIMITATIONS community activity, shopping, and travel .  ?  ?PERSONAL FACTORS Past/current experiences and Time since onset of injury/illness/exacerbation are also affecting patient's functional outcome.  ?  ?  ?REHAB POTENTIAL: Good ?  ?CLINICAL DECISION MAKING: Stable/uncomplicated ?  ?EVALUATION COMPLEXITY: Low ?  ?  ?GOALS: ?Goals reviewed with patient? Yes ?  ?SHORT TERM GOALS: Target date: 09/25/2021 ?  ?The patient will demonstrate knowledge of basic self care strategies and compliant with basic home exercises ?Baseline:  ?Goal status: INITIAL ?  ?2.  The patient will report a 50% improvement in buttock, thigh pain in the AM with sit to stand and going up/down the stairs ?Baseline:  ?Goal status: INITIAL ?  ?3.  The patient will have improved lumbar flexion to 6 inches from toes and HS length to 50 deg bil needed for dressing, grooming tasks ?Baseline:   ?Goal status: INITIAL ?  ?  ?LONG TERM GOALS: Target date: 10/23/2021 ?  ?The patient will be independent with safe self progression of a HEP and return to the gym at [facility] for further improvement in

## 2021-09-18 ENCOUNTER — Encounter: Payer: Self-pay | Admitting: Adult Health

## 2021-09-18 DIAGNOSIS — G8929 Other chronic pain: Secondary | ICD-10-CM

## 2021-09-18 NOTE — Addendum Note (Signed)
Addended by: Elza Rafter D on: 09/18/2021 12:13 PM ? ? Modules accepted: Orders ? ?

## 2021-09-20 ENCOUNTER — Ambulatory Visit: Payer: Medicare HMO | Admitting: Physical Therapy

## 2021-09-20 DIAGNOSIS — G8929 Other chronic pain: Secondary | ICD-10-CM

## 2021-09-20 DIAGNOSIS — M6281 Muscle weakness (generalized): Secondary | ICD-10-CM

## 2021-09-20 DIAGNOSIS — M545 Low back pain, unspecified: Secondary | ICD-10-CM | POA: Diagnosis not present

## 2021-09-20 NOTE — Therapy (Signed)
?OUTPATIENT PHYSICAL THERAPY TREATMENT NOTE ? ? ?Patient Name: Marcus Reynolds ?MRN: [id number] ?DOB:09-Mar-1946, 76 y.o., male ?Today's Date: 09/20/2021 ? ?PCP: Dorothyann Peng, NP ?REFERRING PROVIDER: Dorothyann Peng, NP ? ? PT End of Session - 09/13/21 1237   ? ? Visit Number 6  ? Date for PT Re-Evaluation 10/23/21   ? Authorization Type Aetna Medicare   ? PT Start Time 1230   ? PT Stop Time 1315  ? PT Time Calculation (min) 45 min   ? Activity Tolerance Patient tolerated treatment well   ? ?  ?  ? ?  ? ? ?Past Medical History:  ?Diagnosis Date  ? Hyperlipidemia   ? high tri  ? Personal history of colonic polyps 12/25/2010  ? ?Past Surgical History:  ?Procedure Laterality Date  ? COLONOSCOPY  12/25/2010  ? internal hemorrhoids  ? COLONOSCOPY W/ POLYPECTOMY  2007  ? [place], [place]  ? INGUINAL HERNIA REPAIR    ? right side  ? TONSILECTOMY, ADENOIDECTOMY, BILATERAL MYRINGOTOMY AND TUBES    ? ?Patient Active Problem List  ? Diagnosis Date Noted  ? Osteoarthritis of spine with radiculopathy, lumbar region 08/04/2017  ? Routine general medical examination at a health care facility 09/23/2013  ? ED (erectile dysfunction) 07/23/2011  ? History of colonic polyps 12/25/2010  ? HYPERTRIGLYCERIDEMIA 06/13/2009  ? ? ?REFERRING DIAG: back pain  ? ?THERAPY DIAG:  ?Chronic bilateral low back pain without sciatica ? ?Muscle weakness (generalized) ? ? ?SUBJECTIVE:  I'm not doing well today.  On Saturday I looked up and that triggered something.  A little better the next few days but today I can hardly get out of the car.  Everything feels locked up.  Patient declines sitting down while discussing status.   ?PAIN:  ?Are you having pain? PAIN:  ?Are you having pain? Yes ?NPRS scale: 8/10  ?Pain location: back and gluteals ?Pain orientation: Right and Left  ?PAIN TYPE: aching ? ?PCP: Dorothyann Peng, NP ?  ?REFERRING PROVIDER: Dorothyann Peng, NP ?  ?REFERRING DIAG: chronic midline low back pain without sciatica ?  ?THERAPY DIAG:  ?Low back pain ?   ?ONSET DATE: 06/10/2021 ?  ?LIVING ENVIRONMENT: ?Lives with: lives with their spouse ?Lives in: House/apartment ?Stairs: Yes;  ?Has following equipment at home: None ?  ?OCCUPATION: retired  ?  ?PLOF: Independent ?  ?PATIENT GOALS go on trip to [place] in June ?  ?  ?OBJECTIVE:  ?  ?DIAGNOSTIC FINDINGS:  ?Arthritis in spine ?  ?PATIENT SURVEYS:  ?FOTO 53%  ?  ?SCREENING FOR RED FLAGS: ?Bowel or bladder incontinence: No ?Spinal tumors: No ?Cauda equina syndrome: No ?Compression fracture: No ?Abdominal aneurysm: No ?  ?COGNITION: ?          Overall cognitive status: Within functional limits for tasks assessed               ?           ?  ?  ?MUSCLE LENGTH: ?Hamstrings: Right 40 deg; Left 40 deg ?Decreased hip flexor lengths 0 degrees in sidelying ?POSTURE:  ?Dec lumbar lordosis ?  ?PALPATION: ?Tenderness in lumbar soft tissue and gluteals bil ?  ?LUMBAR ROM:   Prone lying uncomfortable ?  ?Active  A/PROM  ?08/28/2021  ?Flexion Fingertips to 12 in from toes   ?Extension 3 painful   ?Right lateral flexion 20  ?Left lateral flexion 20  ?Right rotation    ?Left rotation    ? (Blank rows = not tested) ?  ?  ?  LE MMT:  ?           Relies on UE assist with sit to stand; decreased lower abdominal and trunk extensor strength  ?MMT Right ?08/28/2021 Left ?08/28/2021  ?Hip flexion 4 4  ?Hip extension 4 4  ?Hip abduction 4- 4-  ?Hip adduction      ?Hip internal rotation      ?Hip external rotation      ?Knee flexion      ?Knee extension      ?Ankle dorsiflexion      ?Ankle plantarflexion      ?Ankle inversion      ?Ankle eversion      ? (Blank rows = not tested) ?  ?LUMBAR SPECIAL TESTS: right > left ?Straight leg raise test: Positive, Slump test: Positive, and Single leg stance test: Negative ?  ?GAIT: ?No device, dec hip extension noted ?  ?TODAY'S TREATMENT: ?09/20/21: ?Supine with large bolster under knees with moist heat and ES to bil low back and upper gluteals 3m x 15 min for pain reduction ?Discussed "box breathing" as a tool  to calm oversensitive central nervous system ?Handout "Take your MEDS" strategies to calm CNS ? ? ?09/13/21: ?SKTC 3x right/left 10 sec holds ? Standing hamstring stretch 3 x 30 sec right/left  ? Seated blue ball roll out without and with bias 3x each way; ? Pelvic Tilt x 10 5 sec holds  ? Pelvic Tilt with dying bug x 20; ? Standing red band bil shoulder extension in staggered stance and with march 5x each; ? Standing red band staggered stance pallof press 5x to each side ? Plank on high table elbow taps 6x; ?Lengthy amount of time spent on educating on pain and activity  ?Ice pack to lumbar spine in hooklying/bolster 5 min ?09/11/21: ?NuStep x 5 min to improve blood flow and oxygen to tissues while discussing progress ? Standing hamstring stretch 3 x 30 sec ? Standing quad/hip flexor stretch 3 x 30 sec ? Supine piriformis stretch ? Pelvic Tilt x 20 ? Pelvic Tilt with dying bug x 20 ?Lengthy amount of time spent on educating on anatomy of the spine and positioning for decompression along with importance of flexibility.  Also suggested using ice for pain relief in lieu of the heat to see if this controlled his pain better.   ?Ice pack to lumbar spine following treatment x 10 min in hooklying with feet propped on bolster.    ?09/06/21: ?Discussion of response to previous HEP with pain levels and recovery time. ?   Used spine model to discuss stenosis and goal for spinal gapping and opening  ?   DKTC 30 sec hold 5x ?   Hang from lat bar on max weight ?   Table top decompression per HEP  ?      ?08/30/2021 ?Instruction on exercises to decrease overactivation of lumbar paraspinals: seated cat/cow with exhalation 10x ?Discussed quadruped or leaning on bed rocking or cat/cow in the AM for pain modulation ?Long sit on edge of bed HS stretch with ankle pump 10x right/left ?DKTC with LE dangle over side of bed 1 minute each side ?Standing hip abduction 10x right/left ?Standing bent over high table bent knee hip extension 2 sets of  5 each leg ? ? ?  ?  ?PATIENT EDUCATION:  ?Education details: Handout to calm CNS ?Person educated: Patient ?Education method: Explanation ?Education comprehension: verbalized understanding ?  ?  ?HOME EXERCISE PROGRAM:  ?Access Code: [email or web address] ?URL: [email or web address]  Date: 09/06/2021 ?Prepared by: [name] ? ?Exercises ?- Seated Cat Cow  - 1 x daily - 7 x weekly - 1 sets - 10 reps ?- Seated Table Hamstring Stretch  - 1 x daily - 7 x weekly - 1 sets - 5 reps - 10 hold ?Marcello Moores Stretch on Table  - 1 x daily - 7 x weekly - 1 sets - 2 reps - 60 hold ?- Standing Hip Abduction  - 1 x daily - 7 x weekly - 1 sets - 15 reps ?- Standing Hip Extension  - 1 x daily - 7 x weekly - 3 sets - 10 reps ?- Supine Double Knee to Chest  - 1 x daily - 7 x weekly - 1 sets - 3 reps - 30 hold ?- Self Traction in Standing with Counter Top  - 1 x daily - 7 x weekly - 1 sets - 10 reps - 30 hold ? ? ?  ?ASSESSMENT: ?  ?CLINICAL IMPRESSION:  The patient arrives with reports of high levels of pain and stiffness in his back and upper gluteals.  Treatment focus on strategies to calming an oversensitive nervous system and given handout to discuss strategies.  Good response to ES and heat with reduced pain post session with patient stating, "I am walking better." ? ?  ?  ?OBJECTIVE IMPAIRMENTS decreased activity tolerance, decreased mobility, decreased ROM, decreased strength, increased fascial restrictions, impaired flexibility, and pain.  ?  ?ACTIVITY LIMITATIONS community activity, shopping, and travel .  ?  ?PERSONAL FACTORS Past/current experiences and Time since onset of injury/illness/exacerbation are also affecting patient's functional outcome.  ?  ?  ?REHAB POTENTIAL: Good ?  ?CLINICAL DECISION MAKING: Stable/uncomplicated ?  ?EVALUATION COMPLEXITY: Low ?  ?  ?GOALS: ?Goals reviewed with patient? Yes ?  ?SHORT TERM GOALS: Target date: 09/25/2021 ?  ?The patient will demonstrate knowledge of basic self care strategies  and compliant with basic home exercises ?Baseline:  ?Goal status: INITIAL ?  ?2.  The patient will report a 50% improvement in buttock, thigh pain in the AM with sit to stand and going up/down the sta

## 2021-09-20 NOTE — Patient Instructions (Addendum)
"  TAKE YOUR MEDS"  ? ?Acronym to reduce the body's inflammatory response ? ?1) MEDITATION: Mindfulness and meditation have been proven to reduce the brain's over-sensitization and stimulation ? ? ?2) EXERCISE: Activity pumps the muscles which in return washes those inflammatory cells away ? ? ?3)DIET: Eat healthy foods especially plants. Stay away from processed foods, especially sugar which has been proven to INCREASE pain levels! ? ? ?4)SLEEP: Prioritize sleep right now.  Avoid screens for the 30 minutes before bedtime.  A cool, dark room is best for sleeping.  Your body needs quality sleep for healing.   ? ?TENS stands for Transcutaneous Electrical Nerve Stimulation. In other words, electrical impulses are allowed to pass through the skin in order to excite a nerve.  ? ?Purpose and Use of TENS:  ?TENS is a method used to manage acute and chronic pain without the use of drugs. It has been effective in managing pain associated with surgery, sprains, strains, trauma, rheumatoid arthritis, and neuralgias. It is a non-addictive, low risk, and non-invasive technique used to control pain. It is not, by any means, a curative form of treatment.  ? ?How TENS Works:  ?Most TENS units are a small pocket-sized unit powered by one 9 volt battery. Attached to the outside of the unit are two lead wires where two pins and/or snaps connect on each wire. All units come with a set of four reusable pads or electrodes. These are placed on the skin surrounding the area involved. By inserting the leads into  the pads, the electricity can pass from the unit making the circuit complete.  ?As the intensity is turned up slowly, the electrical current enters the body from the electrodes through the skin to the surrounding nerve fibers. This triggers the release of hormones from within the body. These hormones contain pain relievers. By increasing the  circulation of these hormones, the person?s pain may be lessened. It is also believed that the electrical stimulation itself helps to block the pain messages being sent to the brain, thus also decreasing the body?s perception of pain.  ? ?Hazards:  ?TENS units are NOT to be used by patients with PACEMAKERS, DEFIBRILLATORS, DIABETIC PUMPS, PREGNANT WOMEN, and patients with SEIZURE DISORDERS.  ?TENS units are NOT to be used over the heart, throat, brain, or spinal cord.  ?One of the major side effects from the TENS unit may be skin irritation. Some people may develop a rash if they are sensitive to the materials used in the electrodes or the connecting wires.  ? ? ? ?Avoid overuse due the body getting used to the stem making it not as effective over time.  ?  ?

## 2021-09-25 ENCOUNTER — Ambulatory Visit: Payer: Medicare HMO | Admitting: Physical Therapy

## 2021-09-25 DIAGNOSIS — G8929 Other chronic pain: Secondary | ICD-10-CM

## 2021-09-25 DIAGNOSIS — M6281 Muscle weakness (generalized): Secondary | ICD-10-CM

## 2021-09-25 DIAGNOSIS — M545 Low back pain, unspecified: Secondary | ICD-10-CM | POA: Diagnosis not present

## 2021-09-25 NOTE — Therapy (Signed)
?OUTPATIENT PHYSICAL THERAPY TREATMENT NOTE ? ? ?Patient Name: Marcus Reynolds ?MRN: [id number] ?DOB:1946/03/20, 76 y.o., male ?Today's Date: 09/25/2021 ? ?PCP: Dorothyann Peng, NP ?REFERRING PROVIDER: Dorothyann Peng, NP ? ? PT End of Session - 09/13/21 1237   ? ? Visit Number 6  ? Date for PT Re-Evaluation 10/23/21   ? Authorization Type [employer]   ? PT Start Time 1230   ? PT Stop Time 1315  ? PT Time Calculation (min) 45 min   ? Activity Tolerance Patient tolerated treatment well   ? ?  ?  ? ?  ? ? ?Past Medical History:  ?Diagnosis Date  ? Hyperlipidemia   ? high tri  ? Personal history of colonic polyps 12/25/2010  ? ?Past Surgical History:  ?Procedure Laterality Date  ? COLONOSCOPY  12/25/2010  ? internal hemorrhoids  ? COLONOSCOPY W/ POLYPECTOMY  2007  ? [place], [place]  ? INGUINAL HERNIA REPAIR    ? right side  ? TONSILECTOMY, ADENOIDECTOMY, BILATERAL MYRINGOTOMY AND TUBES    ? ?Patient Active Problem List  ? Diagnosis Date Noted  ? Osteoarthritis of spine with radiculopathy, lumbar region 08/04/2017  ? Routine general medical examination at a health care facility 09/23/2013  ? ED (erectile dysfunction) 07/23/2011  ? History of colonic polyps 12/25/2010  ? HYPERTRIGLYCERIDEMIA 06/13/2009  ? ? ?REFERRING DIAG: back pain  ? ?THERAPY DIAG:  ?Chronic bilateral low back pain without sciatica ? ?Muscle weakness (generalized) ? ? ?SUBJECTIVE:  I'm much better than where I was.  Able to get my walk in 3 miles.  Good days and bad days.  Nothing is consistent yet.  I'm 2 months out from my trip.  I'm going to see Dr. Ernestina Patches tomorrow.  (Saw him in the past for [facility]).  Cataract surgery in 2 weeks.  Doing SKTC, DKTC, Figure 4, sidelying leg lifts I can do these without hurting.   ?PAIN:  ?Are you having pain? PAIN:  ?Are you having pain? Yes ?NPRS scale: 3/10 "that's manageable for me" ?Pain location: back and gluteals ?Pain orientation: Right and Left  ?PAIN TYPE: aching ? ?PCP: Dorothyann Peng, NP ?  ?REFERRING PROVIDER:  Dorothyann Peng, NP ?  ?REFERRING DIAG: chronic midline low back pain without sciatica ?  ?THERAPY DIAG:  ?Low back pain ?  ?ONSET DATE: 06/10/2021 ?  ?LIVING ENVIRONMENT: ?Lives with: lives with their spouse ?Lives in: House/apartment ?Stairs: Yes;  ?Has following equipment at home: None ?  ?OCCUPATION: retired  ?  ?PLOF: Independent ?  ?PATIENT GOALS go on trip to [place] in June ?  ?  ?OBJECTIVE:  ?  ?DIAGNOSTIC FINDINGS:  ?Arthritis in spine ?  ?PATIENT SURVEYS:  ?FOTO 53%  ?  ?SCREENING FOR RED FLAGS: ?Bowel or bladder incontinence: No ?Spinal tumors: No ?Cauda equina syndrome: No ?Compression fracture: No ?Abdominal aneurysm: No ?  ?COGNITION: ?          Overall cognitive status: Within functional limits for tasks assessed               ?           ?  ?  ?MUSCLE LENGTH: ?Hamstrings: Right 40 deg; Left 40 deg ?Decreased hip flexor lengths 0 degrees in sidelying ?POSTURE:  ?Dec lumbar lordosis ?  ?PALPATION: ?Tenderness in lumbar soft tissue and gluteals bil ?  ?LUMBAR ROM:   Prone lying uncomfortable ?  ?Active  A/PROM  ?08/28/2021  ?Flexion Fingertips to 12 in from toes   ?Extension 3 painful   ?  Right lateral flexion 20  ?Left lateral flexion 20  ?Right rotation    ?Left rotation    ? (Blank rows = not tested) ?  ?  ?LE MMT:  ?           Relies on UE assist with sit to stand; decreased lower abdominal and trunk extensor strength  ?MMT Right ?08/28/2021 Left ?08/28/2021  ?Hip flexion 4 4  ?Hip extension 4 4  ?Hip abduction 4- 4-  ?Hip adduction      ?Hip internal rotation      ?Hip external rotation      ?Knee flexion      ?Knee extension      ?Ankle dorsiflexion      ?Ankle plantarflexion      ?Ankle inversion      ?Ankle eversion      ? (Blank rows = not tested) ?  ?LUMBAR SPECIAL TESTS: right > left ?Straight leg raise test: Positive, Slump test: Positive, and Single leg stance test: Negative ?  ?GAIT: ?No device, dec hip extension noted ?  ?TODAY'S TREATMENT: ?09/25/21: ?Physiology of healing and rate of recovery  "stone cutter analogy"  ?Discussion of back supports for upcoming trip ?At the steps: HS stretch, hip flexor stretch with UE reach on each side 10x  ?Partial squat at the steps 3x ?Green band above the knees hip extension and hip abduction 10x each side leaning forward with hands on counter ? ? ? ?09/20/21: ?Supine with large bolster under knees with moist heat and ES to bil low back and upper gluteals 60m x 15 min for pain reduction ?Discussed "box breathing" as a tool to calm oversensitive central nervous system ?Handout "Take your MEDS" strategies to calm CNS ? ? ?09/13/21: ?SKTC 3x right/left 10 sec holds ? Standing hamstring stretch 3 x 30 sec right/left  ? Seated blue ball roll out without and with bias 3x each way; ? Pelvic Tilt x 10 5 sec holds  ? Pelvic Tilt with dying bug x 20; ? Standing red band bil shoulder extension in staggered stance and with march 5x each; ? Standing red band staggered stance pallof press 5x to each side ? Plank on high table elbow taps 6x; ?Lengthy amount of time spent on educating on pain and activity  ?Ice pack to lumbar spine in hooklying/bolster 5 min ?09/11/21: ?NuStep x 5 min to improve blood flow and oxygen to tissues while discussing progress ? Standing hamstring stretch 3 x 30 sec ? Standing quad/hip flexor stretch 3 x 30 sec ? Supine piriformis stretch ? Pelvic Tilt x 20 ? Pelvic Tilt with dying bug x 20 ?Lengthy amount of time spent on educating on anatomy of the spine and positioning for decompression along with importance of flexibility.  Also suggested using ice for pain relief in lieu of the heat to see if this controlled his pain better.   ?Ice pack to lumbar spine following treatment x 10 min in hooklying with feet propped on bolster.    ?09/06/21: ?Discussion of response to previous HEP with pain levels and recovery time. ?   Used spine model to discuss stenosis and goal for spinal gapping and opening  ?   DKTC 30 sec hold 5x ?   Hang from lat bar on max  weight ?   Table top decompression per HEP  ?      ?08/30/2021 ?Instruction on exercises to decrease overactivation of lumbar paraspinals: seated cat/cow with exhalation 10x ?Discussed quadruped or leaning on  bed rocking or cat/cow in the AM for pain modulation ?Long sit on edge of bed HS stretch with ankle pump 10x right/left ?DKTC with LE dangle over side of bed 1 minute each side ?Standing hip abduction 10x right/left ?Standing bent over high table bent knee hip extension 2 sets of 5 each leg ? ? ?  ?  ?PATIENT EDUCATION:  ?Education details: Handout to calm CNS ?Person educated: Patient ?Education method: Explanation ?Education comprehension: verbalized understanding ?  ?  ?HOME EXERCISE PROGRAM:  ?Access Code: [email or web address] ?URL: [email or web address] ?Date: 09/06/2021 ?Prepared by: [name] ? ?Exercises ?- Seated Cat Cow  - 1 x daily - 7 x weekly - 1 sets - 10 reps ?- Seated Table Hamstring Stretch  - 1 x daily - 7 x weekly - 1 sets - 5 reps - 10 hold ?Marcello Moores Stretch on Table  - 1 x daily - 7 x weekly - 1 sets - 2 reps - 60 hold ?- Standing Hip Abduction  - 1 x daily - 7 x weekly - 1 sets - 15 reps ?- Standing Hip Extension  - 1 x daily - 7 x weekly - 3 sets - 10 reps ?- Supine Double Knee to Chest  - 1 x daily - 7 x weekly - 1 sets - 3 reps - 30 hold ?- Self Traction in Standing with Counter Top  - 1 x daily - 7 x weekly - 1 sets - 10 reps - 30 hold ? ? ?  ?ASSESSMENT: ?  ?CLINICAL IMPRESSION:  The patient arrives with considerably less pain than last visit.  Strategized for upcoming trip 2 months away and expected results occurring soon.  He is able to perform low level standing stretches and hip strengthening without increase in pain level.  Therapist monitoring response throughout session. ?  ?OBJECTIVE IMPAIRMENTS decreased activity tolerance, decreased mobility, decreased ROM, decreased strength, increased fascial restrictions, impaired flexibility, and pain.  ?  ?ACTIVITY LIMITATIONS  community activity, shopping, and travel .  ?  ?PERSONAL FACTORS Past/current experiences and Time since onset of injury/illness/exacerbation are also affecting patient's functional outcome.  ?  ?  ?REHAB POTENTIAL: Good ?  ?

## 2021-09-26 ENCOUNTER — Ambulatory Visit: Payer: Medicare HMO | Admitting: Physical Medicine and Rehabilitation

## 2021-09-26 ENCOUNTER — Encounter: Payer: Self-pay | Admitting: Physical Medicine and Rehabilitation

## 2021-09-26 VITALS — BP 156/81 | HR 73

## 2021-09-26 DIAGNOSIS — M48062 Spinal stenosis, lumbar region with neurogenic claudication: Secondary | ICD-10-CM | POA: Diagnosis not present

## 2021-09-26 DIAGNOSIS — M47816 Spondylosis without myelopathy or radiculopathy, lumbar region: Secondary | ICD-10-CM

## 2021-09-26 DIAGNOSIS — M5416 Radiculopathy, lumbar region: Secondary | ICD-10-CM

## 2021-09-26 DIAGNOSIS — M4726 Other spondylosis with radiculopathy, lumbar region: Secondary | ICD-10-CM

## 2021-09-26 NOTE — Progress Notes (Signed)
? ?Marcus Reynolds - 76 y.o. male MRN [id number]  Date of birth: Feb 14, 1946 ? ?Office Visit Note: ?Visit Date: 09/26/2021 ?PCP: Dorothyann Peng, NP ?Referred by: Dorothyann Peng, NP ? ?Subjective: ?Chief Complaint  ?Patient presents with  ? Lower Back - Pain  ? Right Leg - Pain  ? Left Leg - Pain  ? ?HPI: Marcus Reynolds is a 76 y.o. male who comes in today for evaluation of chronic, worsening and severe bilateral lower back pain with radiation to buttocks and posterolateral legs. Patient reports symptoms ongoing for several years, pain worsened over the last several months. He reports pain is exacerbated by prolonged standing and walking, describes pain as constant sore and aching sensation, currently rates as 7 out of 10. Patient reports some relief of pain with home exercise regimen, rest and use of over the counter medications as needed. Patient states he does take Aleve as needed for pain. History of formal physical therapy in 2019 at [facility], reports some relief of pain with these treatments. Patient states he is a very active person and typically walks 3 miles a day. Patients lumbar MRI from 2019 exhibits multi-level facet arthropathy, grade 1 anterolisthesis and severe spinal canal stenosis at L3-L4, moderate spinal canal stenosis noted at L2-L3 and L4-L5. History of 2 lumbar epidural steroid injections performed in our office in 2019, most recent being bilateral L3 transforaminal epidural steroid injection that did provide him with greater than 80% pain relief for 3 months or more. Patient states he is going on overseas trip in June to visit his son and would like to discuss repeating injection. Patient also states he would like to discuss long term options for treatment today. Patient denies focal weakness, numbness and tingling. Patient denies recent trauma or falls.  ? ?Review of Systems  ?Musculoskeletal:  Positive for back pain.  ?Neurological:  Negative for tingling, sensory change,  focal weakness and weakness.  ?All other systems reviewed and are negative. Otherwise per HPI. ? ?Assessment & Plan: ?Visit Diagnoses:  ?  ICD-10-CM   ?1. Lumbar radiculopathy  M54.16 Ambulatory referral to Physical Medicine Rehab  ?  ?2. Other spondylosis with radiculopathy, lumbar region  M47.26 Ambulatory referral to Physical Medicine Rehab  ?  ?3. Spinal stenosis of lumbar region with neurogenic claudication  M48.062 Ambulatory referral to Physical Medicine Rehab  ?  ?4. Facet arthropathy, lumbar  M47.816 Ambulatory referral to Physical Medicine Rehab  ?  ?   ?Plan: Findings:  ?Chronic, worsening and severe bilateral lower back pain with radiation to buttocks and posterolateral legs. Patient continues to have severe pain despite good conservative therapies such as formal physical therapy, home exercise regimen, rest and use of medications. Patients clinical presentation and exam are consistent with neurogenic claudication as a result of spinal canal stenosis, he does have severe spinal canal stenosis at the level of L3-L4. We believe the next step is to repeat bilateral L3 transforaminal epidural steroid injection under fluoroscopic guidance. He did get significant and sustained relief from same injection in 2019. I did discuss medication management and potential for surgical consultation in the future. I do feel patient would benefit from talking with surgeon to discuss treatment options. We feel that we can get patient back in for injection before his vacation in June. Patient is agreeable with plan and has no questions at this time. No red flag symptoms noted upon exam today.  ? ?Prior injection did offer more than 80% relief as noted above and  lasted more than 3 months and did give the patient more functional ability for activities of daily living and was also beneficial in that it did reduce their medication requirement.  Patient has had physical therapy and continues with directed home exercise program.   Current medication management is not beneficial in increasing his functional status.  Please note that procedures are done as part of a comprehensive orthopedic and pain management program with access to in-house orthopedics, spine surgery and physical therapy as well as access to [facility] biopsychosocial counseling if needed. ?   ? ?Meds & Orders: No orders of the defined types were placed in this encounter. ?  ?Orders Placed This Encounter  ?Procedures  ? Ambulatory referral to Physical Medicine Rehab  ?  ?Follow-up: Return for Bilateral L3-L4 transforaminal epidural steroid injection.  ? ?Procedures: ?No procedures performed  ?   ? ?Clinical History: ?EXAM: ?MRI LUMBAR SPINE WITHOUT CONTRAST ?  ?TECHNIQUE: ?Multiplanar, multisequence MR imaging of the lumbar spine was ?performed. No intravenous contrast was administered. ?  ?COMPARISON:  Lumbar spine radiographs August 04, 2017 ?  ?FINDINGS: ?SEGMENTATION: For the purposes of this report, the last well-formed ?intervertebral disc is reported as L5-S1. sacralized L5 vertebral ?body. ?  ?ALIGNMENT: Straightened lumbar lordosis. Grade 1 L3-4 ?anterolisthesis. No spondylolysis. ?  ?VERTEBRAE:Vertebral bodies are intact. Developmentally smaller L5-S1 ?disc. Diffuse disc desiccation. Multilevel mild chronic discogenic ?endplate changes. No abnormal or acute bone marrow signal. ?  ?CONUS MEDULLARIS AND CAUDA EQUINA: Conus medullaris terminates at ?L1-2 and demonstrates normal morphology and signal characteristics. ?Cauda equina is normal. ?  ?PARASPINAL AND OTHER SOFT TISSUES: Included prevertebral and ?paraspinal soft tissues are normal. ?  ?DISC LEVELS: ?  ?T12-L1, L1-2: Annular bulging. Mild facet arthropathy. No canal ?stenosis or neural foraminal narrowing. ?  ?L2-3: Small broad-based disc bulge. Moderate to severe facet ?arthropathy and ligamentum flavum redundancy. 10 mm LEFT facet ?synovial cyst within ligamentum flavum. Moderate canal  stenosis, ?there LEFT lateral recess could affect the traversing LEFT L3 nerve. ?No neural foraminal narrowing. ?  ?L3-4: Anterolisthesis. Moderate broad-based disc bulge. Moderate to ?severe facet arthropathy with bilateral facet effusions. 5 mm LEFT ?facet synovial cyst, potentially rupture through the ligamentum ?flavum. Severe canal stenosis with narrowed lateral recesses, this ?could affect the traversing L4 nerve. Mild bilateral neural ?foraminal narrowing. ?  ?L4-5: Moderate broad-based disc bulge, annular fissure. Moderate ?facet arthropathy and ligamentum flavum redundancy. Moderate canal ?stenosis. Moderate RIGHT, mild LEFT neural foraminal narrowing. ?  ?L5-S1: Moderate RIGHT central to subarticular disc protrusion. ?Moderate facet arthropathy. No canal stenosis. Mild bilateral neural ?foraminal narrowing. ?  ?IMPRESSION: ?1. No fracture.  Grade 1 L3-4 anterolisthesis without spondylolysis. ?2. 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 ?L3-4. Moderate canal stenosis L2-3 and L4-5. ?3. Neural foraminal narrowing L3-4 through L5-S1: Moderate on the ?RIGHT at L4-5. ?  ?  ?Electronically Signed ?  By: Elon Alas M.D. ?  On: 11/23/2017 01:54  ? ?He reports that he has never smoked. He has never used smokeless tobacco. No results for input(s): HGBA1C, LABURIC in the last 8760 hours. ? ?Objective:  VS:  HT:    WT:   BMI:     BP:(!) 156/81  HR:73bpm  TEMP: ( )  RESP:  ?Physical Exam ?Vitals and nursing note reviewed.  ?HENT:  ?   Head: Normocephalic and atraumatic.  ?   Right Ear: External ear normal.  ?   Left Ear: External ear normal.  ?  Nose: Nose normal.  ?   Mouth/Throat:  ?   Mouth: Mucous membranes are moist.  ?Eyes:  ?   Extraocular Movements: Extraocular movements intact.  ?Cardiovascular:  ?   Rate and Rhythm: Normal rate.  ?   Pulses: Normal pulses.  ?Pulmonary:  ?   Effort: Pulmonary effort is normal.  ?Abdominal:  ?   General: Abdomen is flat. There is no  distension.  ?Musculoskeletal:     ?   General: Tenderness present.  ?   Cervical back: Normal range of motion.  ?   Comments: Pt rises from seated position to standing without difficulty. Good lumbar range of motion. Strong d

## 2021-09-26 NOTE — Progress Notes (Signed)
Pt state lower back pain that travels down both legs. Pt state bending and laying down makes the pain worse. Pt state he take pain meds and goes to PT to help ease his pain. ? ?Numeric Pain Rating Scale and Functional Assessment ?Average Pain 7 ?Pain Right Now 4 ?My pain is intermittent, sharp, stabbing, and aching ?Pain is worse with: bending and some activites ?Pain improves with: therapy/exercise and medication ? ? ?In the last MONTH (on 0-10 scale) has pain interfered with the following? ? ?1. General activity like being  able to carry out your everyday physical activities such as walking, climbing stairs, carrying groceries, or moving a chair?  ?Rating(5) ? ?2. Relation with others like being able to carry out your usual social activities and roles such as  activities at home, at work and in your community. ?Rating(6) ? ?3. Enjoyment of life such that you have  been bothered by emotional problems such as feeling anxious, depressed or irritable?  ?Rating(7) ? ?

## 2021-09-27 ENCOUNTER — Ambulatory Visit: Payer: Medicare HMO | Admitting: Physical Therapy

## 2021-09-27 DIAGNOSIS — M6281 Muscle weakness (generalized): Secondary | ICD-10-CM

## 2021-09-27 DIAGNOSIS — G8929 Other chronic pain: Secondary | ICD-10-CM | POA: Diagnosis not present

## 2021-09-27 DIAGNOSIS — M545 Low back pain, unspecified: Secondary | ICD-10-CM | POA: Diagnosis not present

## 2021-09-27 NOTE — Therapy (Signed)
?OUTPATIENT PHYSICAL THERAPY TREATMENT NOTE ? ? ?Patient Name: Marcus Reynolds ?MRN: [id number] ?DOB:Oct 30, 1945, 76 y.o., male ?Today's Date: 09/27/2021 ? ?PCP: Dorothyann Peng, NP ?REFERRING PROVIDER: Dorothyann Peng, NP ? ? PT End of Session - 09/13/21 1237   ? ? Visit Number 6  ? Date for PT Re-Evaluation 10/23/21   ? Authorization Type [employer] Medicare   ? PT Start Time 1230   ? PT Stop Time 1315  ? PT Time Calculation (min) 45 min   ? Activity Tolerance Patient tolerated treatment well   ? ?  ?  ? ?  ? ? ?Past Medical History:  ?Diagnosis Date  ? Hyperlipidemia   ? high tri  ? Personal history of colonic polyps 12/25/2010  ? ?Past Surgical History:  ?Procedure Laterality Date  ? COLONOSCOPY  12/25/2010  ? internal hemorrhoids  ? COLONOSCOPY W/ POLYPECTOMY  2007  ? [place], [place]  ? INGUINAL HERNIA REPAIR    ? right side  ? TONSILECTOMY, ADENOIDECTOMY, BILATERAL MYRINGOTOMY AND TUBES    ? ?Patient Active Problem List  ? Diagnosis Date Noted  ? Osteoarthritis of spine with radiculopathy, lumbar region 08/04/2017  ? Routine general medical examination at a health care facility 09/23/2013  ? ED (erectile dysfunction) 07/23/2011  ? History of colonic polyps 12/25/2010  ? HYPERTRIGLYCERIDEMIA 06/13/2009  ? ? ?REFERRING DIAG: back pain  ? ?THERAPY DIAG:  ?Chronic bilateral low back pain without sciatica ? ?Muscle weakness (generalized) ? ? ?SUBJECTIVE:  Saw the pain doctor yesterday and they said I'm classic spinal stenosis and want to do another injection mid to late May.  That (band) really works me.  I want to do my core ex's.  I'd like to try the lumbar traction like we did a few years ago.   ?PAIN:  ?Are you having pain? PAIN:  ?Are you having pain? Yes ?NPRS scale: 4/10  ?Pain location: back and gluteals ?Pain orientation: Right and Left  ?PAIN TYPE: aching ? ?PCP: Dorothyann Peng, NP ?  ?REFERRING PROVIDER: Dorothyann Peng, NP ?  ?REFERRING DIAG: chronic midline low back pain without sciatica ?  ?THERAPY DIAG:  ?Low back  pain ?  ?ONSET DATE: 06/10/2021 ?  ?LIVING ENVIRONMENT: ?Lives with: lives with their spouse ?Lives in: House/apartment ?Stairs: Yes;  ?Has following equipment at home: None ?  ?OCCUPATION: retired  ?  ?PLOF: Independent ?  ?PATIENT GOALS go on trip to [place] in June ?  ?  ?OBJECTIVE:  ?  ?DIAGNOSTIC FINDINGS:  ?Arthritis in spine ?  ?PATIENT SURVEYS:  ?FOTO 53%  ?  ?SCREENING FOR RED FLAGS: ?Bowel or bladder incontinence: No ?Spinal tumors: No ?Cauda equina syndrome: No ?Compression fracture: No ?Abdominal aneurysm: No ?  ?COGNITION: ?          Overall cognitive status: Within functional limits for tasks assessed               ?           ?  ?  ?MUSCLE LENGTH: ?Hamstrings: Right 40 deg; Left 40 deg ?Decreased hip flexor lengths 0 degrees in sidelying ?POSTURE:  ?Dec lumbar lordosis ?  ?PALPATION: ?Tenderness in lumbar soft tissue and gluteals bil ?  ?LUMBAR ROM:   Prone lying uncomfortable ?  ?Active  A/PROM  ?08/28/2021  ?Flexion Fingertips to 12 in from toes   ?Extension 3 painful   ?Right lateral flexion 20  ?Left lateral flexion 20  ?Right rotation    ?Left rotation    ? (Blank rows =  not tested) ?  ?  ?LE MMT:  ?           Relies on UE assist with sit to stand; decreased lower abdominal and trunk extensor strength  ?MMT Right ?08/28/2021 Left ?08/28/2021  ?Hip flexion 4 4  ?Hip extension 4 4  ?Hip abduction 4- 4-  ?Hip adduction      ?Hip internal rotation      ?Hip external rotation      ?Knee flexion      ?Knee extension      ?Ankle dorsiflexion      ?Ankle plantarflexion      ?Ankle inversion      ?Ankle eversion      ? (Blank rows = not tested) ?  ?LUMBAR SPECIAL TESTS: right > left ?Straight leg raise test: Positive, Slump test: Positive, and Single leg stance test: Negative ?  ?GAIT: ?No device, dec hip extension noted ?  ?TODAY'S TREATMENT: ?09/27/21: ? ?Nu-Step L5 5 min while discussing status ?Supine: abdominal brace with hand to knee isometric push 10x;  ab brace with bent knee lift and lower 10x  2 ?Mechanical traction supine 65#/ 30# 15 sec hold 15 min ? ? ? ? ? ?09/25/21: ?Physiology of healing and rate of recovery "stone cutter analogy"  ?Discussion of back supports for upcoming trip ?At the steps: HS stretch, hip flexor stretch with UE reach on each side 10x  ?Partial squat at the steps 3x ?Green band above the knees hip extension and hip abduction 10x each side leaning forward with hands on counter ? ? ? ?09/20/21: ?Supine with large bolster under knees with moist heat and ES to bil low back and upper gluteals 23m x 15 min for pain reduction ?Discussed "box breathing" as a tool to calm oversensitive central nervous system ?Handout "Take your MEDS" strategies to calm CNS ? ? ?09/13/21: ?SKTC 3x right/left 10 sec holds ? Standing hamstring stretch 3 x 30 sec right/left  ? Seated blue ball roll out without and with bias 3x each way; ? Pelvic Tilt x 10 5 sec holds  ? Pelvic Tilt with dying bug x 20; ? Standing red band bil shoulder extension in staggered stance and with march 5x each; ? Standing red band staggered stance pallof press 5x to each side ? Plank on high table elbow taps 6x; ?Lengthy amount of time spent on educating on pain and activity  ?Ice pack to lumbar spine in hooklying/bolster 5 min ?09/11/21: ?NuStep x 5 min to improve blood flow and oxygen to tissues while discussing progress ? Standing hamstring stretch 3 x 30 sec ? Standing quad/hip flexor stretch 3 x 30 sec ? Supine piriformis stretch ? Pelvic Tilt x 20 ? Pelvic Tilt with dying bug x 20 ?Lengthy amount of time spent on educating on anatomy of the spine and positioning for decompression along with importance of flexibility.  Also suggested using ice for pain relief in lieu of the heat to see if this controlled his pain better.   ?Ice pack to lumbar spine following treatment x 10 min in hooklying with feet propped on bolster.    ?09/06/21: ?Discussion of response to previous HEP with pain levels and recovery time. ?   Used spine model to  discuss stenosis and goal for spinal gapping and opening  ?   DKTC 30 sec hold 5x ?   Hang from lat bar on max weight ?   Table top decompression per HEP  ?      ?  08/30/2021 ?Instruction on exercises to decrease overactivation of lumbar paraspinals: seated cat/cow with exhalation 10x ?Discussed quadruped or leaning on bed rocking or cat/cow in the AM for pain modulation ?Long sit on edge of bed HS stretch with ankle pump 10x right/left ?DKTC with LE dangle over side of bed 1 minute each side ?Standing hip abduction 10x right/left ?Standing bent over high table bent knee hip extension 2 sets of 5 each leg ? ? ?  ?  ?PATIENT EDUCATION:  ?Education details: Handout to calm CNS ?Person educated: Patient ?Education method: Explanation ?Education comprehension: verbalized understanding ?  ?  ?HOME EXERCISE PROGRAM:  ?Access Code: [email or web address] ?URL: [email or web address] ?Date: 09/06/2021 ?Prepared by: [name] ? ?Exercises ?- Seated Cat Cow  - 1 x daily - 7 x weekly - 1 sets - 10 reps ?- Seated Table Hamstring Stretch  - 1 x daily - 7 x weekly - 1 sets - 5 reps - 10 hold ?Marcello Moores Stretch on Table  - 1 x daily - 7 x weekly - 1 sets - 2 reps - 60 hold ?- Standing Hip Abduction  - 1 x daily - 7 x weekly - 1 sets - 15 reps ?- Standing Hip Extension  - 1 x daily - 7 x weekly - 3 sets - 10 reps ?- Supine Double Knee to Chest  - 1 x daily - 7 x weekly - 1 sets - 3 reps - 30 hold ?- Self Traction in Standing with Counter Top  - 1 x daily - 7 x weekly - 1 sets - 10 reps - 30 hold ? ? ?  ?ASSESSMENT: ?  ?CLINICAL IMPRESSION:  Good activation of lower abdominals in supine at a grade 4/5 level without compensatory strategies.  The patient requests trial of traction as he felt this was somewhat helpful in the past. Good initial response with low duration hold and tension.  Therapist monitoring response to all interventions and modifying treatment accordingly.    ? ?  ?OBJECTIVE IMPAIRMENTS decreased activity tolerance,  decreased mobility, decreased ROM, decreased strength, increased fascial restrictions, impaired flexibility, and pain.  ?  ?ACTIVITY LIMITATIONS community activity, shopping, and travel .  ?  ?PERSONAL FACTORS Past/cu

## 2021-10-04 ENCOUNTER — Ambulatory Visit: Payer: Medicare HMO | Admitting: Physical Therapy

## 2021-10-04 DIAGNOSIS — G8929 Other chronic pain: Secondary | ICD-10-CM | POA: Diagnosis not present

## 2021-10-04 DIAGNOSIS — M545 Low back pain, unspecified: Secondary | ICD-10-CM | POA: Diagnosis not present

## 2021-10-04 DIAGNOSIS — M6281 Muscle weakness (generalized): Secondary | ICD-10-CM

## 2021-10-04 NOTE — Therapy (Addendum)
OUTPATIENT PHYSICAL THERAPY TREATMENT NOTE/Discharge Summary    Patient Name: Marcus Reynolds MRN: [id number] DOB:10-20-1945, 76 y.o., male Today's Date: 10/04/2021  PCP: Dorothyann Peng, NP REFERRING PROVIDER: Dorothyann Peng, NP   PT End of Session - 09/13/21 1237     Visit Number 6   Date for PT Re-Evaluation 10/23/21    Authorization Type Aetna Medicare    PT Start Time 9480    PT Stop Time 1655   PT Time Calculation (min) 45 min    Activity Tolerance Patient tolerated treatment well             Past Medical History:  Diagnosis Date   Hyperlipidemia    high tri   Personal history of colonic polyps 12/25/2010   Past Surgical History:  Procedure Laterality Date   COLONOSCOPY  12/25/2010   internal hemorrhoids   COLONOSCOPY W/ POLYPECTOMY  2007   [place], [place]     right side   TONSILECTOMY, ADENOIDECTOMY, BILATERAL MYRINGOTOMY AND TUBES     Patient Active Problem List   Diagnosis Date Noted   Osteoarthritis of spine with radiculopathy, lumbar region 08/04/2017   Routine general medical examination at a health care facility 09/23/2013   ED (erectile dysfunction) 07/23/2011   History of colonic polyps 12/25/2010   HYPERTRIGLYCERIDEMIA 06/13/2009    REFERRING DIAG: back pain   THERAPY DIAG:  Chronic bilateral low back pain without sciatica  Muscle weakness (generalized)   SUBJECTIVE:  I liked the traction.  Felt good the rest of the day but it returned later.   Today is a good day, mornings not quite as bad.  On order back support.  Cataract surgery next Tuesday.  ESI around 5/20.   PAIN:  Are you having pain? PAIN:  Are you having pain? Yes NPRS scale: 3-4/10  (5 or 6 getting out of the car) Pain location: back and gluteals Pain orientation: Right and Left  PAIN TYPE: aching  PCP: Dorothyann Peng, NP   REFERRING PROVIDER: Dorothyann Peng, NP   REFERRING DIAG: chronic midline low back pain without sciatica   THERAPY DIAG:  Low  back pain   ONSET DATE: 06/10/2021   LIVING ENVIRONMENT: Lives with: lives with their spouse Lives in: House/apartment Stairs: Yes;  Has following equipment at home: None   OCCUPATION: retired    PLOF: [place] go on trip to [place] in June     OBJECTIVE:    DIAGNOSTIC FINDINGS:  Arthritis in spine   PATIENT SURVEYS:  FOTO 53%    SCREENING FOR RED FLAGS: Bowel or bladder incontinence: No Spinal tumors: No Cauda equina syndrome: No Compression fracture: No Abdominal aneurysm: No   COGNITION:           Overall cognitive status: Within functional limits for tasks assessed                              MUSCLE LENGTH: Hamstrings: Right 40 deg; Left 40 deg Decreased hip flexor lengths 0 degrees in sidelying POSTURE:  Dec lumbar lordosis   PALPATION: Tenderness in lumbar soft tissue and gluteals bil   LUMBAR ROM:   Prone lying uncomfortable   Active  A/PROM  08/28/2021  Flexion Fingertips to 12 in from toes   Extension 3 painful   Right lateral flexion 20  Left lateral flexion 20  Right rotation    Left rotation     (Blank rows =  not tested)     LE MMT:             Relies on UE assist with sit to stand; decreased lower abdominal and trunk extensor strength  MMT Right 08/28/2021 Left 08/28/2021  Hip flexion 4 4  Hip extension 4 4  Hip abduction 4- 4-  Hip adduction      Hip internal rotation      Hip external rotation      Knee flexion      Knee extension      Ankle dorsiflexion      Ankle plantarflexion      Ankle inversion      Ankle eversion       (Blank rows = not tested)   LUMBAR SPECIAL TESTS: right > left Straight leg raise test: Positive, Slump test: Positive, and Single leg stance test: Negative   GAIT: No device, dec hip extension noted   TODAY'S TREATMENT: 4/27: Nu-Step L5 5 min while discussing status Supine: abdominal brace with hand to knee isometric hold with Pilates ball 10x with added UE/LE lift  Sidelying  Pilates ball press with bent knee raise 6x each side  ab brace with bent knee lift and lower 10x 2 Mechanical traction supine 65#/ 30# 15 sec hold 15 min    09/27/21:  Nu-Step L5 5 min while discussing status Supine: abdominal brace with hand to knee isometric push 10x;  ab brace with bent knee lift and lower 10x 2 Mechanical traction supine 65#/ 30# 15 sec hold 15 min      09/25/21: Physiology of healing and rate of recovery "stone cutter analogy"  Discussion of back supports for upcoming trip At the steps: HS stretch, hip flexor stretch with UE reach on each side 10x  Partial squat at the steps 3x Green band above the knees hip extension and hip abduction 10x each side leaning forward with hands on counter    09/20/21: Supine with large bolster under knees with moist heat and ES to bil low back and upper gluteals 50m x 15 min for pain reduction Discussed "box breathing" as a tool to calm oversensitive central nervous system Handout "Take your MEDS" strategies to calm CNS   09/13/21: SKTC 3x right/left 10 sec holds  Standing hamstring stretch 3 x 30 sec right/left   Seated blue ball roll out without and with bias 3x each way;  Pelvic Tilt x 10 5 sec holds   Pelvic Tilt with dying bug x 20;  Standing red band bil shoulder extension in staggered stance and with march 5x each;  Standing red band staggered stance pallof press 5x to each side  Plank on high table elbow taps 6x; Lengthy amount of time spent on educating on pain and activity  Ice pack to lumbar spine in hooklying/bolster 5 min 09/11/21: NuStep x 5 min to improve blood flow and oxygen to tissues while discussing progress  Standing hamstring stretch 3 x 30 sec  Standing quad/hip flexor stretch 3 x 30 sec  Supine piriformis stretch  Pelvic Tilt x 20  Pelvic Tilt with dying bug x 20 Lengthy amount of time spent on educating on anatomy of the spine and positioning for decompression along with importance of flexibility.   Also suggested using ice for pain relief in lieu of the heat to see if this controlled his pain better.   Ice pack to lumbar spine following treatment x 10 min in hooklying with feet propped on bolster.    09/06/21:  Discussion of response to previous HEP with pain levels and recovery time.    Used spine model to discuss stenosis and goal for spinal gapping and opening     DKTC 30 sec hold 5x    Hang from lat bar on max weight    Table top decompression per HEP        08/30/2021 Instruction on exercises to decrease overactivation of lumbar paraspinals: seated cat/cow with exhalation 10x Discussed quadruped or leaning on bed rocking or cat/cow in the AM for pain modulation Long sit on edge of bed HS stretch with ankle pump 10x right/left DKTC with LE dangle over side of bed 1 minute each side Standing hip abduction 10x right/left Standing bent over high table bent knee hip extension 2 sets of 5 each leg       PATIENT EDUCATION:  Education details: Handout to calm CNS Person educated: Patient Education method: Explanation Education comprehension: verbalized understanding     HOME EXERCISE PROGRAM:  Access Code: [email or web address] URL: [email or web address] Date: 09/06/2021 Prepared by: [name]  Exercises - Seated Cat Cow  - 1 x daily - 7 x weekly - 1 sets - 10 reps - Seated Table Hamstring Stretch  - 1 x daily - 7 x weekly - 1 sets - 5 reps - 10 hold - Thomas Stretch on Table  - 1 x daily - 7 x weekly - 1 sets - 2 reps - 60 hold - Standing Hip Abduction  - 1 x daily - 7 x weekly - 1 sets - 15 reps - Standing Hip Extension  - 1 x daily - 7 x weekly - 3 sets - 10 reps - Supine Double Knee to Chest  - 1 x daily - 7 x weekly - 1 sets - 3 reps - 30 hold - Self Traction in Standing with Counter Top  - 1 x daily - 7 x weekly - 1 sets - 10 reps - 30 hold     ASSESSMENT:   CLINICAL IMPRESSION: Good response to low level core strengthening in supine and sidelying.  Still  some fear of response to physical activity.  Positive outcome with mechanical traction last visit and continued trial at same parameters.  Therapist monitoring response and providing cues for optimal muscle activation.     OBJECTIVE IMPAIRMENTS decreased activity tolerance, decreased mobility, decreased ROM, decreased strength, increased fascial restrictions, impaired flexibility, and pain.    ACTIVITY LIMITATIONS community activity, shopping, and travel .    PERSONAL FACTORS Past/current experiences and Time since onset of injury/illness/exacerbation are also affecting patient's functional outcome.      REHAB POTENTIAL: Good   CLINICAL DECISION MAKING: Stable/uncomplicated   EVALUATION COMPLEXITY: Low     GOALS: Goals reviewed with patient? Yes   SHORT TERM GOALS: Target date: 09/25/2021   The patient will demonstrate knowledge of basic self care strategies and compliant with basic home exercises Baseline:  Goal status: INITIAL   2.  The patient will report a 50% improvement in buttock, thigh pain in the AM with sit to stand and going up/down the stairs Baseline:  Goal status: INITIAL   3.  The patient will have improved lumbar flexion to 6 inches from toes and HS length to 50 deg bil needed for dressing, grooming tasks Baseline:  Goal status: INITIAL     LONG TERM GOALS: Target date: 10/23/2021   The patient will be independent with safe self progression of a HEP and return to the gym at [place]  for further improvement in function Baseline:  Goal status: INITIAL   2.  The patient will report a 75% improvement in bil buttock and thigh pain in the mornings and with transitional movements like sit to stand and stairs Baseline:  Goal status: INITIAL   3.  The patient will have improved lumbo-pelvic-hip strength to grossly 4+/5 needed for lifting luggage and heavier grocery items Baseline:  Goal status: INITIAL   4.  The patient will have improved lumbar and LE  mobility and flexibility to [facility] needed for upcoming travel out of the country Baseline:  Goal status: INITIAL   5.  FOTO score improved from 53% to 66% Baseline:  Goal status: INITIAL       PLAN: PT FREQUENCY: 2x/week   PT DURATION: 8 weeks   PLANNED INTERVENTIONS: Therapeutic exercises, Therapeutic activity, Neuromuscular re-education, Balance training, Gait training, Patient/Family education, Joint mobilization, Aquatic Therapy, Dry Needling, Electrical stimulation, Spinal manipulation, Spinal mobilization, Moist heat, Taping, Traction, Ultrasound, Ionotophoresis 73m/ml Dexamethasone, and Manual therapy.   PLAN FOR NEXT SESSION:  follow up after cataract surgery;  traction; core ex's; general strengthening; Nu-step    SRuben Im PT 10/04/21 1:18 PM Phone: [phone]: [phone]  PHYSICAL THERAPY DISCHARGE SUMMARY  Visits from Start of Care: 6  Current functional level related to goals / functional outcomes: The patient did not return after cataract surgery and chart has been inactive since April.  Will discharge from PT at this time but will be happy to see him if needed in the future with a new PT referral.     Remaining deficits: As above   Education / Equipment: HEP   Patient agrees to discharge. Patient goals were partially met. Patient is being discharged due to not returning since the last visit.  SRuben Im PT 11/15/21 9:13 AM Phone: [phone]: [phone]

## 2021-10-08 HISTORY — PX: CATARACT EXTRACTION: SUR2

## 2021-10-09 DIAGNOSIS — H25811 Combined forms of age-related cataract, right eye: Secondary | ICD-10-CM | POA: Diagnosis not present

## 2021-11-01 ENCOUNTER — Encounter: Payer: Self-pay | Admitting: Physical Medicine and Rehabilitation

## 2021-11-01 ENCOUNTER — Ambulatory Visit: Payer: Self-pay

## 2021-11-01 ENCOUNTER — Ambulatory Visit: Payer: Medicare HMO | Admitting: Physical Medicine and Rehabilitation

## 2021-11-01 VITALS — BP 156/77 | HR 81

## 2021-11-01 DIAGNOSIS — M5416 Radiculopathy, lumbar region: Secondary | ICD-10-CM

## 2021-11-01 MED ORDER — METHYLPREDNISOLONE ACETATE 80 MG/ML IJ SUSP
80.0000 mg | Freq: Once | INTRAMUSCULAR | Status: AC
  Administered 2021-11-01: 80 mg

## 2021-11-01 NOTE — Patient Instructions (Signed)

## 2021-11-01 NOTE — Progress Notes (Signed)
Pt state lower back pain that travels down both legs. Pt state bending and laying down makes the pain worse. Pt state he take pain meds and goes to PT to help ease his pain.  Numeric Pain Rating Scale and Functional Assessment Average Pain 7   In the last MONTH (on 0-10 scale) has pain interfered with the following?  1. General activity like being  able to carry out your everyday physical activities such as walking, climbing stairs, carrying groceries, or moving a chair?  Rating(8)   +Driver, -BT, -Dye Allergies.

## 2021-11-07 NOTE — Procedures (Signed)
Lumbosacral Transforaminal Epidural Steroid Injection - Sub-Pedicular Approach with Fluoroscopic Guidance  Patient: Marcus Reynolds      Date of Birth: 1946-02-26 MRN: [id number] PCP: Dorothyann Peng, NP      Visit Date: 11/01/2021   Universal Protocol:    Date/Time: 11/01/2021  Consent Given By: the patient  Position: PRONE  Additional Comments: Vital signs were monitored before and after the procedure. Patient was prepped and draped in the usual sterile fashion. The correct patient, procedure, and site was verified.   Injection Procedure Details:   Procedure diagnoses: Lumbar radiculopathy [M54.16]    Meds Administered:  Meds ordered this encounter  Medications   methylPREDNISolone acetate (DEPO-MEDROL) injection 80 mg    Laterality: Bilateral  Location/Site: L3  Needle:5.0 in., 22 ga.  Short bevel or Quincke spinal needle  Needle Placement: Transforaminal  Findings:    -Comments: Excellent flow of contrast along the nerve, nerve root and into the epidural space.  Procedure Details: After squaring off the end-plates to get a true AP view, the C-arm was positioned so that an oblique view of the foramen as noted above was visualized. The target area is just inferior to the "nose of the scotty dog" or sub pedicular. The soft tissues overlying this structure were infiltrated with 2-3 ml. of 1% Lidocaine without Epinephrine.  The spinal needle was inserted toward the target using a "trajectory" view along the fluoroscope beam.  Under AP and lateral visualization, the needle was advanced so it did not puncture dura and was located close the 6 O'Clock position of the pedical in AP tracterory. Biplanar projections were used to confirm position. Aspiration was confirmed to be negative for CSF and/or blood. A 1-2 ml. volume of Isovue-250 was injected and flow of contrast was noted at each level. Radiographs were obtained for documentation purposes.   After attaining the desired flow  of contrast documented above, a 0.5 to 1.0 ml test dose of 0.25% Marcaine was injected into each respective transforaminal space.  The patient was observed for 90 seconds post injection.  After no sensory deficits were reported, and normal lower extremity motor function was noted,   the above injectate was administered so that equal amounts of the injectate were placed at each foramen (level) into the transforaminal epidural space.   Additional Comments:  No complications occurred Dressing: 2 x 2 sterile gauze and Band-Aid    Post-procedure details: Patient was observed during the procedure. Post-procedure instructions were reviewed.  Patient left the clinic in stable condition.

## 2021-11-07 NOTE — Progress Notes (Signed)
Marcus Reynolds - 76 y.o. male MRN [id number]  Date of birth: 05/25/1946  Office Visit Note: Visit Date: 11/01/2021 PCP: Dorothyann Peng, NP Referred by: Dorothyann Peng, NP  Subjective: Chief Complaint  Patient presents with   Lower Back - Pain   Right Leg - Pain   Left Leg - Pain   HPI:  Marcus Reynolds is a 76 y.o. male who comes in today at the request of Barnet Pall, FNP for planned Bilateral L3-4 Lumbar Transforaminal epidural steroid injection with fluoroscopic guidance.  The patient has failed conservative care including home exercise, medications, time and activity modification.  This injection will be diagnostic and hopefully therapeutic.  Please see requesting physician notes for further details and justification.  ROS Otherwise per HPI.  Assessment & Plan: Visit Diagnoses:    ICD-10-CM   1. Lumbar radiculopathy  M54.16 XR C-ARM NO REPORT    Epidural Steroid injection    methylPREDNISolone acetate (DEPO-MEDROL) injection 80 mg      Plan: No additional findings.   Meds & Orders:  Meds ordered this encounter  Medications   methylPREDNISolone acetate (DEPO-MEDROL) injection 80 mg    Orders Placed This Encounter  Procedures   XR C-ARM NO REPORT   Epidural Steroid injection    Follow-up: Return if symptoms worsen or fail to improve.   Procedures: No procedures performed  Lumbosacral Transforaminal Epidural Steroid Injection - Sub-Pedicular Approach with Fluoroscopic Guidance  Patient: Marcus Reynolds      Date of Birth: December 26, 1945 MRN: [id number] PCP: Dorothyann Peng, NP      Visit Date: 11/01/2021   Universal Protocol:    Date/Time: 11/01/2021  Consent Given By: the patient  Position: PRONE  Additional Comments: Vital signs were monitored before and after the procedure. Patient was prepped and draped in the usual sterile fashion. The correct patient, procedure, and site was verified.   Injection Procedure Details:   Procedure diagnoses: Lumbar radiculopathy  [M54.16]    Meds Administered:  Meds ordered this encounter  Medications   methylPREDNISolone acetate (DEPO-MEDROL) injection 80 mg    Laterality: Bilateral  Location/Site: L3  Needle:5.0 in., 22 ga.  Short bevel or Quincke spinal needle  Needle Placement: Transforaminal  Findings:    -Comments: Excellent flow of contrast along the nerve, nerve root and into the epidural space.  Procedure Details: After squaring off the end-plates to get a true AP view, the C-arm was positioned so that an oblique view of the foramen as noted above was visualized. The target area is just inferior to the "nose of the scotty dog" or sub pedicular. The soft tissues overlying this structure were infiltrated with 2-3 ml. of 1% Lidocaine without Epinephrine.  The spinal needle was inserted toward the target using a "trajectory" view along the fluoroscope beam.  Under AP and lateral visualization, the needle was advanced so it did not puncture dura and was located close the 6 O'Clock position of the pedical in AP tracterory. Biplanar projections were used to confirm position. Aspiration was confirmed to be negative for CSF and/or blood. A 1-2 ml. volume of Isovue-250 was injected and flow of contrast was noted at each level. Radiographs were obtained for documentation purposes.   After attaining the desired flow of contrast documented above, a 0.5 to 1.0 ml test dose of 0.25% Marcaine was injected into each respective transforaminal space.  The patient was observed for 90 seconds post injection.  After no sensory deficits were reported, and normal lower extremity motor function was noted,  the above injectate was administered so that equal amounts of the injectate were placed at each foramen (level) into the transforaminal epidural space.   Additional Comments:  No complications occurred Dressing: 2 x 2 sterile gauze and Band-Aid    Post-procedure details: Patient was observed during the  procedure. Post-procedure instructions were reviewed.  Patient left the clinic in stable condition.    Clinical History: EXAM: MRI LUMBAR SPINE WITHOUT CONTRAST   TECHNIQUE: Multiplanar, multisequence MR imaging of the lumbar spine was performed. No intravenous contrast was administered.   COMPARISON:  Lumbar spine radiographs August 04, 2017   FINDINGS: SEGMENTATION: For the purposes of this report, the last well-formed intervertebral disc is reported as L5-S1. sacralized L5 vertebral body.   ALIGNMENT: Straightened lumbar lordosis. Grade 1 L3-4 anterolisthesis. No spondylolysis.   VERTEBRAE:Vertebral bodies are intact. Developmentally smaller L5-S1 disc. Diffuse disc desiccation. Multilevel mild chronic discogenic endplate changes. No abnormal or acute bone marrow signal.   CONUS MEDULLARIS AND CAUDA EQUINA: Conus medullaris terminates at L1-2 and demonstrates normal morphology and signal characteristics. Cauda equina is normal.   PARASPINAL AND OTHER SOFT TISSUES: Included prevertebral and paraspinal soft tissues are normal.   DISC LEVELS:   T12-L1, L1-2: Annular bulging. Mild facet arthropathy. No canal stenosis or neural foraminal narrowing.   L2-3: Small broad-based disc bulge. Moderate to severe facet arthropathy and ligamentum flavum redundancy. 10 mm LEFT facet synovial cyst within ligamentum flavum. Moderate canal stenosis, there LEFT lateral recess could affect the traversing LEFT L3 nerve. No neural foraminal narrowing.   L3-4: Anterolisthesis. Moderate broad-based disc bulge. Moderate to severe facet arthropathy with bilateral facet effusions. 5 mm LEFT facet synovial cyst, potentially rupture through the ligamentum flavum. Severe canal stenosis with narrowed lateral recesses, this could affect the traversing L4 nerve. Mild bilateral neural foraminal narrowing.   L4-5: Moderate broad-based disc bulge, annular fissure. Moderate facet arthropathy and  ligamentum flavum redundancy. Moderate canal stenosis. Moderate RIGHT, mild LEFT neural foraminal narrowing.   L5-S1: Moderate RIGHT central to subarticular disc protrusion. Moderate facet arthropathy. No canal stenosis. Mild bilateral neural foraminal narrowing.   IMPRESSION: 1. No fracture.  Grade 1 L3-4 anterolisthesis without spondylolysis. 2. 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 L3-4. Moderate canal stenosis L2-3 and L4-5. 3. Neural foraminal narrowing L3-4 through L5-S1: Moderate on the RIGHT at L4-5.     Electronically Signed   By: Elon Alas M.D.   On: 11/23/2017 01:54     Objective:  VS:  HT:    WT:   BMI:     BP:(!) 156/77  HR:81bpm  TEMP: ( )  RESP:  Physical Exam Vitals and nursing note reviewed.  Constitutional:      General: He is not in acute distress.    Appearance: Normal appearance. He is not ill-appearing.  HENT:     Head: Normocephalic and atraumatic.     Right Ear: External ear normal.     Left Ear: External ear normal.     Nose: No congestion.  Eyes:     Extraocular Movements: Extraocular movements intact.  Cardiovascular:     Rate and Rhythm: Normal rate.     Pulses: Normal pulses.  Pulmonary:     Effort: Pulmonary effort is normal. No respiratory distress.  Abdominal:     General: There is no distension.     Palpations: Abdomen is soft.  Musculoskeletal:        General: No tenderness or signs of injury.  Cervical back: Neck supple.     Right lower leg: No edema.     Left lower leg: No edema.     Comments: Patient has good distal strength without clonus.  Skin:    Findings: No erythema or rash.  Neurological:     General: No focal deficit present.     Mental Status: He is alert and oriented to person, place, and time.     Sensory: No sensory deficit.     Motor: No weakness or abnormal muscle tone.     Coordination: Coordination normal.  Psychiatric:        Mood and Affect: Mood normal.         Behavior: Behavior normal.     Imaging: No results found.

## 2021-11-11 ENCOUNTER — Encounter: Payer: Self-pay | Admitting: Adult Health

## 2021-11-11 ENCOUNTER — Encounter: Payer: Self-pay | Admitting: Physical Medicine and Rehabilitation

## 2021-11-16 ENCOUNTER — Other Ambulatory Visit: Payer: Self-pay | Admitting: Physical Medicine and Rehabilitation

## 2021-11-16 MED ORDER — PREDNISONE 50 MG PO TABS: 50.0000 mg | ORAL_TABLET | Freq: Every day | ORAL | 0 refills | Status: DC

## 2021-11-16 NOTE — Telephone Encounter (Signed)
!!!!   Please advise pt just called about this!!!

## 2021-11-16 NOTE — Progress Notes (Signed)
Prednisone called in pre-trip, he is going out of town for 10 days and would like medication in case his pain returns or he strains lower back.

## 2021-11-19 DIAGNOSIS — Z961 Presence of intraocular lens: Secondary | ICD-10-CM | POA: Diagnosis not present

## 2021-12-13 ENCOUNTER — Telehealth: Payer: Self-pay | Admitting: Physical Medicine and Rehabilitation

## 2021-12-13 NOTE — Telephone Encounter (Signed)
Pt called requesting a call to set an appt. Pt phone number is [phone].

## 2021-12-14 ENCOUNTER — Telehealth: Payer: Self-pay | Admitting: Physical Medicine and Rehabilitation

## 2021-12-14 NOTE — Telephone Encounter (Signed)
Patient is returning a phone call and is requesting a call back today if possible. He asked for you to try calling both numbers listed. CB # [phone] or [phone] Thank you

## 2021-12-17 ENCOUNTER — Ambulatory Visit: Payer: Medicare HMO | Admitting: Physical Medicine and Rehabilitation

## 2021-12-17 ENCOUNTER — Encounter: Payer: Self-pay | Admitting: Physical Medicine and Rehabilitation

## 2021-12-17 VITALS — BP 146/76 | HR 74

## 2021-12-17 DIAGNOSIS — M5416 Radiculopathy, lumbar region: Secondary | ICD-10-CM | POA: Diagnosis not present

## 2021-12-17 DIAGNOSIS — M4726 Other spondylosis with radiculopathy, lumbar region: Secondary | ICD-10-CM

## 2021-12-17 DIAGNOSIS — M47816 Spondylosis without myelopathy or radiculopathy, lumbar region: Secondary | ICD-10-CM | POA: Diagnosis not present

## 2021-12-17 DIAGNOSIS — M48062 Spinal stenosis, lumbar region with neurogenic claudication: Secondary | ICD-10-CM | POA: Diagnosis not present

## 2021-12-17 NOTE — Progress Notes (Unsigned)
Pt state lower back pain that travels down both legs. Pt state bending and laying down makes the pain worse. Pt state he take pain meds. Pt state the injection helped for three weeks  Numeric Pain Rating Scale and Functional Assessment Average Pain 7 Pain Right Now 4 My pain is intermittent, sharp, stabbing, and aching Pain is worse with: walking, bending, sitting, standing, some activites, and laying down Pain improves with: rest, heat/ice, therapy/exercise, medication, and injections   In the last MONTH (on 0-10 scale) has pain interfered with the following?  1. General activity like being  able to carry out your everyday physical activities such as walking, climbing stairs, carrying groceries, or moving a chair?  Rating(5)  2. Relation with others like being able to carry out your usual social activities and roles such as  activities at home, at work and in your community. Rating(6)  3. Enjoyment of life such that you have  been bothered by emotional problems such as feeling anxious, depressed or irritable?  Rating(7)

## 2021-12-17 NOTE — Progress Notes (Unsigned)
Marcus Reynolds - 76 y.o. male MRN [id number]  Date of birth: 11/05/1945  Office Visit Note: Visit Date: 12/17/2021 PCP: Dorothyann Peng, NP Referred by: Dorothyann Peng, NP  Subjective: Chief Complaint  Patient presents with   Lower Back - Pain   Right Leg - Pain   Left Leg - Pain   HPI: Marcus Reynolds is a 76 y.o. male who comes in today for evaluation of chronic, worsening and severe bilateral lower back pain radiating to buttocks and posterolateral legs. Patient reports pain has been ongoing for several years and is exacerbated by prolonged standing and walking. He describes his pain as a sore and aching sensation, currently rates as 4 out of 10. He reports some relief of pain with home exercise regimen, rest and use of medications. History of formal physical therapy in 2019 at [place], reports some relief of pain with these treatments. Patients lumbar MRI from 2019 exhibits multi-level facet arthropathy, grade 1 anterolisthesis and severe spinal canal stenosis at L3-L4, moderate spinal canal stenosis noted at L2-L3 and L4-L5. Patient underwent bilateral L3 transforaminal epidural steroid injection in our office on 11/01/2021 and reports greater than 50% pain relief of pain for approximately 3 weeks, patient had same injection in 2019 and reports significant relief for 3 months. Patient states he traveled to [place] after receiving injection in May and did take short course of Prednisone as his pain gradually returned on his way home. Patient denies focal weakness, numbness and tingling. Patient denies recent trauma or falls.    Review of Systems  Musculoskeletal:  Positive for back pain.  Neurological:  Negative for tingling, sensory change, focal weakness and weakness.  All other systems reviewed and are negative.  Otherwise per HPI.  Assessment & Plan: Visit Diagnoses:    ICD-10-CM   1. Lumbar radiculopathy  M54.16 MR LUMBAR SPINE WO CONTRAST    2. Other  spondylosis with radiculopathy, lumbar region  M47.26 MR LUMBAR SPINE WO CONTRAST    3. Spinal stenosis of lumbar region with neurogenic claudication  M48.062 MR LUMBAR SPINE WO CONTRAST    4. Facet arthropathy, lumbar  M47.816 MR LUMBAR SPINE WO CONTRAST       Plan: Findings:  Chronic, worsening and severe bilateral lower back pain radiating to buttocks and posterolateral legs. Patient continues to have severe pain despite good conservative therapies such as formal physical therapy, home exercise regimen, rest and use of medications. Patients clinical presentation and exam are consistent with neurogenic claudication as a result of spinal canal stenosis. There is severe spinal canal stenosis at the level of L3-L4, moderate at L2-L3 and L4-L5. I discussed treatment options in detail with patient today, we believe the next step is to obtain new lumbar MRI imaging to assess for worsening spinal canal stenosis. We also offered repeat injection, however at this time patient would like to proceed with MRI. I do think it would be beneficial for patient to meet with surgeon to discuss options, I did talk about possible referral to Dr. Sherley Bounds at [facility]. We will have patient follow up after lumbar MRI imaging is obtained, we would consider repeating injection or possibly trying injection at different level depending on results of imaging. No red flag symptoms noted upon exam today.     Meds & Orders: No orders of the defined types were placed in this encounter.   Orders Placed This Encounter  Procedures   MR LUMBAR SPINE WO CONTRAST  Follow-up: Return for follow up after lumbar MRI is obtained.   Procedures: No procedures performed      Clinical History: EXAM: MRI LUMBAR SPINE WITHOUT CONTRAST   TECHNIQUE: Multiplanar, multisequence MR imaging of the lumbar spine was performed. No intravenous contrast was administered.   COMPARISON:  Lumbar spine radiographs August 04, 2017   FINDINGS: SEGMENTATION: For the purposes of this report, the last well-formed intervertebral disc is reported as L5-S1. sacralized L5 vertebral body.   ALIGNMENT: Straightened lumbar lordosis. Grade 1 L3-4 anterolisthesis. No spondylolysis.   VERTEBRAE:Vertebral bodies are intact. Developmentally smaller L5-S1 disc. Diffuse disc desiccation. Multilevel mild chronic discogenic endplate changes. No abnormal or acute bone marrow signal.   CONUS MEDULLARIS AND CAUDA EQUINA: Conus medullaris terminates at L1-2 and demonstrates normal morphology and signal characteristics. Cauda equina is normal.   PARASPINAL AND OTHER SOFT TISSUES: Included prevertebral and paraspinal soft tissues are normal.   DISC LEVELS:   T12-L1, L1-2: Annular bulging. Mild facet arthropathy. No canal stenosis or neural foraminal narrowing.   L2-3: Small broad-based disc bulge. Moderate to severe facet arthropathy and ligamentum flavum redundancy. 10 mm LEFT facet synovial cyst within ligamentum flavum. Moderate canal stenosis, there LEFT lateral recess could affect the traversing LEFT L3 nerve. No neural foraminal narrowing.   L3-4: Anterolisthesis. Moderate broad-based disc bulge. Moderate to severe facet arthropathy with bilateral facet effusions. 5 mm LEFT facet synovial cyst, potentially rupture through the ligamentum flavum. Severe canal stenosis with narrowed lateral recesses, this could affect the traversing L4 nerve. Mild bilateral neural foraminal narrowing.   L4-5: Moderate broad-based disc bulge, annular fissure. Moderate facet arthropathy and ligamentum flavum redundancy. Moderate canal stenosis. Moderate RIGHT, mild LEFT neural foraminal narrowing.   L5-S1: Moderate RIGHT central to subarticular disc protrusion. Moderate facet arthropathy. No canal stenosis. Mild bilateral neural foraminal narrowing.   IMPRESSION: 1. No fracture.  Grade 1 L3-4 anterolisthesis without  spondylolysis. 2. 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 L3-4. Moderate canal stenosis L2-3 and L4-5. 3. Neural foraminal narrowing L3-4 through L5-S1: Moderate on the RIGHT at L4-5.     Electronically Signed   By: Elon Alas M.D.   On: 11/23/2017 01:54   He reports that he has never smoked. He has never used smokeless tobacco. No results for input(s): "HGBA1C", "LABURIC" in the last 8760 hours.  Objective:  VS:  HT:    WT:   BMI:     BP:(!) 146/76  HR:74bpm  TEMP: ( )  RESP:  Physical Exam Vitals and nursing note reviewed.  HENT:     Head: Normocephalic and atraumatic.     Right Ear: External ear normal.     Left Ear: External ear normal.     Nose: Nose normal.     Mouth/Throat:     Mouth: Mucous membranes are moist.  Eyes:     Extraocular Movements: Extraocular movements intact.  Cardiovascular:     Rate and Rhythm: Normal rate.     Pulses: Normal pulses.  Pulmonary:     Effort: Pulmonary effort is normal.  Abdominal:     General: Abdomen is flat. There is no distension.  Musculoskeletal:        General: Tenderness present.     Cervical back: Normal range of motion.     Comments: Pt rises from seated position to standing without difficulty. Good lumbar range of motion. Strong distal strength without clonus, no pain upon palpation of greater trochanters. No pain with internal/external rotation of  bilateral hips. Sensation intact bilaterally. Walks independently, gait steady.    Skin:    General: Skin is warm and dry.     Capillary Refill: Capillary refill takes less than 2 seconds.  Neurological:     General: No focal deficit present.     Mental Status: He is alert and oriented to person, place, and time.  Psychiatric:        Mood and Affect: Mood normal.        Behavior: Behavior normal.     Ortho Exam  Imaging: No results found.  Past Medical/Family/Surgical/Social History: Medications & Allergies reviewed per EMR, new  medications updated. Patient Active Problem List   Diagnosis Date Noted   Osteoarthritis of spine with radiculopathy, lumbar region 08/04/2017   Routine general medical examination at a health care facility 09/23/2013   ED (erectile dysfunction) 07/23/2011   History of colonic polyps 12/25/2010   HYPERTRIGLYCERIDEMIA 06/13/2009   Past Medical History:  Diagnosis Date   Hyperlipidemia    high tri   Personal history of colonic polyps 12/25/2010   Family History  Problem Relation Age of Onset   Heart disease Mother    Diabetes Mother    Ulcerative colitis Mother    Colon cancer Neg Hx    Esophageal cancer Neg Hx    Rectal cancer Neg Hx    Stomach cancer Neg Hx    Past Surgical History:  Procedure Laterality Date   COLONOSCOPY  12/25/2010   internal hemorrhoids   COLONOSCOPY W/ POLYPECTOMY  2007   [place], [place]   INGUINAL HERNIA REPAIR     right side   TONSILECTOMY, ADENOIDECTOMY, BILATERAL MYRINGOTOMY AND TUBES     Social History   Occupational History   Occupation: Retired  Tobacco Use   Smoking status: Never   Smokeless tobacco: Never  Vaping Use   Vaping Use: Never used  Substance and Sexual Activity   Alcohol use: Yes    Alcohol/week: 1.0 standard drink of alcohol    Types: 1 Glasses of wine per week    Comment: socially   Drug use: No   Sexual activity: Not on file

## 2021-12-26 DIAGNOSIS — H43822 Vitreomacular adhesion, left eye: Secondary | ICD-10-CM | POA: Diagnosis not present

## 2021-12-26 DIAGNOSIS — H25812 Combined forms of age-related cataract, left eye: Secondary | ICD-10-CM | POA: Diagnosis not present

## 2021-12-26 DIAGNOSIS — H4423 Degenerative myopia, bilateral: Secondary | ICD-10-CM | POA: Diagnosis not present

## 2021-12-26 DIAGNOSIS — H35371 Puckering of macula, right eye: Secondary | ICD-10-CM | POA: Diagnosis not present

## 2021-12-28 ENCOUNTER — Ambulatory Visit
Admission: RE | Admit: 2021-12-28 | Discharge: 2021-12-28 | Disposition: A | Discharge status: Home / Self Care | Payer: Medicare HMO | Source: Ambulatory Visit | Attending: Physical Medicine and Rehabilitation | Admitting: Physical Medicine and Rehabilitation

## 2021-12-28 DIAGNOSIS — M4726 Other spondylosis with radiculopathy, lumbar region: Secondary | ICD-10-CM

## 2021-12-28 DIAGNOSIS — M48062 Spinal stenosis, lumbar region with neurogenic claudication: Secondary | ICD-10-CM

## 2021-12-28 DIAGNOSIS — M48061 Spinal stenosis, lumbar region without neurogenic claudication: Secondary | ICD-10-CM | POA: Diagnosis not present

## 2021-12-28 DIAGNOSIS — M47816 Spondylosis without myelopathy or radiculopathy, lumbar region: Secondary | ICD-10-CM

## 2021-12-28 DIAGNOSIS — M5416 Radiculopathy, lumbar region: Secondary | ICD-10-CM | POA: Diagnosis not present

## 2022-01-02 ENCOUNTER — Other Ambulatory Visit: Payer: Self-pay | Admitting: Physical Medicine and Rehabilitation

## 2022-01-02 DIAGNOSIS — M5416 Radiculopathy, lumbar region: Secondary | ICD-10-CM

## 2022-01-02 DIAGNOSIS — M48062 Spinal stenosis, lumbar region with neurogenic claudication: Secondary | ICD-10-CM

## 2022-01-09 ENCOUNTER — Encounter: Payer: Self-pay | Admitting: Adult Health

## 2022-01-10 NOTE — Telephone Encounter (Signed)
FYI

## 2022-01-31 DIAGNOSIS — M48062 Spinal stenosis, lumbar region with neurogenic claudication: Secondary | ICD-10-CM | POA: Diagnosis not present

## 2022-02-12 ENCOUNTER — Ambulatory Visit (INDEPENDENT_AMBULATORY_CARE_PROVIDER_SITE_OTHER): Payer: Medicare HMO | Admitting: Adult Health

## 2022-02-12 ENCOUNTER — Encounter: Payer: Self-pay | Admitting: Adult Health

## 2022-02-12 VITALS — BP 120/86 | HR 65 | Temp 98.1°F | Ht 73.5 in | Wt 185.0 lb

## 2022-02-12 DIAGNOSIS — E781 Pure hyperglyceridemia: Secondary | ICD-10-CM

## 2022-02-12 DIAGNOSIS — M48061 Spinal stenosis, lumbar region without neurogenic claudication: Secondary | ICD-10-CM

## 2022-02-12 DIAGNOSIS — Z0000 Encounter for general adult medical examination without abnormal findings: Secondary | ICD-10-CM | POA: Diagnosis not present

## 2022-02-12 DIAGNOSIS — F418 Other specified anxiety disorders: Secondary | ICD-10-CM | POA: Diagnosis not present

## 2022-02-12 DIAGNOSIS — R351 Nocturia: Secondary | ICD-10-CM | POA: Diagnosis not present

## 2022-02-12 DIAGNOSIS — N401 Enlarged prostate with lower urinary tract symptoms: Secondary | ICD-10-CM

## 2022-02-12 DIAGNOSIS — R69 Illness, unspecified: Secondary | ICD-10-CM | POA: Diagnosis not present

## 2022-02-12 LAB — CBC WITH DIFFERENTIAL/PLATELET
Basophils Absolute: 0.1 10*3/uL (ref 0.0–0.1)
Basophils Relative: 1.1 % (ref 0.0–3.0)
Eosinophils Absolute: 0.4 10*3/uL (ref 0.0–0.7)
Eosinophils Relative: 6.5 % — ABNORMAL HIGH (ref 0.0–5.0)
HCT: 41.9 % (ref 39.0–52.0)
Hemoglobin: 14.3 g/dL (ref 13.0–17.0)
Lymphocytes Relative: 28.1 % (ref 12.0–46.0)
Lymphs Abs: 1.6 10*3/uL (ref 0.7–4.0)
MCHC: 34 g/dL (ref 30.0–36.0)
MCV: 90.4 fl (ref 78.0–100.0)
Monocytes Absolute: 0.6 10*3/uL (ref 0.1–1.0)
Monocytes Relative: 9.5 % (ref 3.0–12.0)
Neutro Abs: 3.2 10*3/uL (ref 1.4–7.7)
Neutrophils Relative %: 54.8 % (ref 43.0–77.0)
Platelets: 230 10*3/uL (ref 150.0–400.0)
RBC: 4.64 Mil/uL (ref 4.22–5.81)
RDW: 13 % (ref 11.5–15.5)
WBC: 5.8 10*3/uL (ref 4.0–10.5)

## 2022-02-12 LAB — LIPID PANEL
Cholesterol: 156 mg/dL (ref 0–200)
HDL: 42.7 mg/dL (ref >39.00)
LDL Cholesterol: 94 mg/dL (ref 0–99)
NonHDL: 113.67
Total CHOL/HDL Ratio: 4
Triglycerides: 96 mg/dL (ref 0.0–149.0)
VLDL: 19.2 mg/dL (ref 0.0–40.0)

## 2022-02-12 LAB — COMPREHENSIVE METABOLIC PANEL
ALT: 15 U/L (ref 0–53)
AST: 24 U/L (ref 0–37)
Albumin: 4.7 g/dL (ref 3.5–5.2)
Alkaline Phosphatase: 118 U/L — ABNORMAL HIGH (ref 39–117)
BUN: 24 mg/dL — ABNORMAL HIGH (ref 6–23)
CO2: 28 mEq/L (ref 19–32)
Calcium: 10.1 mg/dL (ref 8.4–10.5)
Chloride: 104 mEq/L (ref 96–112)
Creatinine, Ser: 1.26 mg/dL (ref 0.40–1.50)
GFR: 55.36 mL/min — ABNORMAL LOW (ref >60.00)
Glucose, Bld: 85 mg/dL (ref 70–99)
Potassium: 4.6 mEq/L (ref 3.5–5.1)
Sodium: 141 mEq/L (ref 135–145)
Total Bilirubin: 0.8 mg/dL (ref 0.2–1.2)
Total Protein: 7.2 g/dL (ref 6.0–8.3)

## 2022-02-12 LAB — TSH: TSH: 2.36 u[IU]/mL (ref 0.35–5.50)

## 2022-02-12 LAB — PSA: PSA: 3.33 ng/mL (ref 0.10–4.00)

## 2022-02-12 MED ORDER — CLONAZEPAM 0.5 MG PO TABS: 0.2500 mg | ORAL_TABLET | Freq: Every evening | ORAL | 3 refills | Status: DC | PRN

## 2022-02-12 MED ORDER — GEMFIBROZIL 600 MG PO TABS: 600.0000 mg | ORAL_TABLET | Freq: Two times a day (BID) | ORAL | 3 refills | Status: DC

## 2022-02-12 NOTE — Patient Instructions (Signed)
It was great seeing you today   We will follow up with you regarding your lab work   Please let me know if you need anything   

## 2022-02-12 NOTE — Progress Notes (Signed)
Subjective:    Patient ID: Marcus Reynolds, male    DOB: 02-24-1946, 76 y.o.   MRN: [id number]  HPI Patient presents for yearly preventative medicine examination. He is a pleasant 76 year old male who  has a past medical history of Hyperlipidemia and Personal history of colonic polyps (12/25/2010).  Hypertriglyceridemia-prescribed Lopid 600 mg twice daily and aspirin 81 mg daily.  He denies myalgia or fatigue Lab Results  Component Value Date   CHOL 153 02/08/2021   HDL 40.30 02/08/2021   LDLCALC 88 02/08/2021   TRIG 120.0 02/08/2021   CHOLHDL 4 02/08/2021   BPH-asymptomatic  Travel Anxiety -we will use Klonopin 0.5 mg as needed when traveling.  Spinal Stenosis - has epidural injection in May 2023 and reports that this lasted about 3 weeks and then started to have symptoms again. He is now going to be see by [facility] and have a possible laminectomy in the future.    All immunizations and health maintenance protocols were reviewed with the patient and needed orders were placed.  Appropriate screening laboratory values were ordered for the patient including screening of hyperlipidemia, renal function and hepatic function. If indicated by BPH, a PSA was ordered.  Medication reconciliation,  past medical history, social history, problem list and allergies were reviewed in detail with the patient  Goals were established with regard to weight loss, exercise, and  diet in compliance with medications Wt Readings from Last 3 Encounters:  02/12/22 185 lb (83.9 kg)  08/07/21 187 lb (84.8 kg)  08/07/21 183 lb (83 kg)    Review of Systems  Constitutional: Negative.   HENT: Negative.    Eyes: Negative.   Respiratory: Negative.    Cardiovascular: Negative.   Gastrointestinal: Negative.   Endocrine: Negative.   Genitourinary: Negative.   Musculoskeletal:  Positive for back pain.  Skin: Negative.   Allergic/Immunologic: Negative.   Neurological: Negative.    Hematological: Negative.   Psychiatric/Behavioral: Negative.    All other systems reviewed and are negative.  Past Medical History:  Diagnosis Date   Hyperlipidemia    high tri   Personal history of colonic polyps 12/25/2010    Social History   Socioeconomic History   Marital status: Married    Spouse name: Not on file   Number of children: 2   Years of education: Not on file   Highest education level: Professional school degree (e.g., MD, DDS, DVM, JD)  Occupational History   Occupation: Retired  Tobacco Use   Smoking status: Never   Smokeless tobacco: Never  Vaping Use   Vaping Use: Never used  Substance and Sexual Activity   Alcohol use: Yes    Alcohol/week: 1.0 standard drink of alcohol    Types: 1 Glasses of wine per week    Comment: socially   Drug use: No   Sexual activity: Not on file  Other Topics Concern   Not on file  Social History Narrative   Retired   [facility] 2   Married    Volunteers at [employer]: Low Risk  (08/07/2021)   Overall Financial Resource Strain (CARDIA)    Difficulty of Paying Living Expenses: Not hard at all  Food Insecurity: No Food Insecurity (08/07/2021)   Hunger Vital Sign    Worried About Running Out of Food in the Last Year: Never true    Ran Out of Food in the Last Year: Never  true  Transportation Needs: No Transportation Needs (08/07/2021)   PRAPARE - Hydrologist (Medical): No    Lack of Transportation (Non-Medical): No  Physical Activity: Sufficiently Active (08/07/2021)   Exercise Vital Sign    Days of Exercise per Week: 5 days    Minutes of Exercise per Session: 60 min  Stress: No Stress Concern Present (08/07/2021)   [place]    Feeling of Stress : Not at all  Social Connections: [place] (08/07/2021)   Social Connection and Isolation Panel [NHANES]     Frequency of Communication with Friends and Family: Three times a week    Frequency of Social Gatherings with Friends and Family: Three times a week    Attends Religious Services: 1 to 4 times per year    Active Member of Clubs or Organizations: Yes    Attends Archivist Meetings: 1 to 4 times per year    Marital Status: Married  Human resources officer Violence: Not At Risk (08/07/2021)   Humiliation, Afraid, Rape, and Kick questionnaire    Fear of Current or Ex-Partner: No    Emotionally Abused: No    Physically Abused: No    Sexually Abused: No    Past Surgical History:  Procedure Laterality Date   CATARACT EXTRACTION Bilateral 10/2021   COLONOSCOPY  12/25/2010   internal hemorrhoids   COLONOSCOPY W/ POLYPECTOMY  06/10/2005   [place], [place]   INGUINAL HERNIA REPAIR     right side   TONSILECTOMY, ADENOIDECTOMY, BILATERAL MYRINGOTOMY AND TUBES      Family History  Problem Relation Age of Onset   Heart disease Mother    Diabetes Mother    Ulcerative colitis Mother    Colon cancer Neg Hx    Esophageal cancer Neg Hx    Rectal cancer Neg Hx    Stomach cancer Neg Hx     No Known Allergies  Current Outpatient Medications on File Prior to Visit  Medication Sig Dispense Refill   clonazePAM (KLONOPIN) 0.5 MG tablet Take 0.5-1 tablets (0.25-0.5 mg total) by mouth at bedtime as needed for anxiety. 15 tablet 0   gemfibrozil (LOPID) 600 MG tablet Take 1 tablet (600 mg total) by mouth 2 (two) times daily before a meal. 180 tablet 3   Multiple Vitamin (MULTIVITAMIN) tablet Take 1 tablet by mouth daily.     sildenafil (REVATIO) 20 MG tablet Take 1 to 2 tabs 2 - 3 hours before sex 30 tablet 11   No current facility-administered medications on file prior to visit.    BP 120/86   Pulse 65   Temp 98.1 F (36.7 C) (Oral)   Ht 6' 1.5" (1.867 m)   Wt 185 lb (83.9 kg)   SpO2 98%   BMI 24.08 kg/m       Objective:   Physical Exam Vitals and nursing note reviewed.   Constitutional:      General: He is not in acute distress.    Appearance: Normal appearance. He is well-developed and normal weight.  HENT:     Head: Normocephalic and atraumatic.     Right Ear: Tympanic membrane, ear canal and external ear normal. There is no impacted cerumen.     Left Ear: Tympanic membrane, ear canal and external ear normal. There is no impacted cerumen.     Nose: Nose normal. No congestion or rhinorrhea.     Mouth/Throat:     Mouth: Mucous membranes are  moist.     Pharynx: Oropharynx is clear. No oropharyngeal exudate or posterior oropharyngeal erythema.  Eyes:     General:        Right eye: No discharge.        Left eye: No discharge.     Extraocular Movements: Extraocular movements intact.     Conjunctiva/sclera: Conjunctivae normal.     Pupils: Pupils are equal, round, and reactive to light.  Neck:     Vascular: No carotid bruit.     Trachea: No tracheal deviation.  Cardiovascular:     Rate and Rhythm: Normal rate and regular rhythm.     Pulses: Normal pulses.     Heart sounds: Normal heart sounds. No murmur heard.    No friction rub. No gallop.  Pulmonary:     Effort: Pulmonary effort is normal. No respiratory distress.     Breath sounds: Normal breath sounds. No stridor. No wheezing, rhonchi or rales.  Chest:     Chest wall: No tenderness.  Abdominal:     General: Bowel sounds are normal. There is no distension.     Palpations: Abdomen is soft. There is no mass.     Tenderness: There is no abdominal tenderness. There is no right CVA tenderness, left CVA tenderness, guarding or rebound.     Hernia: No hernia is present.  Musculoskeletal:        General: No swelling, tenderness, deformity or signs of injury. Normal range of motion.     Right lower leg: No edema.     Left lower leg: No edema.  Lymphadenopathy:     Cervical: No cervical adenopathy.  Skin:    General: Skin is warm and dry.     Capillary Refill: Capillary refill takes less than 2  seconds.     Coloration: Skin is not jaundiced or pale.     Findings: No bruising, erythema, lesion or rash.  Neurological:     General: No focal deficit present.     Mental Status: He is alert and oriented to person, place, and time.     Cranial Nerves: No cranial nerve deficit.     Sensory: No sensory deficit.     Motor: No weakness.     Coordination: Coordination normal.     Gait: Gait normal.     Deep Tendon Reflexes: Reflexes normal.  Psychiatric:        Mood and Affect: Mood normal.        Behavior: Behavior normal.        Thought Content: Thought content normal.        Judgment: Judgment normal.       Assessment & Plan:  1. Routine general medical examination at a health care facility -Continue to eat healthy and exercise - Follow up in one year or sooner if needed - CBC with Differential/Platelet; Future - Comprehensive metabolic panel; Future - Lipid panel; Future - TSH; Future  2. HYPERTRIGLYCERIDEMIA  - CBC with Differential/Platelet; Future - Comprehensive metabolic panel; Future - Lipid panel; Future - TSH; Future - gemfibrozil (LOPID) 600 MG tablet; Take 1 tablet (600 mg total) by mouth 2 (two) times daily before a meal.  Dispense: 180 tablet; Refill: 3  3. BPH associated with nocturia  - PSA; Future  4. Situational anxiety  - clonazePAM (KLONOPIN) 0.5 MG tablet; Take 0.5-1 tablets (0.25-0.5 mg total) by mouth at bedtime as needed for anxiety.  Dispense: 15 tablet; Refill: 3  5. Spinal stenosis of lumbar region without neurogenic  claudication - Follow up with [facility]   Dorothyann Peng, NP

## 2022-02-13 ENCOUNTER — Encounter: Payer: Self-pay | Admitting: Adult Health

## 2022-02-15 DIAGNOSIS — D692 Other nonthrombocytopenic purpura: Secondary | ICD-10-CM | POA: Diagnosis not present

## 2022-02-15 DIAGNOSIS — Z85828 Personal history of other malignant neoplasm of skin: Secondary | ICD-10-CM | POA: Diagnosis not present

## 2022-02-15 DIAGNOSIS — L570 Actinic keratosis: Secondary | ICD-10-CM | POA: Diagnosis not present

## 2022-02-15 DIAGNOSIS — L820 Inflamed seborrheic keratosis: Secondary | ICD-10-CM | POA: Diagnosis not present

## 2022-02-15 DIAGNOSIS — D1801 Hemangioma of skin and subcutaneous tissue: Secondary | ICD-10-CM | POA: Diagnosis not present

## 2022-02-15 DIAGNOSIS — C44712 Basal cell carcinoma of skin of right lower limb, including hip: Secondary | ICD-10-CM | POA: Diagnosis not present

## 2022-02-15 DIAGNOSIS — L814 Other melanin hyperpigmentation: Secondary | ICD-10-CM | POA: Diagnosis not present

## 2022-03-10 HISTORY — PX: OTHER SURGICAL HISTORY: SHX169

## 2022-03-20 DIAGNOSIS — M48062 Spinal stenosis, lumbar region with neurogenic claudication: Secondary | ICD-10-CM | POA: Diagnosis not present

## 2022-03-20 DIAGNOSIS — M48061 Spinal stenosis, lumbar region without neurogenic claudication: Secondary | ICD-10-CM | POA: Diagnosis not present

## 2022-05-15 DIAGNOSIS — Z961 Presence of intraocular lens: Secondary | ICD-10-CM | POA: Diagnosis not present

## 2022-05-15 DIAGNOSIS — H43811 Vitreous degeneration, right eye: Secondary | ICD-10-CM | POA: Diagnosis not present

## 2022-05-15 DIAGNOSIS — H35371 Puckering of macula, right eye: Secondary | ICD-10-CM | POA: Diagnosis not present

## 2022-05-15 DIAGNOSIS — H25812 Combined forms of age-related cataract, left eye: Secondary | ICD-10-CM | POA: Diagnosis not present

## 2022-06-21 ENCOUNTER — Encounter: Payer: Self-pay | Admitting: Rehabilitative and Restorative Service Providers"

## 2022-06-21 ENCOUNTER — Other Ambulatory Visit: Payer: Self-pay

## 2022-06-21 ENCOUNTER — Ambulatory Visit: Payer: Medicare HMO | Attending: Neurological Surgery | Admitting: Rehabilitative and Restorative Service Providers"

## 2022-06-21 DIAGNOSIS — M6281 Muscle weakness (generalized): Secondary | ICD-10-CM | POA: Insufficient documentation

## 2022-06-21 DIAGNOSIS — R252 Cramp and spasm: Secondary | ICD-10-CM

## 2022-06-21 DIAGNOSIS — M545 Low back pain, unspecified: Secondary | ICD-10-CM | POA: Insufficient documentation

## 2022-06-21 DIAGNOSIS — R269 Unspecified abnormalities of gait and mobility: Secondary | ICD-10-CM | POA: Insufficient documentation

## 2022-06-21 DIAGNOSIS — M48062 Spinal stenosis, lumbar region with neurogenic claudication: Secondary | ICD-10-CM | POA: Diagnosis not present

## 2022-06-21 DIAGNOSIS — M5459 Other low back pain: Secondary | ICD-10-CM

## 2022-06-21 DIAGNOSIS — R2689 Other abnormalities of gait and mobility: Secondary | ICD-10-CM

## 2022-06-21 NOTE — Therapy (Signed)
OUTPATIENT PHYSICAL THERAPY THORACOLUMBAR EVALUATION   Patient Name: Marcus Reynolds MRN: [id number] DOB:05-Aug-1945, 77 y.o., male Today's Date: 06/21/2022  END OF SESSION:  PT End of Session - 06/21/22 1026     Visit Number 1    Date for PT Re-Evaluation 08/16/22    Authorization Type [employer]    Progress Note Due on Visit 10    PT Start Time 1015    PT Stop Time 1055    PT Time Calculation (min) 40 min    Activity Tolerance Patient tolerated treatment well    Behavior During Therapy [facility] for tasks assessed/performed             Past Medical History:  Diagnosis Date   Hyperlipidemia    high tri   Personal history of colonic polyps 12/25/2010   Past Surgical History:  Procedure Laterality Date   CATARACT EXTRACTION Bilateral 10/2021   COLONOSCOPY  12/25/2010   internal hemorrhoids   COLONOSCOPY W/ POLYPECTOMY  06/10/2005   [place], [place]   INGUINAL HERNIA REPAIR     right side   TONSILECTOMY, ADENOIDECTOMY, BILATERAL MYRINGOTOMY AND TUBES     Patient Active Problem List   Diagnosis Date Noted   Osteoarthritis of spine with radiculopathy, lumbar region 08/04/2017   Routine general medical examination at a health care facility 09/23/2013   ED (erectile dysfunction) 07/23/2011   History of colonic polyps 12/25/2010   HYPERTRIGLYCERIDEMIA 06/13/2009    PCP: Dorothyann Peng, NP  REFERRING PROVIDER: Eustace Moore, MD  REFERRING DIAG: [phone] (ICD-10-CM) - Spinal stenosis, lumbar region with neurogenic claudication  Rationale for Evaluation and Treatment: Rehabilitation  THERAPY DIAG:  Other low back pain - Plan: PT plan of care cert/re-cert  Other abnormalities of gait and mobility - Plan: PT plan of care cert/re-cert  Muscle weakness (generalized) - Plan: PT plan of care cert/re-cert  Cramp and spasm - Plan: PT plan of care cert/re-cert  ONSET DATE: 59/16/3846 s/p laminectomy L3-5  SUBJECTIVE:                                                                                                                                                                                            SUBJECTIVE STATEMENT: Pt reports that he initially had back pain and sciatica in 2019 and he underwent injections and PT with some improvement. In early 2023, patient again had pain come back and he tried injections and PT that were not as successful.  Following trip to [place], patient was having increased pain and was referred to Dr Ronnald Ramp.  Patient underwent a laminectomy on 03/20/22 and has since felt better, however, he does  not feel that he is at his max functional potential at this time, so he was referred back to PT to further progress.  PERTINENT HISTORY:  S/P laminectomy of L3-5, OA  PAIN:  Are you having pain? Yes: NPRS scale: 3/10 Pain location: low back Pain description: aching Aggravating factors: increased walking, trying to move after sitting for a long time Relieving factors: medication  PRECAUTIONS: Back  WEIGHT BEARING RESTRICTIONS: No  FALLS:  Has patient fallen in last 6 months? 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  NEXT MD VISIT:   OBJECTIVE:   DIAGNOSTIC FINDINGS:  Lumbar MRI on 12/28/2021:  IMPRESSION: 1. Mild progression of degenerative changes since prior MRI. 2. Severe spinal canal stenosis at L3-4. 3. Moderate spinal canal stenosis with moderate narrowing of the bilateral subarticular zones at L2-3. 4. Mild spinal canal stenosis with prominent narrowing of the bilateral subarticular zones at L4-5. Moderate right neural foraminal  narrowing at this level. 5. Moderate left neural foraminal narrowing at L5-S1. PATIENT SURVEYS:  Eval:  FOTO 63% (projected 70% by visit 10)  SCREENING FOR RED FLAGS: Bowel or bladder incontinence: No Spinal tumors: No Cauda equina syndrome: No Compression fracture: No Abdominal aneurysm: No  COGNITION: Overall cognitive status: Within functional limits for tasks assessed     SENSATION: Reports occasionally getting numbness in his feet  MUSCLE LENGTH: Hamstrings: tightness noted  POSTURE: rounded shoulders and forward head  PALPATION: No tenderness to palpation, but some palpable muscle spasms noted on lumbar paraspinals  LUMBAR ROM:   Per back precautions, but limited approx 25%  LOWER EXTREMITY ROM:     WFL  LOWER EXTREMITY MMT:    06/21/2022: Bilateral hip strength of 4/5 Bilateral hamstring strength of 4/5  LUMBAR SPECIAL TESTS:  Slump test: Positive slightly on right side  FUNCTIONAL TESTS:  5 times sit to stand: 13.9 sec without UE use  GAIT: Distance walked: 45 min Assistive device utilized: None Level of assistance: Complete Independence Comments: States that at 45 min ambulation, he starts noting increased pain/soreness and needs to go lie down  TODAY'S TREATMENT:                                                                                                                              DATE: 06/21/2022  Reviewed HEP and provided with updated HEP   PATIENT EDUCATION:  Education details: Issued HEP Person educated: Patient Education method: Explanation, Demonstration, and Handouts Education comprehension: verbalized understanding and returned demonstration  HOME EXERCISE PROGRAM: Access Code: [email or web address] URL: [email or web address] Date: 06/21/2022 Prepared by: Juel Burrow  Exercises -  Seated Cat Cow  - 1 x daily - 7 x weekly - 1 sets - 10 reps - Seated Table Hamstring Stretch  - 1 x daily - 7 x weekly - 1 sets - 5 reps - 10  hold - Thomas Stretch on Table  - 1 x daily - 7 x weekly - 1 sets - 2 reps - 60 hold - Standing Hip Abduction  - 1 x daily - 7 x weekly - 1 sets - 15 reps - Standing Hip Extension  - 1 x daily - 7 x weekly - 3 sets - 10 reps - Standing Hamstring Stretch on Chair  - 2 x daily - 7 x weekly - 1 sets - 3 reps - 30 sec hold - Standing Quad Stretch with Table and Chair Support  - 2 x daily - 7 x weekly - 1 sets - 3 reps - 30 sec hold - Supine Double Knee to Chest  - 1 x daily - 7 x weekly - 1 sets - 3 reps - 30 hold - Supine Posterior Pelvic Tilt  - 1 x daily - 7 x weekly - 1 sets - 20 reps - Supine Dead Bug with Leg Extension  - 1 x daily - 7 x weekly - 1 sets - 20 reps - Supine Figure 4 Piriformis Stretch  - 1 x daily - 7 x weekly - 1 sets - 3 reps - 20 sec hold - Sidelying Hip Abduction  - 1 x daily - 7 x weekly - 2 sets - 10 reps  ASSESSMENT:  CLINICAL IMPRESSION: Patient is a 77 y.o. male who was seen today for physical therapy evaluation and treatment for back pain s/p lumbar laminectomy on 03/20/2022.  Patient is known to this PT clinic from undergoing PT in early 2023 prior to his lumbar laminectomy.  Patient reports that since his surgery, his sciatica has improved, but he still has increased pain with increased ambulation and does not feel that he is at his PLOF.  Patient states that if he walks for 45 minutes, he has increased pain/soreness and needs to go lie down.  Patient presents with hip and hamstring weakness and decreased flexibility.  Patient with spasms noted in lumbar multifidi.  Patient would benefit from skilled PT to address his functional impairments to allow him to return to his active lifestyle without increased pain.  OBJECTIVE IMPAIRMENTS: difficulty walking, decreased ROM, decreased strength, increased muscle spasms, impaired flexibility, postural dysfunction, and pain.   ACTIVITY LIMITATIONS: lifting, bending, and squatting  PARTICIPATION LIMITATIONS: community  activity  PERSONAL FACTORS: Time since onset of injury/illness/exacerbation and 1-2 comorbidities: OA, s/p lumbar laminectomy on 03/20/22  are also affecting patient's functional outcome.   REHAB POTENTIAL: Good  CLINICAL DECISION MAKING: Stable/uncomplicated  EVALUATION COMPLEXITY: Low   GOALS: Goals reviewed with patient? Yes  SHORT TERM GOALS: Target date: 07/12/2022  Pt will be independent with initial HEP. Baseline: Goal status: INITIAL  2.  Patient will report at least a 30% improvement in symptoms since starting PT. Baseline:  Goal status: INITIAL   LONG TERM GOALS: Target date: 08/16/2022  Pt will be independent with advanced HEP. Baseline:  Goal status: INITIAL  2.  Patient will report ability to walk for at least an hour without increased pain to allow him to return to hiking. Baseline:  Goal status: INITIAL  3.  Patient will increase FOTO to at least 70% to demonstrate improvements in functional mobility. Baseline: 63% Goal status: INITIAL  4.  Patient will increase bilateral hip strength to at least 4+/5 to allow him increased ease with sit to stand from lower surfaces. Baseline:  Goal status: INITIAL  5.  Pt will be able to complete 5 times sit to/from stand assessment in 10 seconds or less to demonstrate improved functional use. Baseline: 13.9 sec Goal status: INITIAL   PLAN:  PT FREQUENCY: 2x/week  PT DURATION: 8 weeks  PLANNED INTERVENTIONS: Therapeutic exercises, Therapeutic activity, Neuromuscular re-education, Balance training, Gait training, Patient/Family education, Self Care, Joint mobilization, Joint manipulation, Stair training, Aquatic Therapy, Dry Needling, Electrical stimulation, Spinal manipulation, Spinal mobilization, Cryotherapy, scar mobilization, Taping, Traction, Ultrasound, Ionotophoresis '4mg'$ /ml Dexamethasone, Manual therapy, and Re-evaluation.  PLAN FOR NEXT SESSION: assess and progress HEP as indicated, strengthening, core  stability   Juel Burrow, PT 06/21/2022, 12:12 PM    [facility] [address], [place], [place] [address] Phone # [phone] Fax [phone]

## 2022-06-24 ENCOUNTER — Ambulatory Visit: Payer: Medicare HMO | Admitting: Physical Therapy

## 2022-06-24 DIAGNOSIS — R2689 Other abnormalities of gait and mobility: Secondary | ICD-10-CM

## 2022-06-24 DIAGNOSIS — R269 Unspecified abnormalities of gait and mobility: Secondary | ICD-10-CM | POA: Diagnosis not present

## 2022-06-24 DIAGNOSIS — M48062 Spinal stenosis, lumbar region with neurogenic claudication: Secondary | ICD-10-CM | POA: Diagnosis not present

## 2022-06-24 DIAGNOSIS — M6281 Muscle weakness (generalized): Secondary | ICD-10-CM

## 2022-06-24 DIAGNOSIS — R252 Cramp and spasm: Secondary | ICD-10-CM

## 2022-06-24 DIAGNOSIS — M5459 Other low back pain: Secondary | ICD-10-CM

## 2022-06-24 DIAGNOSIS — M545 Low back pain, unspecified: Secondary | ICD-10-CM | POA: Diagnosis not present

## 2022-06-24 NOTE — Therapy (Signed)
OUTPATIENT PHYSICAL THERAPY THORACOLUMBAR EVALUATION   Patient Name: Marcus Reynolds MRN: [id number] DOB:01-06-1946, 77 y.o., male Today's Date: 06/24/2022  END OF SESSION:  PT End of Session - 06/24/22 1445     Visit Number 2    Date for PT Re-Evaluation 08/16/22    Authorization Type [employer]    Progress Note Due on Visit 10    PT Start Time 1445    PT Stop Time 1524    PT Time Calculation (min) 39 min    Activity Tolerance Patient tolerated treatment well    Behavior During Therapy [facility] for tasks assessed/performed              Past Medical History:  Diagnosis Date   Hyperlipidemia    high tri   Personal history of colonic polyps 12/25/2010   Past Surgical History:  Procedure Laterality Date   CATARACT EXTRACTION Bilateral 10/2021   COLONOSCOPY  12/25/2010   internal hemorrhoids   COLONOSCOPY W/ POLYPECTOMY  06/10/2005   [place], [place]   INGUINAL HERNIA REPAIR     right side   TONSILECTOMY, ADENOIDECTOMY, BILATERAL MYRINGOTOMY AND TUBES     Patient Active Problem List   Diagnosis Date Noted   Osteoarthritis of spine with radiculopathy, lumbar region 08/04/2017   Routine general medical examination at a health care facility 09/23/2013   ED (erectile dysfunction) 07/23/2011   History of colonic polyps 12/25/2010   HYPERTRIGLYCERIDEMIA 06/13/2009    PCP: Dorothyann Peng, NP  REFERRING PROVIDER: Eustace Moore, MD  REFERRING DIAG: [id number] (ICD-10-CM) - Spinal stenosis, lumbar region with neurogenic claudication  Rationale for Evaluation and Treatment: Rehabilitation  THERAPY DIAG:  Other low back pain  Muscle weakness (generalized)  Other abnormalities of gait and mobility  Cramp and spasm  ONSET DATE: 03/20/2022 s/p laminectomy L3-5  SUBJECTIVE:                                                                                                                                                                                           SUBJECTIVE  STATEMENT: Pt states that   PERTINENT HISTORY:  S/P laminectomy of L3-5, OA  PAIN:  Are you having pain? Yes: NPRS scale: 3/10 Pain location: low back Pain description: aching Aggravating factors: increased walking, trying to move after sitting for a long time Relieving factors: medication  PRECAUTIONS: Back  WEIGHT BEARING RESTRICTIONS: No  FALLS:  Has patient fallen in last 6 months? 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  NEXT MD VISIT:   OBJECTIVE:   DIAGNOSTIC FINDINGS:  Lumbar MRI on 12/28/2021:  IMPRESSION: 1. Mild progression of degenerative changes since prior MRI. 2. Severe spinal canal stenosis at L3-4. 3. Moderate spinal canal stenosis with moderate narrowing of the bilateral subarticular zones at L2-3. 4. Mild spinal canal stenosis with prominent narrowing of the bilateral subarticular zones at L4-5. Moderate right neural foraminal narrowing at this level. 5. Moderate left neural foraminal narrowing at L5-S1. PATIENT SURVEYS:  Eval:  FOTO 63% (projected 70% by visit 10)  SCREENING FOR RED FLAGS: Bowel or bladder incontinence: No Spinal tumors: No Cauda equina syndrome: No Compression fracture: No Abdominal aneurysm: No  COGNITION: Overall cognitive status: Within functional limits for tasks assessed     SENSATION: Reports occasionally getting numbness in his feet  MUSCLE LENGTH: Hamstrings: tightness noted  POSTURE: rounded shoulders and forward head  PALPATION: No tenderness to palpation, but some palpable muscle spasms noted on lumbar  paraspinals  LUMBAR ROM:   Per back precautions, but limited approx 25%  LOWER EXTREMITY ROM:     WFL  LOWER EXTREMITY MMT:    06/21/2022: Bilateral hip strength of 4/5 Bilateral hamstring strength of 4/5  LUMBAR SPECIAL TESTS:  Slump test: Positive slightly on right side  FUNCTIONAL TESTS:  5 times sit to stand: 13.9 sec without UE use  GAIT: Distance walked: 45 min Assistive device utilized: None Level of assistance: Complete Independence Comments: States that at 45 min ambulation, he starts noting increased pain/soreness and needs to go lie down  TODAY'S TREATMENT:      Date: 06/24/2022:  Nustep lvl 5, 4 min Hip abduction/ extension standing at bar x15, bilat  PPT x15 Sideling hip abduction x15, bilat  HS stretch 2x30 sec  Figure 4 stretch 2x30 sec  SKTC 2x30 sec  Dead bug with alt UE/ LE x15 Sideling clams with GTB 1x10, bilat  Bridges with GTB with VC for pushing through heels to avoid LBP. X10  Squat at bar with chair for reference x15                                                                                                                           DATE: 06/21/2022  Reviewed HEP and provided with updated HEP   PATIENT EDUCATION:  Education details: Issued HEP Person educated: Patient Education method: Explanation, Demonstration, and Handouts Education comprehension: verbalized understanding and returned demonstration  HOME EXERCISE PROGRAM: Access Code: [email or web address] URL: [email or web address] Date: 06/21/2022 Prepared by: Shelby Dubin Menke  Exercises - Seated Cat Cow  - 1 x daily - 7 x weekly - 1 sets - 10 reps - Seated Table Hamstring Stretch  - 1 x daily - 7 x weekly - 1 sets - 5 reps - 10  hold - Advertising account planner on Table  - 1 x daily - 7 x weekly - 1 sets - 2 reps - 60 hold - Standing Hip Abduction  - 1 x daily - 7 x weekly - 1 sets - 15 reps - Standing Hip Extension  - 1 x daily - 7 x weekly - 3 sets - 10 reps - Standing Hamstring  Stretch on Chair  - 2 x daily - 7 x weekly - 1 sets - 3 reps - 30 sec hold - Standing Quad Stretch with Table and Chair Support  - 2 x daily - 7 x weekly - 1 sets - 3 reps - 30 sec hold - Supine Double Knee to Chest  - 1 x daily - 7 x weekly - 1 sets - 3 reps - 30 hold - Supine Posterior Pelvic Tilt  - 1 x daily - 7 x weekly - 1 sets - 20 reps - Supine Dead Bug with Leg Extension  - 1 x daily - 7 x weekly - 1 sets - 20 reps - Supine Figure 4 Piriformis Stretch  - 1 x daily - 7 x weekly - 1 sets - 3 reps - 20 sec hold - Sidelying Hip Abduction  - 1 x daily - 7 x weekly - 2 sets - 10 reps  ASSESSMENT:  CLINICAL IMPRESSION: Pt presents to PT with reports of intermittent compliance with his HEP. He demonstrated his current routine, with VC for proper forum and addition of standing hip extension, squats, clams and bridges. Pt reports no pain, but muscle fatigue with additional exercises today. Encouraged pt to pick 4-5 exercises a day to perform vs doing them all. Plan to continue to progress pt as tolerated. Pt will continue to benefit from skilled PT to address continued deficits.    OBJECTIVE IMPAIRMENTS: difficulty walking, decreased ROM, decreased strength, increased muscle spasms, impaired flexibility, postural dysfunction, and pain.   ACTIVITY LIMITATIONS: lifting, bending, and squatting  PARTICIPATION LIMITATIONS: community activity  PERSONAL FACTORS: Time since onset of injury/illness/exacerbation and 1-2 comorbidities: OA, s/p lumbar laminectomy on 03/20/22  are also affecting patient's functional outcome.   REHAB POTENTIAL: Good  CLINICAL DECISION MAKING: Stable/uncomplicated  EVALUATION COMPLEXITY: Low   GOALS: Goals reviewed with patient? Yes  SHORT TERM GOALS: Target date: 07/12/2022  Pt will be independent with initial HEP. Baseline: Goal status: INITIAL  2.  Patient will report at least a 30% improvement in symptoms since starting PT. Baseline:  Goal status:  INITIAL   LONG TERM GOALS: Target date: 08/16/2022  Pt will be independent with advanced HEP. Baseline:  Goal status: INITIAL  2.  Patient will report ability to walk for at least an hour without increased pain to allow him to return to hiking. Baseline:  Goal status: INITIAL  3.  Patient will increase FOTO to at least 70% to demonstrate improvements in functional mobility. Baseline: 63% Goal status: INITIAL  4.  Patient will increase bilateral hip strength to at least 4+/5 to allow him increased ease with sit to stand from lower surfaces. Baseline:  Goal status: INITIAL  5.  Pt will be able to complete 5 times sit to/from stand assessment in 10 seconds or less to demonstrate improved functional use. Baseline: 13.9 sec Goal status: INITIAL   PLAN:  PT FREQUENCY: 2x/week  PT DURATION: 8 weeks  PLANNED INTERVENTIONS: Therapeutic exercises, Therapeutic activity, Neuromuscular re-education, Balance training, Gait training, Patient/Family education, Self Care, Joint mobilization, Joint manipulation, Stair training, Aquatic Therapy, Dry Needling,  Electrical stimulation, Spinal manipulation, Spinal mobilization, Cryotherapy, scar mobilization, Taping, Traction, Ultrasound, Ionotophoresis '4mg'$ /ml Dexamethasone, Manual therapy, and Re-evaluation.  PLAN FOR NEXT SESSION: strengthening, core stability   Rudi Heap PT, DPT 06/24/22  4:13 PM

## 2022-07-03 ENCOUNTER — Encounter: Payer: Self-pay | Admitting: Rehabilitative and Restorative Service Providers"

## 2022-07-03 ENCOUNTER — Ambulatory Visit: Payer: Medicare HMO | Admitting: Rehabilitative and Restorative Service Providers"

## 2022-07-03 DIAGNOSIS — R252 Cramp and spasm: Secondary | ICD-10-CM

## 2022-07-03 DIAGNOSIS — R269 Unspecified abnormalities of gait and mobility: Secondary | ICD-10-CM | POA: Diagnosis not present

## 2022-07-03 DIAGNOSIS — M6281 Muscle weakness (generalized): Secondary | ICD-10-CM

## 2022-07-03 DIAGNOSIS — R2689 Other abnormalities of gait and mobility: Secondary | ICD-10-CM

## 2022-07-03 DIAGNOSIS — M545 Low back pain, unspecified: Secondary | ICD-10-CM | POA: Diagnosis not present

## 2022-07-03 DIAGNOSIS — M48062 Spinal stenosis, lumbar region with neurogenic claudication: Secondary | ICD-10-CM | POA: Diagnosis not present

## 2022-07-03 DIAGNOSIS — M5459 Other low back pain: Secondary | ICD-10-CM

## 2022-07-03 NOTE — Therapy (Signed)
OUTPATIENT PHYSICAL THERAPY TREATMENT NOTE   Patient Name: Marcus Reynolds MRN: [id number] DOB:05/29/1946, 77 y.o., male Today's Date: 07/03/2022  END OF SESSION:  PT End of Session - 07/03/22 1152     Visit Number 3    Date for PT Re-Evaluation 08/16/22    Authorization Type [employer]    Progress Note Due on Visit 10    PT Start Time 1148    PT Stop Time 1226    PT Time Calculation (min) 38 min    Activity Tolerance Patient tolerated treatment well    Behavior During Therapy [facility] for tasks assessed/performed              Past Medical History:  Diagnosis Date   Hyperlipidemia    high tri   Personal history of colonic polyps 12/25/2010   Past Surgical History:  Procedure Laterality Date   CATARACT EXTRACTION Bilateral 10/2021   COLONOSCOPY  12/25/2010   internal hemorrhoids   COLONOSCOPY W/ POLYPECTOMY  06/10/2005   [place], [place]   INGUINAL HERNIA REPAIR     right side   TONSILECTOMY, ADENOIDECTOMY, BILATERAL MYRINGOTOMY AND TUBES     Patient Active Problem List   Diagnosis Date Noted   Osteoarthritis of spine with radiculopathy, lumbar region 08/04/2017   Routine general medical examination at a health care facility 09/23/2013   ED (erectile dysfunction) 07/23/2011   History of colonic polyps 12/25/2010   HYPERTRIGLYCERIDEMIA 06/13/2009    PCP: Dorothyann Peng, NP  REFERRING PROVIDER: Eustace Moore, MD  REFERRING DIAG: [phone] (ICD-10-CM) - Spinal stenosis, lumbar region with neurogenic claudication  Rationale for Evaluation and Treatment: Rehabilitation  THERAPY DIAG:  Other low back pain  Muscle weakness (generalized)  Other abnormalities of gait and mobility  Cramp and spasm  ONSET DATE: 03/20/2022 s/p laminectomy L3-5  SUBJECTIVE:                                                                                                                                                                                           SUBJECTIVE STATEMENT: Pt  states that the clamshell with resistance was too difficult and he started doing the exercise without the band.  PERTINENT HISTORY:  S/P laminectomy of L3-5, OA  PAIN:  Are you having pain? Yes: NPRS scale: 3/10 Pain location: low back Pain description: aching Aggravating factors: increased walking, trying to move after sitting for a long time Relieving factors: medication  PRECAUTIONS: Back  WEIGHT BEARING RESTRICTIONS: No  FALLS:  Has patient fallen in last 6 months? 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  NEXT MD VISIT:   OBJECTIVE:   DIAGNOSTIC FINDINGS:  Lumbar MRI on 12/28/2021:  IMPRESSION: 1. Mild progression of degenerative changes since prior MRI. 2. Severe spinal canal stenosis at L3-4. 3. Moderate spinal canal stenosis with moderate narrowing of the bilateral subarticular zones at L2-3. 4. Mild spinal canal stenosis with prominent narrowing of the bilateral subarticular zones at L4-5. Moderate right neural foraminal narrowing at this level. 5. Moderate left neural foraminal narrowing at L5-S1.  PATIENT SURVEYS:  Eval:  FOTO 63% (projected 70% by visit 10)  SCREENING FOR RED FLAGS: Bowel or bladder incontinence: No Spinal tumors: No Cauda equina syndrome: No Compression fracture: No Abdominal aneurysm: No  COGNITION: Overall cognitive status: Within functional limits for tasks assessed     SENSATION: Reports occasionally getting numbness in his feet  MUSCLE LENGTH: Hamstrings: tightness noted  POSTURE: rounded shoulders and forward  head  PALPATION: No tenderness to palpation, but some palpable muscle spasms noted on lumbar paraspinals  LUMBAR ROM:   Per back precautions, but limited approx 25%  LOWER EXTREMITY ROM:     WFL  LOWER EXTREMITY MMT:    06/21/2022: Bilateral hip strength of 4/5 Bilateral hamstring strength of 4/5  LUMBAR SPECIAL TESTS:  Eval:  Slump test: Positive slightly on right side  FUNCTIONAL TESTS:  Eval:  5 times sit to stand: 13.9 sec without UE use  GAIT: Distance walked: 45 min Assistive device utilized: None Level of assistance: Complete Independence Comments: States that at 45 min ambulation, he starts noting increased pain/soreness and needs to go lie down  TODAY'S TREATMENT:       DATE: 06/21/2022  Seated hamstring 2x20 sec bilat Teaching of proper form with squat with sit to stand with limited pressure when going to "sit" position 2x10 Seated piriformis stretch 2x20 sec bilat Sidelying clamshells with yellow tband 2x10 Supine posterior pelvic tilt (PPT) 2x10 Supine PPT with marching 2x10 Supine dying bug 2x10 Supine bridging x10 Nustep level 5 x5 min with PT present to discuss status Seated with ball against back performing gentle thoracic extension x10   Date: 06/24/2022:  Nustep lvl 5, 4 min Hip abduction/ extension standing at bar x15, bilat  PPT x15 Sideling hip abduction x15, bilat  HS stretch 2x30 sec  Figure 4 stretch 2x30 sec  SKTC 2x30 sec  Dead bug with alt UE/ LE x15 Sideling clams with GTB 1x10, bilat  Bridges with GTB with VC for pushing through heels to avoid LBP. X10  Squat at bar with chair for reference x15  PATIENT EDUCATION:  Education details: Issued HEP Person educated: Patient Education method: Explanation, Demonstration, and Handouts Education comprehension: verbalized understanding and returned  demonstration  HOME EXERCISE PROGRAM: Access Code: [email or web address] URL: [email or web address] Date: 07/03/2022 Prepared by: Shelby Dubin Montie Gelardi  Exercises - Seated Cat Cow  - 1 x daily - 7 x weekly - 1 sets - 10 reps - Thomas Stretch on Table  - 1 x daily - 7 x weekly - 1 sets - 2 reps - 60 hold - Standing Hip Abduction  - 1 x daily - 7 x weekly - 1 sets - 15 reps - Standing Hip Extension  - 1 x daily - 7 x weekly - 3 sets - 10 reps - Standing Hamstring Stretch on Chair  - 2 x daily - 7 x weekly - 1 sets - 3 reps - 30 sec hold - Standing Quad Stretch with Table and Chair Support  - 2 x daily - 7 x weekly - 1 sets - 3 reps - 30 sec hold - Supine Double Knee to Chest  - 1 x daily - 7 x weekly - 1 sets - 3 reps - 30 hold - Supine Posterior Pelvic Tilt  - 1 x daily - 7 x weekly - 1 sets - 20 reps - Supine Dead Bug with Leg Extension  - 1 x daily - 7 x weekly - 1 sets - 20 reps - Supine Figure 4 Piriformis Stretch  - 1 x daily - 7 x weekly - 1 sets - 3 reps - 20 sec hold - Sidelying Hip Abduction  - 1 x daily - 7 x weekly - 2 sets - 10 reps - Clamshell with Resistance  - 1 x daily - 7 x weekly - 2 sets - 10 reps  ASSESSMENT:  CLINICAL IMPRESSION: Mr Greulich presents to skilled PT reporting that overall, he can tell that he is requiring less "recovery" time between exercises.  Patient admitted that he had been still performing his old exercises in addition to the sheet provided.  Patient educated that he does not need to perform some of the exercises that he was performing pre-surgery, as they may not be most appropriate for him now.  Pt verbalized understanding.  Patient provided with yellow theraband for clamshells and states that it felt a more appropriate resistance.  Patient provided with cuing throughout for improved technique and body mechanics.  Patient continues to require skilled PT to progress towards goal related activities and decreased pain.   OBJECTIVE IMPAIRMENTS: difficulty  walking, decreased ROM, decreased strength, increased muscle spasms, impaired flexibility, postural dysfunction, and pain.   ACTIVITY LIMITATIONS: lifting, bending, and squatting  PARTICIPATION LIMITATIONS: community activity  PERSONAL FACTORS: Time since onset of injury/illness/exacerbation and 1-2 comorbidities: OA, s/p lumbar laminectomy on 03/20/22  are also affecting patient's functional outcome.   REHAB POTENTIAL: Good  CLINICAL DECISION MAKING: Stable/uncomplicated  EVALUATION COMPLEXITY: Low   GOALS: Goals reviewed with patient? Yes  SHORT TERM GOALS: Target date: 07/12/2022  Pt will be independent with initial HEP. Baseline: Goal status: MET on 07/03/22  2.  Patient will report at least a 30% improvement in symptoms since starting PT. Baseline:  Goal status: INITIAL   LONG TERM GOALS: Target date: 08/16/2022  Pt will be independent with advanced HEP. Baseline:  Goal status: INITIAL  2.  Patient will report ability to walk for at least an hour without increased pain to allow him to return to hiking. Baseline:  Goal status: INITIAL  3.  Patient  will increase FOTO to at least 70% to demonstrate improvements in functional mobility. Baseline: 63% Goal status: INITIAL  4.  Patient will increase bilateral hip strength to at least 4+/5 to allow him increased ease with sit to stand from lower surfaces. Baseline:  Goal status: INITIAL  5.  Pt will be able to complete 5 times sit to/from stand assessment in 10 seconds or less to demonstrate improved functional use. Baseline: 13.9 sec Goal status: INITIAL   PLAN:  PT FREQUENCY: 2x/week  PT DURATION: 8 weeks  PLANNED INTERVENTIONS: Therapeutic exercises, Therapeutic activity, Neuromuscular re-education, Balance training, Gait training, Patient/Family education, Self Care, Joint mobilization, Joint manipulation, Stair training, Aquatic Therapy, Dry Needling, Electrical stimulation, Spinal manipulation, Spinal  mobilization, Cryotherapy, scar mobilization, Taping, Traction, Ultrasound, Ionotophoresis '4mg'$ /ml Dexamethasone, Manual therapy, and Re-evaluation.  PLAN FOR NEXT SESSION: assess and progress HEP as indicated, strengthening, core stability   Juel Burrow, PT, DPT 07/03/22  12:46 PM  [facility] [address], [place], [place] [address] Phone # [phone] Fax [phone]

## 2022-07-10 ENCOUNTER — Ambulatory Visit: Payer: Medicare HMO | Admitting: Rehabilitative and Restorative Service Providers"

## 2022-07-10 ENCOUNTER — Encounter: Payer: Self-pay | Admitting: Rehabilitative and Restorative Service Providers"

## 2022-07-10 DIAGNOSIS — M48062 Spinal stenosis, lumbar region with neurogenic claudication: Secondary | ICD-10-CM | POA: Diagnosis not present

## 2022-07-10 DIAGNOSIS — M6281 Muscle weakness (generalized): Secondary | ICD-10-CM | POA: Diagnosis not present

## 2022-07-10 DIAGNOSIS — M5459 Other low back pain: Secondary | ICD-10-CM

## 2022-07-10 DIAGNOSIS — R252 Cramp and spasm: Secondary | ICD-10-CM

## 2022-07-10 DIAGNOSIS — R2689 Other abnormalities of gait and mobility: Secondary | ICD-10-CM

## 2022-07-10 DIAGNOSIS — M545 Low back pain, unspecified: Secondary | ICD-10-CM | POA: Diagnosis not present

## 2022-07-10 DIAGNOSIS — R269 Unspecified abnormalities of gait and mobility: Secondary | ICD-10-CM | POA: Diagnosis not present

## 2022-07-10 NOTE — Therapy (Signed)
OUTPATIENT PHYSICAL THERAPY TREATMENT NOTE   Patient Name: Marcus Reynolds MRN: [id number] DOB:11/11/1945, 77 y.o., male Today's Date: 07/10/2022  END OF SESSION:  PT End of Session - 07/10/22 1102     Visit Number 4    Date for PT Re-Evaluation 08/16/22    Authorization Type [employer]    Progress Note Due on Visit 10    PT Start Time 1100    PT Stop Time 1140    PT Time Calculation (min) 40 min    Activity Tolerance Patient tolerated treatment well    Behavior During Therapy [facility] for tasks assessed/performed              Past Medical History:  Diagnosis Date   Hyperlipidemia    high tri   Personal history of colonic polyps 12/25/2010   Past Surgical History:  Procedure Laterality Date   CATARACT EXTRACTION Bilateral 10/2021   COLONOSCOPY  12/25/2010   internal hemorrhoids   COLONOSCOPY W/ POLYPECTOMY  06/10/2005   [place], [place]   INGUINAL HERNIA REPAIR     right side   TONSILECTOMY, ADENOIDECTOMY, BILATERAL MYRINGOTOMY AND TUBES     Patient Active Problem List   Diagnosis Date Noted   Osteoarthritis of spine with radiculopathy, lumbar region 08/04/2017   Routine general medical examination at a health care facility 09/23/2013   ED (erectile dysfunction) 07/23/2011   History of colonic polyps 12/25/2010   HYPERTRIGLYCERIDEMIA 06/13/2009    PCP: Dorothyann Peng, NP  REFERRING PROVIDER: Eustace Moore, MD  REFERRING DIAG: [phone] (ICD-10-CM) - Spinal stenosis, lumbar region with neurogenic claudication  Rationale for Evaluation and Treatment: Rehabilitation  THERAPY DIAG:  Other low back pain  Muscle weakness (generalized)  Other abnormalities of gait and mobility  Cramp and spasm  ONSET DATE: 03/20/2022 s/p laminectomy L3-5  SUBJECTIVE:                                                                                                                                                                                           SUBJECTIVE STATEMENT: Pt  reports that he is feeling "at least 50%" better since initial evaluation.  Patient states that he has been able to consistently ambulate for 3 miles in under an hour.  PERTINENT HISTORY:  S/P laminectomy of L3-5, OA  PAIN:  Are you having pain? Yes: NPRS scale: 1-2/10 Pain location: low back Pain description: aching Aggravating factors: increased walking, trying to move after sitting for a long time Relieving factors: medication  PRECAUTIONS: Back  WEIGHT BEARING RESTRICTIONS: No  FALLS:  Has patient fallen in last 6 months? 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  NEXT MD VISIT:   OBJECTIVE:   DIAGNOSTIC FINDINGS:  Lumbar MRI on 12/28/2021:  IMPRESSION: 1. Mild progression of degenerative changes since prior MRI. 2. Severe spinal canal stenosis at L3-4. 3. Moderate spinal canal stenosis with moderate narrowing of the bilateral subarticular zones at L2-3. 4. Mild spinal canal stenosis with prominent narrowing of the bilateral subarticular zones at L4-5. Moderate right neural foraminal narrowing at this level. 5. Moderate left neural foraminal narrowing at L5-S1.  PATIENT SURVEYS:  Eval:  FOTO 63% (projected 70% by visit 10)  SCREENING FOR RED FLAGS: Bowel or bladder incontinence: No Spinal tumors: No Cauda equina syndrome: No Compression fracture: No Abdominal aneurysm: No  COGNITION: Overall cognitive status: Within functional limits for tasks assessed     SENSATION: Reports occasionally getting numbness in his feet  MUSCLE LENGTH: Hamstrings: tightness  noted  POSTURE: rounded shoulders and forward head  PALPATION: No tenderness to palpation, but some palpable muscle spasms noted on lumbar paraspinals  LUMBAR ROM:   Per back precautions, but limited approx 25%  LOWER EXTREMITY ROM:     WFL  LOWER EXTREMITY MMT:    06/21/2022: Bilateral hip strength of 4/5 Bilateral hamstring strength of 4/5  LUMBAR SPECIAL TESTS:  Eval:  Slump test: Positive slightly on right side  FUNCTIONAL TESTS:  Eval:  5 times sit to stand: 13.9 sec without UE use  GAIT: Distance walked: 45 min Assistive device utilized: None Level of assistance: Complete Independence Comments: States that at 45 min ambulation, he starts noting increased pain/soreness and needs to go lie down  TODAY'S TREATMENT:       DATE: 07/10/2022  Nustep level 5 (blue machine seat at 12) x6 min with PT present to discuss status Sit to/from stand holding 5# kettlebell 2x10 Seated on blue pball:  marching, LAQ, pelvic tilt.  2x10 bilat (with PT behind ball supporting it to help patient maintain balance) Standing hamstring stretch at stairs 2x20 sec bilat Seated piriformis stretch 2x20 sec Supine dying bug 2x10 Sidelying clamshell with red tband 2x10 bilat Bridging 2x10   DATE: 06/21/2022  Seated hamstring 2x20 sec bilat Teaching of proper form with squat with sit to stand with limited pressure when going to "sit" position 2x10 Seated piriformis stretch 2x20 sec bilat Sidelying clamshells with yellow tband 2x10 Supine posterior pelvic tilt (PPT) 2x10 Supine PPT with marching 2x10 Supine dying bug 2x10 Supine bridging x10 Nustep level 5 x5 min with PT present to discuss status Seated with ball against back performing gentle thoracic extension x10   Date: 06/24/2022:  Nustep lvl 5, 4 min Hip abduction/ extension standing at bar x15, bilat  PPT x15 Sideling hip abduction x15, bilat  HS stretch 2x30 sec  Figure 4 stretch 2x30 sec  SKTC 2x30 sec  Dead bug with alt  UE/ LE x15 Sideling clams with GTB 1x10, bilat  Bridges with GTB with VC for pushing through heels to avoid LBP. X10  Squat at bar with chair for reference x15  PATIENT EDUCATION:  Education details: Issued HEP Person educated: Patient Education method: Explanation, Demonstration, and Handouts Education comprehension: verbalized understanding and returned demonstration  HOME EXERCISE PROGRAM: Access Code: [email or web address] URL: [email or web address] Date: 07/03/2022 Prepared by: Shelby Dubin Shahiem Bedwell  Exercises - Seated Cat Cow  - 1 x daily - 7 x weekly - 1 sets - 10 reps - Thomas Stretch on Table  - 1 x daily - 7 x weekly - 1 sets - 2 reps - 60 hold - Standing Hip Abduction  - 1 x daily - 7 x weekly - 1 sets - 15 reps - Standing Hip Extension  - 1 x daily - 7 x weekly - 3 sets - 10 reps - Standing Hamstring Stretch on Chair  - 2 x daily - 7 x weekly - 1 sets - 3 reps - 30 sec hold - Standing Quad Stretch with Table and Chair Support  - 2 x daily - 7 x weekly - 1 sets - 3 reps - 30 sec hold - Supine Double Knee to Chest  - 1 x daily - 7 x weekly - 1 sets - 3 reps - 30 hold - Supine Posterior Pelvic Tilt  - 1 x daily - 7 x weekly - 1 sets - 20 reps - Supine Dead Bug with Leg Extension  - 1 x daily - 7 x weekly - 1 sets - 20 reps - Supine Figure 4 Piriformis Stretch  - 1 x daily - 7 x weekly - 1 sets - 3 reps - 20 sec hold - Sidelying Hip Abduction  - 1 x daily - 7 x weekly - 2 sets - 10 reps - Clamshell with Resistance  - 1 x daily - 7 x weekly - 2 sets - 10 reps  ASSESSMENT:  CLINICAL IMPRESSION: Mr Carneiro presents to skilled PT reporting at least 50% improvement since starting skilled PT.  Patient is progressing with improved core stability.  Patient is requiring less cuing with technique and body mechanics.  Patient able to progress with red theraband with  clamshells and did not have the same pain/soreness that he did with the green theraband 2 visits ago.  States that using the yellow theraband at home has been much better.  Patient continues to require skilled PT to progress towards goal related activities.   OBJECTIVE IMPAIRMENTS: difficulty walking, decreased ROM, decreased strength, increased muscle spasms, impaired flexibility, postural dysfunction, and pain.   ACTIVITY LIMITATIONS: lifting, bending, and squatting  PARTICIPATION LIMITATIONS: community activity  PERSONAL FACTORS: Time since onset of injury/illness/exacerbation and 1-2 comorbidities: OA, s/p lumbar laminectomy on 03/20/22  are also affecting patient's functional outcome.   REHAB POTENTIAL: Good  CLINICAL DECISION MAKING: Stable/uncomplicated  EVALUATION COMPLEXITY: Low   GOALS: Goals reviewed with patient? Yes  SHORT TERM GOALS: Target date: 07/12/2022  Pt will be independent with initial HEP. Baseline: Goal status: MET on 07/03/22  2.  Patient will report at least a 30% improvement in symptoms since starting PT. Baseline:  Goal status: MET on 07/10/2022   LONG TERM GOALS: Target date: 08/16/2022  Pt will be independent with advanced HEP. Baseline:  Goal status: IN PROGRESS  2.  Patient will report ability to walk for at least an hour without increased pain to allow him to return to hiking. Baseline:  Goal status: IN PROGRESS  3.  Patient will increase FOTO to at least 70% to demonstrate improvements in functional mobility. Baseline: 63% Goal status: INITIAL  4.  Patient will increase bilateral hip strength  to at least 4+/5 to allow him increased ease with sit to stand from lower surfaces. Baseline:  Goal status: INITIAL  5.  Pt will be able to complete 5 times sit to/from stand assessment in 10 seconds or less to demonstrate improved functional use. Baseline: 13.9 sec Goal status: INITIAL   PLAN:  PT FREQUENCY: 2x/week  PT DURATION: 8  weeks  PLANNED INTERVENTIONS: Therapeutic exercises, Therapeutic activity, Neuromuscular re-education, Balance training, Gait training, Patient/Family education, Self Care, Joint mobilization, Joint manipulation, Stair training, Aquatic Therapy, Dry Needling, Electrical stimulation, Spinal manipulation, Spinal mobilization, Cryotherapy, scar mobilization, Taping, Traction, Ultrasound, Ionotophoresis '4mg'$ /ml Dexamethasone, Manual therapy, and Re-evaluation.  PLAN FOR NEXT SESSION: assess and progress HEP as indicated, strengthening, core stability   Juel Burrow, PT, DPT 07/10/22  12:40 PM  [facility] [address] [place], [place] [address] Phone # [phone] Fax [phone]

## 2022-07-18 ENCOUNTER — Encounter: Payer: Self-pay | Admitting: Physical Therapy

## 2022-07-18 ENCOUNTER — Ambulatory Visit: Payer: Medicare HMO | Attending: Neurological Surgery | Admitting: Physical Therapy

## 2022-07-18 DIAGNOSIS — R2689 Other abnormalities of gait and mobility: Secondary | ICD-10-CM | POA: Diagnosis not present

## 2022-07-18 DIAGNOSIS — R252 Cramp and spasm: Secondary | ICD-10-CM | POA: Insufficient documentation

## 2022-07-18 DIAGNOSIS — M5459 Other low back pain: Secondary | ICD-10-CM | POA: Insufficient documentation

## 2022-07-18 DIAGNOSIS — G8929 Other chronic pain: Secondary | ICD-10-CM | POA: Insufficient documentation

## 2022-07-18 DIAGNOSIS — M545 Low back pain, unspecified: Secondary | ICD-10-CM | POA: Diagnosis not present

## 2022-07-18 DIAGNOSIS — M6281 Muscle weakness (generalized): Secondary | ICD-10-CM | POA: Insufficient documentation

## 2022-07-18 NOTE — Therapy (Signed)
OUTPATIENT PHYSICAL THERAPY TREATMENT NOTE   Patient Name: Marcus Reynolds MRN: [id number] DOB:1946/02/18, 77 y.o., male Today's Date: 07/18/2022  END OF SESSION:  PT End of Session - 07/18/22 1019     Visit Number 5    Date for PT Re-Evaluation 08/16/22    Authorization Type [employer]    Progress Note Due on Visit 10    PT Start Time 1015    PT Stop Time 1057    PT Time Calculation (min) 42 min    Activity Tolerance Patient tolerated treatment well    Behavior During Therapy [facility] for tasks assessed/performed               Past Medical History:  Diagnosis Date   Hyperlipidemia    high tri   Personal history of colonic polyps 12/25/2010   Past Surgical History:  Procedure Laterality Date   CATARACT EXTRACTION Bilateral 10/2021   COLONOSCOPY  12/25/2010   internal hemorrhoids   COLONOSCOPY W/ POLYPECTOMY  06/10/2005   [place], [place]   INGUINAL HERNIA REPAIR     right side   TONSILECTOMY, ADENOIDECTOMY, BILATERAL MYRINGOTOMY AND TUBES     Patient Active Problem List   Diagnosis Date Noted   Osteoarthritis of spine with radiculopathy, lumbar region 08/04/2017   Routine general medical examination at a health care facility 09/23/2013   ED (erectile dysfunction) 07/23/2011   History of colonic polyps 12/25/2010   HYPERTRIGLYCERIDEMIA 06/13/2009    PCP: Dorothyann Peng, NP  REFERRING PROVIDER: Eustace Moore, MD  REFERRING DIAG: [phone] (ICD-10-CM) - Spinal stenosis, lumbar region with neurogenic claudication  Rationale for Evaluation and Treatment: Rehabilitation  THERAPY DIAG:  Other low back pain  Other abnormalities of gait and mobility  Muscle weakness (generalized)  Chronic bilateral low back pain without sciatica  Cramp and spasm  ONSET DATE: 03/20/2022 s/p laminectomy L3-5  SUBJECTIVE:                                                                                                                                                                                            SUBJECTIVE STATEMENT: Pt states that he continues to have a lower back ache after perfoming his 3 mile walks.   PERTINENT HISTORY:  S/P laminectomy of L3-5, OA  PAIN:  Are you having pain? Yes: NPRS scale: 1-2/10 Pain location: low back Pain description: aching Aggravating factors: increased walking, trying to move after sitting for a long time Relieving factors: medication  PRECAUTIONS: Back  WEIGHT BEARING RESTRICTIONS: No  FALLS:  Has patient fallen in last 6 months? 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  NEXT MD VISIT:   OBJECTIVE:   DIAGNOSTIC FINDINGS:  Lumbar MRI on 12/28/2021:  IMPRESSION: 1. Mild progression of degenerative changes since prior MRI. 2. Severe spinal canal stenosis at L3-4. 3. Moderate spinal canal stenosis with moderate narrowing of the bilateral subarticular zones at L2-3. 4. Mild spinal canal stenosis with prominent narrowing of the bilateral subarticular zones at L4-5. Moderate right neural foraminal narrowing at this level. 5. Moderate left neural foraminal narrowing at L5-S1.  PATIENT SURVEYS:  Eval:  FOTO 63% (projected 70% by visit 10)  SCREENING FOR RED FLAGS: Bowel or bladder incontinence: No Spinal tumors: No Cauda equina syndrome: No Compression fracture: No Abdominal aneurysm: No  COGNITION: Overall cognitive status: Within functional limits for tasks assessed     SENSATION: Reports occasionally getting numbness in his feet  MUSCLE LENGTH: Hamstrings: tightness noted  POSTURE: rounded shoulders and  forward head  PALPATION: No tenderness to palpation, but some palpable muscle spasms noted on lumbar paraspinals  LUMBAR ROM:   Per back precautions, but limited approx 25%  LOWER EXTREMITY ROM:     WFL  LOWER EXTREMITY MMT:    06/21/2022: Bilateral hip strength of 4/5 Bilateral hamstring strength of 4/5  LUMBAR SPECIAL TESTS:  Eval:  Slump test: Positive slightly on right side  FUNCTIONAL TESTS:  Eval:  5 times sit to stand: 13.9 sec without UE use  GAIT: Distance walked: 45 min Assistive device utilized: None Level of assistance: Complete Independence Comments: States that at 45 min ambulation, he starts noting increased pain/soreness and needs to go lie down  TODAY'S TREATMENT:     Date 07/18/2022 Nustep level 5x 21mn with PT present to discuss status Sit to/from stand holding 10# kettlebell 2x10 Seated on blue pball:  marching, LAQ, pelvic tilt.  2x10 bilat (with PT behind ball supporting it to help patient maintain balance) Standing hamstring stretch at stairs 2x20 sec bilat Seated piriformis stretch 2x20 sec Supine dying bug 2x10 Standing multifidi press out with 10# weight X10 bilat Bridging 2x10 (cues to perform TrA contraction and dig heels into mat prior to bridge due to reports of LBP).   DATE: 07/10/2022  Nustep level 5 (blue machine seat at 12) x6 min with PT present to discuss status Sit to/from stand holding 5# kettlebell 2x10 Seated on blue pball:  marching, LAQ, pelvic tilt.  2x10 bilat (with PT behind ball supporting it to help patient maintain balance) Standing hamstring stretch at stairs 2x20 sec bilat Seated piriformis stretch 2x20 sec Supine dying bug 2x10 Sidelying clamshell with red tband 2x10 bilat Bridging 2x10   DATE: 06/21/2022  Seated hamstring 2x20 sec bilat Teaching of proper form with squat with sit to stand with limited pressure when going to "sit" position 2x10 Seated piriformis stretch 2x20 sec bilat Sidelying clamshells with  yellow tband 2x10 Supine posterior pelvic tilt (PPT) 2x10 Supine PPT with marching 2x10 Supine dying bug 2x10 Supine bridging x10 Nustep level 5 x5 min with PT present to discuss status Seated with ball against back performing gentle thoracic extension x10   Date: 06/24/2022:  Nustep lvl 5, 4 min Hip abduction/ extension standing at bar x15, bilat  PPT x15 Sideling hip abduction x15, bilat  HS stretch 2x30 sec  Figure 4 stretch 2x30 sec  SKTC 2x30 sec  Dead bug with alt UE/ LE x15 Sideling clams with GTB 1x10, bilat  Bridges with GTB with VC for pushing through heels to avoid LBP. X10  Squat at bar with chair for reference x15                                                                                                                             PATIENT EDUCATION:  Education details: Issued HEP Person educated: Patient Education method: Explanation, Demonstration, and Handouts Education comprehension: verbalized understanding and returned demonstration  HOME EXERCISE PROGRAM: Access Code: [email or web address] URL: [email or web address] Date: 07/03/2022 Prepared by: Shelby Dubin Menke  Exercises - Seated Cat Cow  - 1 x daily - 7 x weekly - 1 sets - 10 reps - Thomas Stretch on Table  - 1 x daily - 7 x weekly - 1 sets - 2 reps - 60 hold - Standing Hip Abduction  - 1 x daily - 7 x weekly - 1 sets - 15 reps - Standing Hip Extension  - 1 x daily - 7 x weekly - 3 sets - 10 reps - Standing Hamstring Stretch on Chair  - 2 x daily - 7 x weekly - 1 sets - 3 reps - 30 sec hold - Standing Quad Stretch with Table and Chair Support  - 2 x daily - 7 x weekly - 1 sets - 3 reps - 30 sec hold - Supine Double Knee to Chest  - 1 x daily - 7 x weekly - 1 sets - 3 reps - 30 hold - Supine Posterior Pelvic Tilt  - 1 x daily - 7 x weekly - 1 sets - 20 reps - Supine Dead Bug with Leg Extension  - 1 x daily - 7 x weekly - 1 sets - 20 reps - Supine Figure 4 Piriformis Stretch  - 1 x daily - 7 x  weekly - 1 sets - 3 reps - 20 sec hold - Sidelying Hip Abduction  - 1 x daily - 7 x weekly - 2 sets - 10 reps - Clamshell with Resistance  - 1 x daily - 7 x weekly - 2 sets - 10 reps  ASSESSMENT:  CLINICAL IMPRESSION: Mr Mccarry presents to skilled PT reporting continued improvements. He notes the most lower back pain "ache" after walking 3 miles. Discussed importance of incorporating core more into daily routine. Continued with progressions made last session with core stability with addition of multifidi press outs. Pt reports improvements with balance on physioball today. He demonstrates improved forum with exercises, but has continued fatigue requiring rest breaks. Patient continues to require skilled PT to progress towards goal related activities.   OBJECTIVE IMPAIRMENTS: difficulty walking, decreased ROM, decreased strength, increased muscle spasms, impaired flexibility, postural dysfunction, and pain.   ACTIVITY LIMITATIONS: lifting, bending, and squatting  PARTICIPATION LIMITATIONS: community  activity  PERSONAL FACTORS: Time since onset of injury/illness/exacerbation and 1-2 comorbidities: OA, s/p lumbar laminectomy on 03/20/22  are also affecting patient's functional outcome.   REHAB POTENTIAL: Good  CLINICAL DECISION MAKING: Stable/uncomplicated  EVALUATION COMPLEXITY: Low   GOALS: Goals reviewed with patient? Yes  SHORT TERM GOALS: Target date: 07/12/2022  Pt will be independent with initial HEP. Baseline: Goal status: MET on 07/03/22  2.  Patient will report at least a 30% improvement in symptoms since starting PT. Baseline:  Goal status: MET on 07/10/2022   LONG TERM GOALS: Target date: 08/16/2022  Pt will be independent with advanced HEP. Baseline:  Goal status: IN PROGRESS  2.  Patient will report ability to walk for at least an hour without increased pain to allow him to return to hiking. Baseline:  Goal status: IN PROGRESS  3.  Patient will increase FOTO to at  least 70% to demonstrate improvements in functional mobility. Baseline: 63% Goal status: INITIAL  4.  Patient will increase bilateral hip strength to at least 4+/5 to allow him increased ease with sit to stand from lower surfaces. Baseline:  Goal status: INITIAL  5.  Pt will be able to complete 5 times sit to/from stand assessment in 10 seconds or less to demonstrate improved functional use. Baseline: 13.9 sec Goal status: INITIAL   PLAN:  PT FREQUENCY: 2x/week  PT DURATION: 8 weeks  PLANNED INTERVENTIONS: Therapeutic exercises, Therapeutic activity, Neuromuscular re-education, Balance training, Gait training, Patient/Family education, Self Care, Joint mobilization, Joint manipulation, Stair training, Aquatic Therapy, Dry Needling, Electrical stimulation, Spinal manipulation, Spinal mobilization, Cryotherapy, scar mobilization, Taping, Traction, Ultrasound, Ionotophoresis '4mg'$ /ml Dexamethasone, Manual therapy, and Re-evaluation.  PLAN FOR NEXT SESSION: Continue with core stability. Start session on treadmill to mimic walking to assess posture.    Rudi Heap PT, DPT 07/18/22  11:01 AM

## 2022-07-22 ENCOUNTER — Ambulatory Visit: Payer: Medicare HMO | Admitting: Rehabilitative and Restorative Service Providers"

## 2022-07-22 ENCOUNTER — Encounter: Payer: Self-pay | Admitting: Rehabilitative and Restorative Service Providers"

## 2022-07-22 DIAGNOSIS — M545 Low back pain, unspecified: Secondary | ICD-10-CM | POA: Diagnosis not present

## 2022-07-22 DIAGNOSIS — M5459 Other low back pain: Secondary | ICD-10-CM | POA: Diagnosis not present

## 2022-07-22 DIAGNOSIS — R2689 Other abnormalities of gait and mobility: Secondary | ICD-10-CM | POA: Diagnosis not present

## 2022-07-22 DIAGNOSIS — G8929 Other chronic pain: Secondary | ICD-10-CM | POA: Diagnosis not present

## 2022-07-22 DIAGNOSIS — M6281 Muscle weakness (generalized): Secondary | ICD-10-CM | POA: Diagnosis not present

## 2022-07-22 DIAGNOSIS — R252 Cramp and spasm: Secondary | ICD-10-CM

## 2022-07-22 NOTE — Therapy (Signed)
OUTPATIENT PHYSICAL THERAPY TREATMENT NOTE   Patient Name: Marcus Reynolds MRN: [id number] DOB:Jun 05, 1946, 77 y.o., male Today's Date: 07/22/2022  END OF SESSION:  PT End of Session - 07/22/22 1234     Visit Number 6    Date for PT Re-Evaluation 08/16/22    Authorization Type [employer]    Progress Note Due on Visit 10    PT Start Time 1230    PT Stop Time 1310    PT Time Calculation (min) 40 min    Activity Tolerance Patient tolerated treatment well    Behavior During Therapy [facility] for tasks assessed/performed               Past Medical History:  Diagnosis Date   Hyperlipidemia    high tri   Personal history of colonic polyps 12/25/2010   Past Surgical History:  Procedure Laterality Date   CATARACT EXTRACTION Bilateral 10/2021   COLONOSCOPY  12/25/2010   internal hemorrhoids   COLONOSCOPY W/ POLYPECTOMY  06/10/2005   [place], [place]   INGUINAL HERNIA REPAIR     right side   TONSILECTOMY, ADENOIDECTOMY, BILATERAL MYRINGOTOMY AND TUBES     Patient Active Problem List   Diagnosis Date Noted   Osteoarthritis of spine with radiculopathy, lumbar region 08/04/2017   Routine general medical examination at a health care facility 09/23/2013   ED (erectile dysfunction) 07/23/2011   History of colonic polyps 12/25/2010   HYPERTRIGLYCERIDEMIA 06/13/2009    PCP: Dorothyann Peng, NP  REFERRING PROVIDER: Eustace Moore, MD  REFERRING DIAG: [phone] (ICD-10-CM) - Spinal stenosis, lumbar region with neurogenic claudication  Rationale for Evaluation and Treatment: Rehabilitation  THERAPY DIAG:  Other low back pain  Other abnormalities of gait and mobility  Muscle weakness (generalized)  Cramp and spasm  ONSET DATE: 03/20/2022 s/p laminectomy L3-5  SUBJECTIVE:                                                                                                                                                                                           SUBJECTIVE STATEMENT: Pt  states that he and his wife got a little carried away with the walk on Saturday and walked for 1.5 hours (greater than his normal 3 miles) and he states that the following day, he had a lot of increased soreness.  Patient states that he is feeling a little better today.  PERTINENT HISTORY:  S/P laminectomy of L3-5, OA  PAIN:  Are you having pain? Yes: NPRS scale: 3/10 Pain location: low back Pain description: aching Aggravating factors: increased walking, trying to move after sitting for a long time Relieving factors: medication  PRECAUTIONS:  Back  WEIGHT BEARING RESTRICTIONS: No  FALLS:  Has patient fallen in last 6 months? 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  NEXT MD VISIT:   OBJECTIVE:   DIAGNOSTIC FINDINGS:  Lumbar MRI on 12/28/2021:  IMPRESSION: 1. Mild progression of degenerative changes since prior MRI. 2. Severe spinal canal stenosis at L3-4. 3. Moderate spinal canal stenosis with moderate narrowing of the bilateral subarticular zones at L2-3. 4. Mild spinal canal stenosis with prominent narrowing of the bilateral subarticular zones at L4-5. Moderate right neural foraminal narrowing at this level. 5. Moderate left neural foraminal narrowing at L5-S1.  PATIENT SURVEYS:  Eval:  FOTO 63% (projected 70% by visit 10)  SCREENING FOR RED FLAGS: Bowel or bladder incontinence: No Spinal tumors: No Cauda equina syndrome: No Compression fracture: No Abdominal aneurysm: No  COGNITION: Overall cognitive status: Within functional limits for tasks  assessed     SENSATION: Reports occasionally getting numbness in his feet  MUSCLE LENGTH: Hamstrings: tightness noted  POSTURE: rounded shoulders and forward head  PALPATION: No tenderness to palpation, but some palpable muscle spasms noted on lumbar paraspinals  LUMBAR ROM:   Per back precautions, but limited approx 25%  LOWER EXTREMITY ROM:     WFL  LOWER EXTREMITY MMT:    06/21/2022: Bilateral hip strength of 4/5 Bilateral hamstring strength of 4/5  LUMBAR SPECIAL TESTS:  Eval:  Slump test: Positive slightly on right side  FUNCTIONAL TESTS:  Eval:  5 times sit to stand: 13.9 sec without UE use  GAIT: Distance walked: 45 min Assistive device utilized: None Level of assistance: Complete Independence Comments: States that at 45 min ambulation, he starts noting increased pain/soreness and needs to go lie down  TODAY'S TREATMENT:      DATE: 07/22/2022  Recumbent bike level 2 x6 min with PT present to discuss status Seated piriformis stretch 2x20 sec Sit to/from stand holding 10# kettlebell:  x10 with chest press, x10 with overhead press Farmers carry holding 10# kettlebell walking down PT gym hallway down and back, one lap with weight in each hand Seated on blue pball:  marching, LAQ, 4 way pelvic tilt.  2x10 bilat (with PT behind ball supporting it to help patient maintain balance) Standing blue pball large ball bounce with forward step x10 bilat Side stepping with ball throw to PT and back 2x10 ft bilat Standing across body chop with 10# cable pulley x10 bilat Standing hamstring stretch at stairs 2x20 sec bilat Counter "L" stretch x20 sec   Date 07/18/2022 Nustep level 5x 37mn with PT present to discuss status Sit to/from stand holding 10# kettlebell 2x10 Seated on blue pball:  marching, LAQ, pelvic tilt.  2x10 bilat (with PT behind ball supporting it to help patient maintain balance) Standing hamstring stretch at stairs 2x20 sec bilat Seated piriformis stretch  2x20 sec Supine dying bug 2x10 Standing multifidi press out with 10# weight X10 bilat Bridging 2x10 (cues to perform TrA contraction and dig heels into mat prior to bridge due to reports of LBP).   DATE: 07/10/2022  Nustep level 5 (blue machine seat at 12) x6 min with PT present to  discuss status Sit to/from stand holding 5# kettlebell 2x10 Seated on blue pball:  marching, LAQ, pelvic tilt.  2x10 bilat (with PT behind ball supporting it to help patient maintain balance) Standing hamstring stretch at stairs 2x20 sec bilat Seated piriformis stretch 2x20 sec Supine dying bug 2x10 Sidelying clamshell with red tband 2x10 bilat Bridging 2x10                                                                                                                              PATIENT EDUCATION:  Education details: Issued HEP Person educated: Patient Education method: Explanation, Demonstration, and Handouts Education comprehension: verbalized understanding and returned demonstration  HOME EXERCISE PROGRAM: Access Code: [email or web address] URL: [email or web address] Date: 07/03/2022 Prepared by: Shelby Dubin Capers Hagmann  Exercises - Seated Cat Cow  - 1 x daily - 7 x weekly - 1 sets - 10 reps - Thomas Stretch on Table  - 1 x daily - 7 x weekly - 1 sets - 2 reps - 60 hold - Standing Hip Abduction  - 1 x daily - 7 x weekly - 1 sets - 15 reps - Standing Hip Extension  - 1 x daily - 7 x weekly - 3 sets - 10 reps - Standing Hamstring Stretch on Chair  - 2 x daily - 7 x weekly - 1 sets - 3 reps - 30 sec hold - Standing Quad Stretch with Table and Chair Support  - 2 x daily - 7 x weekly - 1 sets - 3 reps - 30 sec hold - Supine Double Knee to Chest  - 1 x daily - 7 x weekly - 1 sets - 3 reps - 30 hold - Supine Posterior Pelvic Tilt  - 1 x daily - 7 x weekly - 1 sets - 20 reps - Supine Dead Bug with Leg Extension  - 1 x daily - 7 x weekly - 1 sets - 20 reps - Supine Figure 4 Piriformis Stretch  - 1 x daily - 7 x  weekly - 1 sets - 3 reps - 20 sec hold - Sidelying Hip Abduction  - 1 x daily - 7 x weekly - 2 sets - 10 reps - Clamshell with Resistance  - 1 x daily - 7 x weekly - 2 sets - 10 reps  ASSESSMENT:  CLINICAL IMPRESSION: Mr Holway presents to skilled PT reporting some increased soreness from walking too much on Saturday.  Patient is progressing with increased functional strength activities and able to add in a functional carry during ambulation.  Patient able to progress with dynamic balance in standing exercises during session.   OBJECTIVE IMPAIRMENTS: difficulty walking, decreased ROM, decreased strength, increased muscle spasms, impaired flexibility, postural dysfunction, and pain.   ACTIVITY LIMITATIONS: lifting, bending, and squatting  PARTICIPATION LIMITATIONS: community activity  PERSONAL FACTORS: Time since onset of injury/illness/exacerbation and 1-2 comorbidities: OA, s/p lumbar laminectomy on 03/20/22  are also affecting patient's functional outcome.   REHAB POTENTIAL: Good  CLINICAL DECISION MAKING: Stable/uncomplicated  EVALUATION COMPLEXITY: Low   GOALS: Goals reviewed with patient? Yes  SHORT TERM GOALS: Target date: 07/12/2022  Pt will be independent with initial HEP. Baseline: Goal status: MET on 07/03/22  2.  Patient will report at least a 30% improvement in symptoms since starting PT. Baseline:  Goal status: MET on 07/10/2022   LONG TERM GOALS: Target date: 08/16/2022  Pt will be independent with advanced HEP. Baseline:  Goal status: IN PROGRESS  2.  Patient will report ability to walk for at least an hour without increased pain to allow him to return to hiking. Baseline:  Goal status: IN PROGRESS  3.  Patient will increase FOTO to at least 70% to demonstrate improvements in functional mobility. Baseline: 63% Goal status: INITIAL  4.  Patient will increase bilateral hip strength to at least 4+/5 to allow him increased ease with sit to stand from lower  surfaces. Baseline:  Goal status: INITIAL  5.  Pt will be able to complete 5 times sit to/from stand assessment in 10 seconds or less to demonstrate improved functional use. Baseline: 13.9 sec Goal status: INITIAL   PLAN:  PT FREQUENCY: 2x/week  PT DURATION: 8 weeks  PLANNED INTERVENTIONS: Therapeutic exercises, Therapeutic activity, Neuromuscular re-education, Balance training, Gait training, Patient/Family education, Self Care, Joint mobilization, Joint manipulation, Stair training, Aquatic Therapy, Dry Needling, Electrical stimulation, Spinal manipulation, Spinal mobilization, Cryotherapy, scar mobilization, Taping, Traction, Ultrasound, Ionotophoresis 18m/ml Dexamethasone, Manual therapy, and Re-evaluation.  PLAN FOR NEXT SESSION: Continue with core stability. Progress strengthening, functional gait   SJuel BurrowPT, DPT 07/22/22  1:26 PM  [facility] [address] [place] [place] [place] [address]Phone # [phone] [phone]

## 2022-07-24 ENCOUNTER — Ambulatory Visit: Payer: Medicare HMO | Admitting: Rehabilitative and Restorative Service Providers"

## 2022-07-24 ENCOUNTER — Encounter: Payer: Self-pay | Admitting: Rehabilitative and Restorative Service Providers"

## 2022-07-24 DIAGNOSIS — G8929 Other chronic pain: Secondary | ICD-10-CM | POA: Diagnosis not present

## 2022-07-24 DIAGNOSIS — M6281 Muscle weakness (generalized): Secondary | ICD-10-CM | POA: Diagnosis not present

## 2022-07-24 DIAGNOSIS — M5459 Other low back pain: Secondary | ICD-10-CM

## 2022-07-24 DIAGNOSIS — R2689 Other abnormalities of gait and mobility: Secondary | ICD-10-CM

## 2022-07-24 DIAGNOSIS — R252 Cramp and spasm: Secondary | ICD-10-CM | POA: Diagnosis not present

## 2022-07-24 DIAGNOSIS — M545 Low back pain, unspecified: Secondary | ICD-10-CM | POA: Diagnosis not present

## 2022-07-24 NOTE — Therapy (Signed)
OUTPATIENT PHYSICAL THERAPY TREATMENT NOTE   Patient Name: Marcus Reynolds MRN: [id number] DOB:1945/11/25, 77 y.o., male Today's Date: 07/24/2022  END OF SESSION:  PT End of Session - 07/24/22 1233     Visit Number 7    Date for PT Re-Evaluation 08/16/22    Authorization Type [employer] Medicare    Progress Note Due on Visit 10    PT Start Time 1228    PT Stop Time 1306    PT Time Calculation (min) 38 min    Activity Tolerance Patient tolerated treatment well    Behavior During Therapy [facility] for tasks assessed/performed               Past Medical History:  Diagnosis Date   Hyperlipidemia    high tri   Personal history of colonic polyps 12/25/2010   Past Surgical History:  Procedure Laterality Date   CATARACT EXTRACTION Bilateral 10/2021   COLONOSCOPY  12/25/2010   internal hemorrhoids   COLONOSCOPY W/ POLYPECTOMY  06/10/2005   [place], [place]   INGUINAL HERNIA REPAIR     right side   TONSILECTOMY, ADENOIDECTOMY, BILATERAL MYRINGOTOMY AND TUBES     Patient Active Problem List   Diagnosis Date Noted   Osteoarthritis of spine with radiculopathy, lumbar region 08/04/2017   Routine general medical examination at a health care facility 09/23/2013   ED (erectile dysfunction) 07/23/2011   History of colonic polyps 12/25/2010   HYPERTRIGLYCERIDEMIA 06/13/2009    PCP: Dorothyann Peng, NP  REFERRING PROVIDER: Eustace Moore, MD  REFERRING DIAG: [phone] (ICD-10-CM) - Spinal stenosis, lumbar region with neurogenic claudication  Rationale for Evaluation and Treatment: Rehabilitation  THERAPY DIAG:  Other low back pain  Other abnormalities of gait and mobility  Muscle weakness (generalized)  Cramp and spasm  ONSET DATE: 03/20/2022 s/p laminectomy L3-5  SUBJECTIVE:                                                                                                                                                                                           SUBJECTIVE STATEMENT: Pt  reports that he is feeling better today.  States that he did a 45 minute walk yesterday with his wife and he did not have any increased pain.  PERTINENT HISTORY:  S/P laminectomy of L3-5, OA  PAIN:  Are you having pain? Yes: NPRS scale: 1/10 Pain location: low back Pain description: aching Aggravating factors: increased walking, trying to move after sitting for a long time Relieving factors: medication  PRECAUTIONS: Back  WEIGHT BEARING RESTRICTIONS: No  FALLS:  Has patient fallen in last 6 months? 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  NEXT MD VISIT:   OBJECTIVE:   DIAGNOSTIC FINDINGS:  Lumbar MRI on 12/28/2021:  IMPRESSION: 1. Mild progression of degenerative changes since prior MRI. 2. Severe spinal canal stenosis at L3-4. 3. Moderate spinal canal stenosis with moderate narrowing of the bilateral subarticular zones at L2-3. 4. Mild spinal canal stenosis with prominent narrowing of the bilateral subarticular zones at L4-5. Moderate right neural foraminal narrowing at this level. 5. Moderate left neural foraminal narrowing at L5-S1.  PATIENT SURVEYS:  Eval:  FOTO 63% (projected 70% by visit 10) 07/24/2022:  FOTO 72%  SCREENING FOR RED FLAGS: Bowel or bladder incontinence: No Spinal tumors: No Cauda equina syndrome: No Compression fracture: No Abdominal aneurysm: No  COGNITION: Overall cognitive status: Within functional limits for tasks assessed     SENSATION: Reports occasionally getting numbness in his feet  MUSCLE LENGTH: Hamstrings: tightness noted  POSTURE:  rounded shoulders and forward head  PALPATION: No tenderness to palpation, but some palpable muscle spasms noted on lumbar paraspinals  LUMBAR ROM:   Per back precautions, but limited approx 25%  LOWER EXTREMITY ROM:     WFL  LOWER EXTREMITY MMT:    06/21/2022: Bilateral hip strength of 4/5 Bilateral hamstring strength of 4/5  LUMBAR SPECIAL TESTS:  Eval:  Slump test: Positive slightly on right side  FUNCTIONAL TESTS:  Eval:  5 times sit to stand: 13.9 sec without UE use  GAIT: Distance walked: 45 min Assistive device utilized: None Level of assistance: Complete Independence Comments: States that at 45 min ambulation, he starts noting increased pain/soreness and needs to go lie down  TODAY'S TREATMENT:      DATE: 07/24/2022  Recumbent bike level 3 x6 min with PT present to discuss status Seated piriformis stretch 2x20 sec Sit to/from stand holding 10# kettlebell:  x10 with chest press, x10 with overhead press Seated core series with 10# kettlebell:  hip to hip, hip to shoulder.  X10 each Standing single leg RDL with 10# kettlebell with support of barre x10 bilat Modified dead lift with 10# kettlebell x10 Standing hamstring stretch at stairs 2x20 sec bilat Wall squat 2x10   DATE: 07/22/2022  Recumbent bike level 2 x6 min with PT present to discuss status Seated piriformis stretch 2x20 sec Sit to/from stand holding 10# kettlebell:  x10 with chest press, x10 with overhead press Farmers carry holding 10# kettlebell walking down PT gym hallway down and back, one lap with weight in each hand Seated on blue pball:  marching, LAQ, 4 way pelvic tilt.  2x10 bilat (with PT behind ball supporting it to help patient maintain balance) Standing blue pball large ball bounce with forward step x10 bilat Side stepping with ball throw to PT and back 2x10 ft bilat Standing across body chop with 10# cable pulley x10 bilat Standing hamstring stretch at stairs 2x20 sec bilat Counter "L"  stretch x20 sec   Date 07/18/2022 Nustep level 5x 4mn with PT present to discuss status Sit to/from stand holding 10# kettlebell 2x10 Seated on blue pball:  marching, LAQ, pelvic tilt.  2x10 bilat (with PT behind ball supporting it to help patient maintain balance) Standing  hamstring stretch at stairs 2x20 sec bilat Seated piriformis stretch 2x20 sec Supine dying bug 2x10 Standing multifidi press out with 10# weight X10 bilat Bridging 2x10 (cues to perform TrA contraction and dig heels into mat prior to bridge due to reports of LBP).                                                                                                                             PATIENT EDUCATION:  Education details: Issued HEP Person educated: Patient Education method: Explanation, Demonstration, and Handouts Education comprehension: verbalized understanding and returned demonstration  HOME EXERCISE PROGRAM: Access Code: [email or web address] URL: [email or web address] Date: 07/03/2022 Prepared by: Shelby Dubin Aadarsh Cozort  Exercises - Seated Cat Cow  - 1 x daily - 7 x weekly - 1 sets - 10 reps - Thomas Stretch on Table  - 1 x daily - 7 x weekly - 1 sets - 2 reps - 60 hold - Standing Hip Abduction  - 1 x daily - 7 x weekly - 1 sets - 15 reps - Standing Hip Extension  - 1 x daily - 7 x weekly - 3 sets - 10 reps - Standing Hamstring Stretch on Chair  - 2 x daily - 7 x weekly - 1 sets - 3 reps - 30 sec hold - Standing Quad Stretch with Table and Chair Support  - 2 x daily - 7 x weekly - 1 sets - 3 reps - 30 sec hold - Supine Double Knee to Chest  - 1 x daily - 7 x weekly - 1 sets - 3 reps - 30 hold - Supine Posterior Pelvic Tilt  - 1 x daily - 7 x weekly - 1 sets - 20 reps - Supine Dead Bug with Leg Extension  - 1 x daily - 7 x weekly - 1 sets - 20 reps - Supine Figure 4 Piriformis Stretch  - 1 x daily - 7 x weekly - 1 sets - 3 reps - 20 sec hold - Sidelying Hip Abduction  - 1 x daily - 7 x weekly - 2 sets - 10  reps - Clamshell with Resistance  - 1 x daily - 7 x weekly - 2 sets - 10 reps  ASSESSMENT:  CLINICAL IMPRESSION: Mr Rohm presents to skilled PT reporting no increased pain following 45 minute walk with his wife.  Patient has improved FOTO goal and has surpassed the projected goal.  Patient is able to progress with strengthening exercises during session.  Reported some muscle soreness following dead lift exercises, so limited to one set.  Patient requires cuing for technique with wall squat exercise.  Pt continues to require skilled PT to progress towards goal related activities.   OBJECTIVE IMPAIRMENTS: difficulty walking, decreased ROM, decreased strength, increased muscle spasms, impaired flexibility, postural dysfunction, and pain.   ACTIVITY LIMITATIONS: lifting, bending, and squatting  PARTICIPATION LIMITATIONS: community activity  PERSONAL FACTORS: Time since onset of injury/illness/exacerbation and 1-2 comorbidities: OA, s/p lumbar  laminectomy on 03/20/22  are also affecting patient's functional outcome.   REHAB POTENTIAL: Good  CLINICAL DECISION MAKING: Stable/uncomplicated  EVALUATION COMPLEXITY: Low   GOALS: Goals reviewed with patient? Yes  SHORT TERM GOALS: Target date: 07/12/2022  Pt will be independent with initial HEP. Baseline: Goal status: MET on 07/03/22  2.  Patient will report at least a 30% improvement in symptoms since starting PT. Baseline:  Goal status: MET on 07/10/2022   LONG TERM GOALS: Target date: 08/16/2022  Pt will be independent with advanced HEP. Baseline:  Goal status: IN PROGRESS  2.  Patient will report ability to walk for at least an hour without increased pain to allow him to return to hiking. Baseline:  Goal status: IN PROGRESS  3.  Patient will increase FOTO to at least 70% to demonstrate improvements in functional mobility. Baseline: 63% Goal status: MET on 07/24/2022  4.  Patient will increase bilateral hip strength to at least  4+/5 to allow him increased ease with sit to stand from lower surfaces. Baseline:  Goal status: INITIAL  5.  Pt will be able to complete 5 times sit to/from stand assessment in 10 seconds or less to demonstrate improved functional use. Baseline: 13.9 sec Goal status: INITIAL   PLAN:  PT FREQUENCY: 2x/week  PT DURATION: 8 weeks  PLANNED INTERVENTIONS: Therapeutic exercises, Therapeutic activity, Neuromuscular re-education, Balance training, Gait training, Patient/Family education, Self Care, Joint mobilization, Joint manipulation, Stair training, Aquatic Therapy, Dry Needling, Electrical stimulation, Spinal manipulation, Spinal mobilization, Cryotherapy, scar mobilization, Taping, Traction, Ultrasound, Ionotophoresis 58m/ml Dexamethasone, Manual therapy, and Re-evaluation.  PLAN FOR NEXT SESSION: Continue with core stability. Progress strengthening, functional gait   SJuel BurrowPT, DPT 07/24/22  1:15 PM  [facility] [address] [place] [address]Phone # [phone] [phone]

## 2022-07-29 ENCOUNTER — Encounter: Payer: Self-pay | Admitting: Rehabilitative and Restorative Service Providers"

## 2022-07-29 ENCOUNTER — Ambulatory Visit: Payer: Medicare HMO | Admitting: Rehabilitative and Restorative Service Providers"

## 2022-07-29 DIAGNOSIS — M545 Low back pain, unspecified: Secondary | ICD-10-CM | POA: Diagnosis not present

## 2022-07-29 DIAGNOSIS — M5459 Other low back pain: Secondary | ICD-10-CM | POA: Diagnosis not present

## 2022-07-29 DIAGNOSIS — R2689 Other abnormalities of gait and mobility: Secondary | ICD-10-CM | POA: Diagnosis not present

## 2022-07-29 DIAGNOSIS — R252 Cramp and spasm: Secondary | ICD-10-CM

## 2022-07-29 DIAGNOSIS — M6281 Muscle weakness (generalized): Secondary | ICD-10-CM

## 2022-07-29 DIAGNOSIS — G8929 Other chronic pain: Secondary | ICD-10-CM | POA: Diagnosis not present

## 2022-07-29 NOTE — Therapy (Signed)
OUTPATIENT PHYSICAL THERAPY TREATMENT NOTE   Patient Name: Marcus Reynolds MRN: [id number] DOB:[birth year], 77 y.o., male Today's Date: 07/29/2022  END OF SESSION:  PT End of Session - 07/29/22 1231     Visit Number 8    Date for PT Re-Evaluation 08/16/22    Authorization Type [employer]    Progress Note Due on Visit 10    PT Start Time 1228    PT Stop Time 1306    PT Time Calculation (min) 38 min    Activity Tolerance Patient tolerated treatment well    Behavior During Therapy [facility] for tasks assessed/performed               Past Medical History:  Diagnosis Date   Hyperlipidemia    high tri   Personal history of colonic polyps 12/25/2010   Past Surgical History:  Procedure Laterality Date   CATARACT EXTRACTION Bilateral 10/2021   COLONOSCOPY  12/25/2010   internal hemorrhoids   COLONOSCOPY W/ POLYPECTOMY  06/10/2005   [place], [place]   INGUINAL HERNIA REPAIR     right side   TONSILECTOMY, ADENOIDECTOMY, BILATERAL MYRINGOTOMY AND TUBES     Patient Active Problem List   Diagnosis Date Noted   Osteoarthritis of spine with radiculopathy, lumbar region 08/04/2017   Routine general medical examination at a health care facility 09/23/2013   ED (erectile dysfunction) 07/23/2011   History of colonic polyps 12/25/2010   HYPERTRIGLYCERIDEMIA 06/13/2009    PCP: Dorothyann Peng, NP  REFERRING PROVIDER: Eustace Moore, MD  REFERRING DIAG: [phone] (ICD-10-CM) - Spinal stenosis, lumbar region with neurogenic claudication  Rationale for Evaluation and Treatment: Rehabilitation  THERAPY DIAG:  Other low back pain  Other abnormalities of gait and mobility  Muscle weakness (generalized)  Cramp and spasm  ONSET DATE: 03/20/2022 s/p laminectomy L3-5  SUBJECTIVE:                                                                                                                                                                                           SUBJECTIVE STATEMENT: Pt  reports that he walked a 45 min unlevel trail with his wife over the weekend and had some soreness, but is feeling better.  PERTINENT HISTORY:  S/P laminectomy of L3-5, OA  PAIN:  Are you having pain? Yes: NPRS scale: 1/10 Pain location: low back Pain description: aching Aggravating factors: increased walking, trying to move after sitting for a long time Relieving factors: medication  PRECAUTIONS: Back  WEIGHT BEARING RESTRICTIONS: No  FALLS:  Has patient fallen in last 6 months? 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  NEXT MD VISIT:   OBJECTIVE:   DIAGNOSTIC FINDINGS:  Lumbar MRI on 12/28/2021:  IMPRESSION: 1. Mild progression of degenerative changes since prior MRI. 2. Severe spinal canal stenosis at L3-4. 3. Moderate spinal canal stenosis with moderate narrowing of the bilateral subarticular zones at L2-3. 4. Mild spinal canal stenosis with prominent narrowing of the bilateral subarticular zones at L4-5. Moderate right neural foraminal narrowing at this level. 5. Moderate left neural foraminal narrowing at L5-S1.  PATIENT SURVEYS:  Eval:  FOTO 63% (projected 70% by visit 10) 07/24/2022:  FOTO 72%  SCREENING FOR RED FLAGS: Bowel or bladder incontinence: No Spinal tumors: No Cauda equina syndrome: No Compression fracture: No Abdominal aneurysm: No  COGNITION: Overall cognitive status: Within functional limits for tasks assessed     SENSATION: Reports occasionally getting numbness in his feet  MUSCLE LENGTH: Hamstrings: tightness noted  POSTURE: rounded shoulders  and forward head  PALPATION: No tenderness to palpation, but some palpable muscle spasms noted on lumbar paraspinals  LUMBAR ROM:   Per back precautions, but limited approx 25%  LOWER EXTREMITY ROM:     WFL  LOWER EXTREMITY MMT:    06/21/2022: Bilateral hip strength of 4/5 Bilateral hamstring strength of 4/5  LUMBAR SPECIAL TESTS:  Eval:  Slump test: Positive slightly on right side  FUNCTIONAL TESTS:  Eval:  5 times sit to stand: 13.9 sec without UE use  GAIT: Distance walked: 45 min Assistive device utilized: None Level of assistance: Complete Independence Comments: States that at 45 min ambulation, he starts noting increased pain/soreness and needs to go lie down  TODAY'S TREATMENT:      DATE: 07/29/2022  Nustep level 5 x6 min with PT present to discuss status Standing hamstring stretch at stairs 2x20 sec bilat Wall squat 2x10 Seated core series with 10# kettlebell:  hip to hip, hip to shoulder.  X10 each Seated piriformis stretch 2x20 sec Seated green pball roll-out 5x10 sec Supine bridging 2x10 Supine clamshell with yellow loop 2x10 bilat Supine marching with posterior pelvic tilt with yellow loop around the knees 2x10   DATE: 07/24/2022  Recumbent bike level 3 x6 min with PT present to discuss status Seated piriformis stretch 2x20 sec Sit to/from stand holding 10# kettlebell:  x10 with chest press, x10 with overhead press Seated core series with 10# kettlebell:  hip to hip, hip to shoulder.  X10 each Standing single leg RDL with 10# kettlebell with support of barre x10 bilat Modified dead lift with 10# kettlebell x10 Standing hamstring stretch at stairs 2x20 sec bilat Wall squat 2x10 Sit to/from stand holding 10# kettlebell:  x10 with chest press, x10 with overhead press   DATE: 07/22/2022  Recumbent bike level 2 x6 min with PT present to discuss status Seated piriformis stretch 2x20 sec Sit to/from stand holding 10# kettlebell:  x10 with chest press, x10  with overhead press Farmers carry holding 10# kettlebell walking down PT gym hallway down and back, one lap with weight in each hand Seated on blue pball:  marching, LAQ, 4 way pelvic tilt.  2x10 bilat (with PT behind ball supporting it to help patient maintain balance) Standing blue pball large  ball bounce with forward step x10 bilat Side stepping with ball throw to PT and back 2x10 ft bilat Standing across body chop with 10# cable pulley x10 bilat Standing hamstring stretch at stairs 2x20 sec bilat Counter "L" stretch x20 sec                                                                                                                        PATIENT EDUCATION:  Education details: Issued HEP Person educated: Patient Education method: Explanation, Demonstration, and Handouts Education comprehension: verbalized understanding and returned demonstration  HOME EXERCISE PROGRAM: Access Code: [email or web address] URL: [email or web address] Date: 07/03/2022 Prepared by: Shelby Dubin Loyed Wilmes  Exercises - Seated Cat Cow  - 1 x daily - 7 x weekly - 1 sets - 10 reps - Thomas Stretch on Table  - 1 x daily - 7 x weekly - 1 sets - 2 reps - 60 hold - Standing Hip Abduction  - 1 x daily - 7 x weekly - 1 sets - 15 reps - Standing Hip Extension  - 1 x daily - 7 x weekly - 3 sets - 10 reps - Standing Hamstring Stretch on Chair  - 2 x daily - 7 x weekly - 1 sets - 3 reps - 30 sec hold - Standing Quad Stretch with Table and Chair Support  - 2 x daily - 7 x weekly - 1 sets - 3 reps - 30 sec hold - Supine Double Knee to Chest  - 1 x daily - 7 x weekly - 1 sets - 3 reps - 30 hold - Supine Posterior Pelvic Tilt  - 1 x daily - 7 x weekly - 1 sets - 20 reps - Supine Dead Bug with Leg Extension  - 1 x daily - 7 x weekly - 1 sets - 20 reps - Supine Figure 4 Piriformis Stretch  - 1 x daily - 7 x weekly - 1 sets - 3 reps - 20 sec hold - Sidelying Hip Abduction  - 1 x daily - 7 x weekly - 2 sets - 10 reps - Clamshell  with Resistance  - 1 x daily - 7 x weekly - 2 sets - 10 reps  ASSESSMENT:  CLINICAL IMPRESSION: Mr Kivett presents to skilled PT reporting some soreness following last PT session.  Patient tolerated supine mat exercises during PT session with less pain/soreness reported following.  Continued to progress core stability and strengthening during exercises and patient is reporting that he has noted that he is able to stand up with less difficulty.  Patient continues to progress towards goal related activities and improved activity tolerance.   OBJECTIVE IMPAIRMENTS: difficulty walking, decreased ROM, decreased strength, increased muscle spasms, impaired flexibility, postural dysfunction, and pain.   ACTIVITY LIMITATIONS: lifting, bending, and squatting  PARTICIPATION LIMITATIONS: community activity  PERSONAL FACTORS: Time since onset of injury/illness/exacerbation and 1-2 comorbidities: OA, s/p lumbar laminectomy on 03/20/22  are also affecting patient's functional outcome.   REHAB POTENTIAL: Good  CLINICAL  DECISION MAKING: Stable/uncomplicated  EVALUATION COMPLEXITY: Low   GOALS: Goals reviewed with patient? Yes  SHORT TERM GOALS: Target date: 07/12/2022  Pt will be independent with initial HEP. Baseline: Goal status: MET on 07/03/22  2.  Patient will report at least a 30% improvement in symptoms since starting PT. Baseline:  Goal status: MET on 07/10/2022   LONG TERM GOALS: Target date: 08/16/2022  Pt will be independent with advanced HEP. Baseline:  Goal status: IN PROGRESS  2.  Patient will report ability to walk for at least an hour without increased pain to allow him to return to hiking. Baseline:  Goal status: IN PROGRESS  3.  Patient will increase FOTO to at least 70% to demonstrate improvements in functional mobility. Baseline: 63% Goal status: MET on 07/24/2022  4.  Patient will increase bilateral hip strength to at least 4+/5 to allow him increased ease with sit to  stand from lower surfaces. Baseline:  Goal status: INITIAL  5.  Pt will be able to complete 5 times sit to/from stand assessment in 10 seconds or less to demonstrate improved functional use. Baseline: 13.9 sec Goal status: INITIAL   PLAN:  PT FREQUENCY: 2x/week  PT DURATION: 8 weeks  PLANNED INTERVENTIONS: Therapeutic exercises, Therapeutic activity, Neuromuscular re-education, Balance training, Gait training, Patient/Family education, Self Care, Joint mobilization, Joint manipulation, Stair training, Aquatic Therapy, Dry Needling, Electrical stimulation, Spinal manipulation, Spinal mobilization, Cryotherapy, scar mobilization, Taping, Traction, Ultrasound, Ionotophoresis 37m/ml Dexamethasone, Manual therapy, and Re-evaluation.  PLAN FOR NEXT SESSION: Continue with core stability. Progress strengthening, functional gait   SJuel BurrowPT, DPT 07/29/22  1:19 PM  [facility] [address] [place] [address]Phone # [phone] [phone]

## 2022-08-02 ENCOUNTER — Encounter: Payer: Self-pay | Admitting: Rehabilitative and Restorative Service Providers"

## 2022-08-02 ENCOUNTER — Ambulatory Visit: Payer: Medicare HMO | Admitting: Rehabilitative and Restorative Service Providers"

## 2022-08-02 DIAGNOSIS — M6281 Muscle weakness (generalized): Secondary | ICD-10-CM

## 2022-08-02 DIAGNOSIS — R2689 Other abnormalities of gait and mobility: Secondary | ICD-10-CM | POA: Diagnosis not present

## 2022-08-02 DIAGNOSIS — M545 Low back pain, unspecified: Secondary | ICD-10-CM | POA: Diagnosis not present

## 2022-08-02 DIAGNOSIS — G8929 Other chronic pain: Secondary | ICD-10-CM | POA: Diagnosis not present

## 2022-08-02 DIAGNOSIS — R252 Cramp and spasm: Secondary | ICD-10-CM | POA: Diagnosis not present

## 2022-08-02 DIAGNOSIS — M5459 Other low back pain: Secondary | ICD-10-CM | POA: Diagnosis not present

## 2022-08-02 NOTE — Therapy (Signed)
OUTPATIENT PHYSICAL THERAPY TREATMENT NOTE   Patient Name: Marcus Reynolds MRN: [id number] DOB:1946-04-15, 77 y.o., male Today's Date: 08/02/2022  END OF SESSION:  PT End of Session - 08/02/22 0935     Visit Number 9    Date for PT Re-Evaluation 08/16/22    Authorization Type [employer] Medicare    Progress Note Due on Visit 10    PT Start Time 0930    PT Stop Time 1010    PT Time Calculation (min) 40 min    Activity Tolerance Patient tolerated treatment well    Behavior During Therapy [facility] for tasks assessed/performed               Past Medical History:  Diagnosis Date   Hyperlipidemia    high tri   Personal history of colonic polyps 12/25/2010   Past Surgical History:  Procedure Laterality Date   CATARACT EXTRACTION Bilateral 10/2021   COLONOSCOPY  12/25/2010   internal hemorrhoids   COLONOSCOPY W/ POLYPECTOMY  06/10/2005   [place], [place]   INGUINAL HERNIA REPAIR     right side   TONSILECTOMY, ADENOIDECTOMY, BILATERAL MYRINGOTOMY AND TUBES     Patient Active Problem List   Diagnosis Date Noted   Osteoarthritis of spine with radiculopathy, lumbar region 08/04/2017   Routine general medical examination at a health care facility 09/23/2013   ED (erectile dysfunction) 07/23/2011   History of colonic polyps 12/25/2010   HYPERTRIGLYCERIDEMIA 06/13/2009    PCP: Dorothyann Peng, NP  REFERRING PROVIDER: Eustace Moore, MD  REFERRING DIAG: [phone] (ICD-10-CM) - Spinal stenosis, lumbar region with neurogenic claudication  Rationale for Evaluation and Treatment: Rehabilitation  THERAPY DIAG:  Other low back pain  Other abnormalities of gait and mobility  Muscle weakness (generalized)  ONSET DATE: 03/20/2022 s/p laminectomy L3-5  SUBJECTIVE:                                                                                                                                                                                           SUBJECTIVE STATEMENT: Pt reports that he  walked a "full 3 miles, 1 hour" and reported some soreness/tightness following.  PERTINENT HISTORY:  S/P laminectomy of L3-5, OA  PAIN:  Are you having pain? Yes: NPRS scale: 2/10 Pain location: low back Pain description: aching Aggravating factors: increased walking, trying to move after sitting for a long time Relieving factors: medication  PRECAUTIONS: Back  WEIGHT BEARING RESTRICTIONS: No  FALLS:  Has patient fallen in last 6 months? 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  NEXT MD VISIT:   OBJECTIVE:   DIAGNOSTIC FINDINGS:  Lumbar MRI on 12/28/2021:  IMPRESSION: 1. Mild progression of degenerative changes since prior MRI. 2. Severe spinal canal stenosis at L3-4. 3. Moderate spinal canal stenosis with moderate narrowing of the bilateral subarticular zones at L2-3. 4. Mild spinal canal stenosis with prominent narrowing of the bilateral subarticular zones at L4-5. Moderate right neural foraminal narrowing at this level. 5. Moderate left neural foraminal narrowing at L5-S1.  PATIENT SURVEYS:  Eval:  FOTO 63% (projected 70% by visit 10) 07/24/2022:  FOTO 72%  SCREENING FOR RED FLAGS: Bowel or bladder incontinence: No Spinal tumors: No Cauda equina syndrome: No Compression fracture: No Abdominal aneurysm: No  COGNITION: Overall cognitive status: Within functional limits for tasks assessed     SENSATION: Reports occasionally getting numbness in his feet  MUSCLE LENGTH: Hamstrings: tightness noted  POSTURE: rounded shoulders and forward head  PALPATION: No  tenderness to palpation, but some palpable muscle spasms noted on lumbar paraspinals  LUMBAR ROM:   Per back precautions, but limited approx 25%  LOWER EXTREMITY ROM:     WFL  LOWER EXTREMITY MMT:    06/21/2022: Bilateral hip strength of 4/5 Bilateral hamstring strength of 4/5  LUMBAR SPECIAL TESTS:  Eval:  Slump test: Positive slightly on right side  FUNCTIONAL TESTS:  Eval:  5 times sit to stand: 13.9 sec without UE use  GAIT: Distance walked: 45 min Assistive device utilized: None Level of assistance: Complete Independence Comments: States that at 45 min ambulation, he starts noting increased pain/soreness and needs to go lie down  TODAY'S TREATMENT:      DATE: 08/02/2022  Nustep level 5 x6 min with PT present to discuss status Standing hamstring stretch at stairs 2x20 sec bilat Wall squat 2x10 Standing lat pull 35# 2x10 Seated rows 25# 2x10 4 way resisted gait with 10# pulley x3 each direction Seated green pball roll-out 5x10 sec 4 way pelvic tilt seated on dynadisc x20 each Sit to stand holding 10# kettlebell 2x10   DATE: 07/29/2022  Nustep level 5 x6 min with PT present to discuss status Standing hamstring stretch at stairs 2x20 sec bilat Wall squat 2x10 Seated core series with 10# kettlebell:  hip to hip, hip to shoulder.  X10 each Seated piriformis stretch 2x20 sec Seated green pball roll-out 5x10 sec Supine bridging 2x10 Supine clamshell with yellow loop 2x10 bilat Supine marching with posterior pelvic tilt with yellow loop around the knees 2x10   DATE: 07/24/2022  Recumbent bike level 3 x6 min with PT present to discuss status Seated piriformis stretch 2x20 sec Sit to/from stand holding 10# kettlebell:  x10 with chest press, x10 with overhead press Seated core series with 10# kettlebell:  hip to hip, hip to shoulder.  X10 each Standing single leg RDL with 10# kettlebell with support of barre x10 bilat Modified dead lift with 10# kettlebell  x10 Standing hamstring stretch at stairs 2x20 sec bilat Wall squat 2x10 Sit to/from stand holding 10# kettlebell:  x10 with chest press, x10 with overhead press  PATIENT EDUCATION:  Education details: Issued HEP Person educated: Patient Education method: Explanation, Demonstration, and Handouts Education comprehension: verbalized understanding and returned demonstration  HOME EXERCISE PROGRAM: Access Code: [email or web address] URL: [email or web address] Date: 07/03/2022 Prepared by: Shelby Dubin Isauro Skelley  Exercises - Seated Cat Cow  - 1 x daily - 7 x weekly - 1 sets - 10 reps - Thomas Stretch on Table  - 1 x daily - 7 x weekly - 1 sets - 2 reps - 60 hold - Standing Hip Abduction  - 1 x daily - 7 x weekly - 1 sets - 15 reps - Standing Hip Extension  - 1 x daily - 7 x weekly - 3 sets - 10 reps - Standing Hamstring Stretch on Chair  - 2 x daily - 7 x weekly - 1 sets - 3 reps - 30 sec hold - Standing Quad Stretch with Table and Chair Support  - 2 x daily - 7 x weekly - 1 sets - 3 reps - 30 sec hold - Supine Double Knee to Chest  - 1 x daily - 7 x weekly - 1 sets - 3 reps - 30 hold - Supine Posterior Pelvic Tilt  - 1 x daily - 7 x weekly - 1 sets - 20 reps - Supine Dead Bug with Leg Extension  - 1 x daily - 7 x weekly - 1 sets - 20 reps - Supine Figure 4 Piriformis Stretch  - 1 x daily - 7 x weekly - 1 sets - 3 reps - 20 sec hold - Sidelying Hip Abduction  - 1 x daily - 7 x weekly - 2 sets - 10 reps - Clamshell with Resistance  - 1 x daily - 7 x weekly - 2 sets - 10 reps  ASSESSMENT:  CLINICAL IMPRESSION: Mr Winiarski presents to skilled PT reporting feeling better after last PT session.  Patient able to progress with some strengthening exercises using weight machines during session today.  Patient requires min cuing for technique and posture during exercises, but able to complete.  Patient to  have 10th visit session next visit.   OBJECTIVE IMPAIRMENTS: difficulty walking, decreased ROM, decreased strength, increased muscle spasms, impaired flexibility, postural dysfunction, and pain.   ACTIVITY LIMITATIONS: lifting, bending, and squatting  PARTICIPATION LIMITATIONS: community activity  PERSONAL FACTORS: Time since onset of injury/illness/exacerbation and 1-2 comorbidities: OA, s/p lumbar laminectomy on 03/20/22  are also affecting patient's functional outcome.   REHAB POTENTIAL: Good  CLINICAL DECISION MAKING: Stable/uncomplicated  EVALUATION COMPLEXITY: Low   GOALS: Goals reviewed with patient? Yes  SHORT TERM GOALS: Target date: 07/12/2022  Pt will be independent with initial HEP. Baseline: Goal status: MET on 07/03/22  2.  Patient will report at least a 30% improvement in symptoms since starting PT. Baseline:  Goal status: MET on 07/10/2022   LONG TERM GOALS: Target date: 08/16/2022  Pt will be independent with advanced HEP. Baseline:  Goal status: IN PROGRESS  2.  Patient will report ability to walk for at least an hour without increased pain to allow him to return to hiking. Baseline:  Goal status: IN PROGRESS  3.  Patient will increase FOTO to at least 70% to demonstrate improvements in functional mobility. Baseline: 63% Goal status: MET on 07/24/2022  4.  Patient will increase bilateral hip strength to at least 4+/5 to allow him increased ease with sit to stand from lower surfaces. Baseline:  Goal status: INITIAL  5.  Pt will be able to complete 5 times sit  to/from stand assessment in 10 seconds or less to demonstrate improved functional use. Baseline: 13.9 sec Goal status: INITIAL   PLAN:  PT FREQUENCY: 2x/week  PT DURATION: 8 weeks  PLANNED INTERVENTIONS: Therapeutic exercises, Therapeutic activity, Neuromuscular re-education, Balance training, Gait training, Patient/Family education, Self Care, Joint mobilization, Joint manipulation, Stair  training, Aquatic Therapy, Dry Needling, Electrical stimulation, Spinal manipulation, Spinal mobilization, Cryotherapy, scar mobilization, Taping, Traction, Ultrasound, Ionotophoresis '4mg'$ /ml Dexamethasone, Manual therapy, and Re-evaluation.  PLAN FOR NEXT SESSION: Continue with core stability. Progress strengthening, functional gait   Juel Burrow PT, DPT 08/02/22  12:06 PM  [facility] [address] [place], [place] [address] Phone # [phone] Fax [phone]

## 2022-08-05 ENCOUNTER — Encounter: Payer: Self-pay | Admitting: Rehabilitative and Restorative Service Providers"

## 2022-08-05 ENCOUNTER — Ambulatory Visit: Payer: Medicare HMO | Admitting: Rehabilitative and Restorative Service Providers"

## 2022-08-05 DIAGNOSIS — R2689 Other abnormalities of gait and mobility: Secondary | ICD-10-CM

## 2022-08-05 DIAGNOSIS — R252 Cramp and spasm: Secondary | ICD-10-CM | POA: Diagnosis not present

## 2022-08-05 DIAGNOSIS — M5459 Other low back pain: Secondary | ICD-10-CM | POA: Diagnosis not present

## 2022-08-05 DIAGNOSIS — G8929 Other chronic pain: Secondary | ICD-10-CM | POA: Diagnosis not present

## 2022-08-05 DIAGNOSIS — M6281 Muscle weakness (generalized): Secondary | ICD-10-CM | POA: Diagnosis not present

## 2022-08-05 DIAGNOSIS — M545 Low back pain, unspecified: Secondary | ICD-10-CM | POA: Diagnosis not present

## 2022-08-05 NOTE — Therapy (Addendum)
OUTPATIENT PHYSICAL THERAPY TREATMENT NOTE AND LATE ENTRY DISCHARGE SUMMARY   Patient Name: Marcus Reynolds MRN: [id number] DOB:[birth year], 77 y.o., male Today's Date: 08/05/2022    Progress Note Reporting Period 06/21/2022 to 08/05/2022  See note below for Objective Data and Assessment of Progress/Goals.       END OF SESSION:  PT End of Session - 08/05/22 1102     Visit Number 10    Date for PT Re-Evaluation 08/16/22    Authorization Type [employer]    Progress Note Due on Visit 20    PT Start Time 1100    PT Stop Time 1140    PT Time Calculation (min) 40 min    Activity Tolerance Patient tolerated treatment well    Behavior During Therapy [facility] for tasks assessed/performed               Past Medical History:  Diagnosis Date   Hyperlipidemia    high tri   Personal history of colonic polyps 12/25/2010   Past Surgical History:  Procedure Laterality Date   CATARACT EXTRACTION Bilateral 10/2021   COLONOSCOPY  12/25/2010   internal hemorrhoids   COLONOSCOPY W/ POLYPECTOMY  06/10/2005   [place]   INGUINAL HERNIA REPAIR     right side   TONSILECTOMY, ADENOIDECTOMY, BILATERAL MYRINGOTOMY AND TUBES     Patient Active Problem List   Diagnosis Date Noted   Osteoarthritis of spine with radiculopathy, lumbar region 08/04/2017   Routine general medical examination at a health care facility 09/23/2013   ED (erectile dysfunction) 07/23/2011   History of colonic polyps 12/25/2010   HYPERTRIGLYCERIDEMIA 06/13/2009    PCP: Shirline Frees, NP  REFERRING PROVIDER: Tia Alert, MD  REFERRING DIAG: [phone] (ICD-10-CM) - Spinal stenosis, lumbar region with neurogenic claudication  Rationale for Evaluation and Treatment: Rehabilitation  THERAPY DIAG:  Other low back pain  Other abnormalities of gait and mobility  Muscle weakness (generalized)  Cramp and spasm  ONSET DATE: 03/20/2022 s/p laminectomy L3-5  SUBJECTIVE:                                                                                                                                                                                            SUBJECTIVE STATEMENT: Pt reports that he felt like he got a good workout last session.  Reports that she was able to walk at the park with his wife for an hour with minimal soreness.  PERTINENT HISTORY:  S/P laminectomy of L3-5, OA  PAIN:  Are you having pain? Yes: NPRS scale: 1/10 Pain location: low back Pain description: aching Aggravating factors: increased walking, trying to move  after sitting for a long time Relieving factors: medication  PRECAUTIONS: Back  WEIGHT BEARING RESTRICTIONS: No  FALLS:  Has patient fallen in last 6 months? 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  NEXT MD VISIT:   OBJECTIVE:   DIAGNOSTIC FINDINGS:  Lumbar MRI on 12/28/2021:  IMPRESSION: 1. Mild progression of degenerative changes since prior MRI. 2. Severe spinal canal stenosis at L3-4. 3. Moderate spinal canal stenosis with moderate narrowing of the bilateral subarticular zones at L2-3. 4. Mild spinal canal stenosis with prominent narrowing of the bilateral subarticular zones at L4-5. Moderate right neural foraminal narrowing at this level. 5. Moderate left neural foraminal narrowing at L5-S1.  PATIENT SURVEYS:  Eval:  FOTO 63% (projected 70% by visit 10) 07/24/2022:  FOTO 72%  COGNITION: Overall cognitive status: Within functional limits for tasks assessed     SENSATION: Reports occasionally getting numbness in his feet  MUSCLE  LENGTH: Hamstrings: tightness noted  POSTURE: rounded shoulders and forward head  PALPATION: No tenderness to palpation, but some palpable muscle spasms noted on lumbar paraspinals  LUMBAR ROM:   Per back precautions, but limited approx 25%  LOWER EXTREMITY ROM:     WFL  LOWER EXTREMITY MMT:    06/21/2022: Bilateral hip strength of 4/5 Bilateral hamstring strength of 4/5  LUMBAR SPECIAL TESTS:  Eval:  Slump test: Positive slightly on right side  FUNCTIONAL TESTS:  Eval:  5 times sit to stand: 13.9 sec without UE use  08/05/2022: 5 times sit to stand: 8 sec without UE use  GAIT: Distance walked: 45 min Assistive device utilized: None Level of assistance: Complete Independence Comments: States that at 45 min ambulation, he starts noting increased pain/soreness and needs to go lie down  TODAY'S TREATMENT:      DATE: 08/05/2022  Nustep level 5 x6 min with PT present to discuss status 5 times sit to stand Sit to stand holding 15# kettlebell 2x10 4 way pelvic tilt seated on dynadisc x20 each Hip Matrix:  abduction and extension  40# 2x10 each bilat Standing hamstring stretch at stairs 2x20 sec bilat Wall squat 2x10 Standing lat pull 35# 2x10 Seated rows 25# 2x10 4 way resisted gait with 10# pulley x3 each direction   DATE: 08/02/2022  Nustep level 5 x6 min with PT present to discuss status Standing hamstring stretch at stairs 2x20 sec bilat Wall squat 2x10 Standing lat pull 35# 2x10 Seated rows 25# 2x10 4 way resisted gait with 10# pulley x3 each direction Seated green pball roll-out 5x10 sec 4 way pelvic tilt seated on dynadisc x20 each Sit to stand holding 10# kettlebell 2x10   DATE: 07/29/2022  Nustep level 5 x6 min with PT present to discuss status Standing hamstring stretch at stairs 2x20 sec bilat Wall squat 2x10 Seated core series with 10# kettlebell:  hip to hip, hip to shoulder.  X10 each Seated piriformis stretch 2x20 sec Seated green pball  roll-out 5x10 sec Supine bridging 2x10 Supine clamshell with yellow loop 2x10 bilat Supine marching with posterior pelvic tilt with yellow loop around the knees 2x10  PATIENT EDUCATION:  Education details: Issued HEP Person educated: Patient Education method: Explanation, Demonstration, and Handouts Education comprehension: verbalized understanding and returned demonstration  HOME EXERCISE PROGRAM: Access Code: [email or web address] URL: [email or web address] Date: 07/03/2022 Prepared by: Clydie Braun Kista Robb  Exercises - Seated Cat Cow  - 1 x daily - 7 x weekly - 1 sets - 10 reps - Thomas Stretch on Table  - 1 x daily - 7 x weekly - 1 sets - 2 reps - 60 hold - Standing Hip Abduction  - 1 x daily - 7 x weekly - 1 sets - 15 reps - Standing Hip Extension  - 1 x daily - 7 x weekly - 3 sets - 10 reps - Standing Hamstring Stretch on Chair  - 2 x daily - 7 x weekly - 1 sets - 3 reps - 30 sec hold - Standing Quad Stretch with Table and Chair Support  - 2 x daily - 7 x weekly - 1 sets - 3 reps - 30 sec hold - Supine Double Knee to Chest  - 1 x daily - 7 x weekly - 1 sets - 3 reps - 30 hold - Supine Posterior Pelvic Tilt  - 1 x daily - 7 x weekly - 1 sets - 20 reps - Supine Dead Bug with Leg Extension  - 1 x daily - 7 x weekly - 1 sets - 20 reps - Supine Figure 4 Piriformis Stretch  - 1 x daily - 7 x weekly - 1 sets - 3 reps - 20 sec hold - Sidelying Hip Abduction  - 1 x daily - 7 x weekly - 2 sets - 10 reps - Clamshell with Resistance  - 1 x daily - 7 x weekly - 2 sets - 10 reps  ASSESSMENT:  CLINICAL IMPRESSION: Mr Quiring presents to skilled PT reporting that he was able to go trail walking with his wife with no increased pain, just a little slight soreness.  Patient states that he is overall feeling significantly better and states that he feels that he will be ready for discharge next visit.  Patient is progressing  with improved time with 5 times sit to stand and previously met FOTO goal.  Patient is independent with HEP and has resumed ambulation with his wife.     OBJECTIVE IMPAIRMENTS: difficulty walking, decreased ROM, decreased strength, increased muscle spasms, impaired flexibility, postural dysfunction, and pain.   ACTIVITY LIMITATIONS: lifting, bending, and squatting  PARTICIPATION LIMITATIONS: community activity  PERSONAL FACTORS: Time since onset of injury/illness/exacerbation and 1-2 comorbidities: OA, s/p lumbar laminectomy on 03/20/22  are also affecting patient's functional outcome.   REHAB POTENTIAL: Good  CLINICAL DECISION MAKING: Stable/uncomplicated  EVALUATION COMPLEXITY: Low   GOALS: Goals reviewed with patient? Yes  SHORT TERM GOALS: Target date: 07/12/2022  Pt will be independent with initial HEP. Baseline: Goal status: MET on 07/03/22  2.  Patient will report at least a 30% improvement in symptoms since starting PT. Baseline:  Goal status: MET on 07/10/2022   LONG TERM GOALS: Target date: 08/16/2022  Pt will be independent with advanced HEP. Baseline:  Goal status: IN PROGRESS  2.  Patient will report ability to walk for at least an hour without increased pain to allow him to return to hiking. Baseline:  Goal status: MET on 08/05/2022  3.  Patient will increase FOTO to at least 70% to demonstrate improvements in functional mobility. Baseline: 63% Goal status: MET on 07/24/2022  4.  Patient will increase bilateral hip strength  to at least 4+/5 to allow him increased ease with sit to stand from lower surfaces. Baseline:  Goal status: INITIAL  5.  Pt will be able to complete 5 times sit to/from stand assessment in 10 seconds or less to demonstrate improved functional use. Baseline: 13.9 sec Goal status: MET on 08/05/2022   PLAN:  PT FREQUENCY: 2x/week  PT DURATION: 8 weeks  PLANNED INTERVENTIONS: Therapeutic exercises, Therapeutic activity, Neuromuscular  re-education, Balance training, Gait training, Patient/Family education, Self Care, Joint mobilization, Joint manipulation, Stair training, Aquatic Therapy, Dry Needling, Electrical stimulation, Spinal manipulation, Spinal mobilization, Cryotherapy, scar mobilization, Taping, Traction, Ultrasound, Ionotophoresis 4mg /ml Dexamethasone, Manual therapy, and Re-evaluation.  PLAN FOR NEXT SESSION: possible discharge   Zahir Eisenhour PT, DPT 08/05/22  1:41 PM  [facility] [address] [place], [place] [address] Phone # [phone] Fax [phone]   PHYSICAL THERAPY DISCHARGE SUMMARY  Pt has not returned since visit on 08/05/2022, so discharged at this time.  Patient agrees to discharge. Patient goals were met. Patient is being discharged due to not returning since the last visit.  Clydie Braun Lachelle Rissler, PT, DPT 02/19/23, 12:13 PM

## 2022-08-07 ENCOUNTER — Ambulatory Visit: Payer: Medicare HMO | Admitting: Rehabilitative and Restorative Service Providers"

## 2022-08-12 ENCOUNTER — Ambulatory Visit (INDEPENDENT_AMBULATORY_CARE_PROVIDER_SITE_OTHER): Payer: Medicare HMO

## 2022-08-12 VITALS — Ht 73.5 in | Wt 185.0 lb

## 2022-08-12 DIAGNOSIS — Z0000 Encounter for general adult medical examination without abnormal findings: Secondary | ICD-10-CM | POA: Diagnosis not present

## 2022-08-12 NOTE — Progress Notes (Signed)
Subjective:   Marcus Reynolds is a 77 y.o. male who presents for Medicare Annual/Subsequent preventive examination.  Review of Systems    Virtual Visit via Telephone Note  I connected with  Marcus Reynolds on 08/12/22 at  9:45 AM EST by telephone and verified that I am speaking with the correct person using two identifiers.  Location: Patient: Home Provider: Office Persons participating in the virtual visit: patient/Nurse Health Advisor   I discussed the limitations, risks, security and privacy concerns of performing an evaluation and management service by telephone and the availability of in person appointments. The patient expressed understanding and agreed to proceed.  Interactive audio and video telecommunications were attempted between this nurse and patient, however failed, due to patient having technical difficulties OR patient did not have access to video capability.  We continued and completed visit with audio only.  Some vital signs may be absent or patient reported.   Marcus Peaches, LPN  Cardiac Risk Factors include: advanced age (>72mn, >>33women);male gender     Objective:    Today's Vitals   08/12/22 0953  Weight: 185 lb (83.9 kg)  Height: 6' 1.5" (1.867 m)   Body mass index is 24.08 kg/m.     08/12/2022   10:02 AM 06/21/2022   10:25 AM 08/28/2021   12:33 PM 08/07/2021   10:13 AM 07/11/2020    8:27 AM 07/09/2019    8:42 AM 12/02/2017   12:56 PM  Advanced Directives  Does Patient Have a Medical Advance Directive? Yes Yes Yes Yes Yes Yes No  Type of AParamedicof AHannaLiving will HJesupLiving will HTullytownLiving will HSawyervilleLiving will HBlue RidgeLiving will HHydeLiving will   Does patient want to make changes to medical advance directive?  No - Patient declined  No - Patient declined No - Patient declined No - Patient declined   Copy  of HRichlandin Chart? No - copy requested No - copy requested No - copy requested No - copy requested No - copy requested No - copy requested   Would patient like information on creating a medical advance directive?       No - Patient declined    Current Medications (verified) Outpatient Encounter Medications as of 08/12/2022  Medication Sig   clonazePAM (KLONOPIN) 0.5 MG tablet Take 0.5-1 tablets (0.25-0.5 mg total) by mouth at bedtime as needed for anxiety.   gemfibrozil (LOPID) 600 MG tablet Take 1 tablet (600 mg total) by mouth 2 (two) times daily before a meal.   Multiple Vitamin (MULTIVITAMIN) tablet Take 1 tablet by mouth daily.   sildenafil (REVATIO) 20 MG tablet Take 1 to 2 tabs 2 - 3 hours before sex   No facility-administered encounter medications on file as of 08/12/2022.    Allergies (verified) Patient has no known allergies.   History: Past Medical History:  Diagnosis Date   Hyperlipidemia    high tri   Personal history of colonic polyps 12/25/2010   Past Surgical History:  Procedure Laterality Date   BACK SURGERY     CATARACT EXTRACTION Bilateral 10/2021   COLONOSCOPY  12/25/2010   internal hemorrhoids   [place]/ [place]    INGUINAL HERNIA REPAIR     right side   TONSILECTOMY, ADENOIDECTOMY, BILATERAL MYRINGOTOMY AND TUBES     Family History  Problem Relation Age  of Onset   Heart disease Mother    Diabetes Mother    Ulcerative colitis Mother    Colon cancer Neg Hx    Esophageal cancer Neg Hx    Rectal cancer Neg Hx    Stomach cancer Neg Hx    Social History   Socioeconomic History   Marital status: Married    Spouse name: Not on file   Number of children: 2   Years of education: Not on file   Highest education level: Professional school degree (e.g., MD, DDS, DVM, JD)  Occupational History   Occupation: Retired  Tobacco Use   Smoking status: Never   Smokeless tobacco:  Never  Vaping Use   Vaping Use: Never used  Substance and Sexual Activity   Alcohol use: Yes    Alcohol/week: 1.0 standard drink of alcohol    Types: 1 Glasses of wine per week    Comment: socially   Drug use: No   Sexual activity: Not on file  Other Topics Concern   Not on file  Social History Narrative   Retired   [facility] [employer] [facility] 2   Married    Volunteers at [employer]: [place]  (08/12/2022)   Overall Financial Resource Strain (CARDIA)    Difficulty of Paying Living Expenses: Not hard at all  Food Insecurity: No Food Insecurity (08/12/2022)   Hunger Vital Sign    Worried About Running Out of Food in the Last Year: Never true    [place] in the Last Year: Never true  Transportation Needs: No Transportation Needs (08/12/2022)   PRAPARE - Hydrologist (Medical): No    Lack of Transportation (Non-Medical): No  Physical Activity: Sufficiently Active (08/12/2022)   Exercise Vital Sign    Days of Exercise per Week: 5 days    Minutes of Exercise per Session: 60 min  Stress: No Stress Concern Present (08/12/2022)   [place]    Feeling of Stress : Not at all  Social Connections: [place] (08/12/2022)   Social Connection and Isolation Panel [NHANES]    Frequency of Communication with Friends and Family: More than three times a week    Frequency of Social Gatherings with Friends and Family: More than three times a week    Attends Religious Services: More than 4 times per year    Active Member of [employer] or Organizations: Yes    Attends Music therapist: More than 4 times per year    Marital Status: Married    Tobacco Counseling Counseling given: Not Answered   Clinical Intake:  Pre-visit preparation completed: No  Pain : No/denies pain     BMI - recorded: 24.08 Nutritional Status: BMI of 19-24   Normal Nutritional Risks: None Diabetes: No  How often do you need to have someone help you when you read instructions, pamphlets, or other written materials from your doctor or pharmacy?: 1 - Never  Diabetic?  No  Interpreter Needed?: No  Information entered by :: Ursula Alert LPN   Activities of Daily Living    08/12/2022   10:00 AM  In your present state of health, do you have any difficulty performing the following activities:  Hearing? 0  Vision? 0  Difficulty concentrating or making decisions? 0  Walking or climbing stairs? 0  Dressing or bathing? 0  Doing errands, shopping? 0  Preparing Food and eating ? N  Using the Toilet? N  In the past six months, have you accidently leaked urine? N  Do you have problems with loss of bowel control? N  Managing your Medications? N  Managing your Finances? N  Housekeeping or managing your Housekeeping? N    Patient Care Team: Dorothyann Peng, NP as PCP - General (Family Medicine)  Indicate any recent Medical Services you may have received from other than [facility] providers in the past year (date may be approximate).     Assessment:   This is a routine wellness examination for Marcus Reynolds.  Hearing/Vision screen Hearing Screening - Comments:: Denies hearing difficulties   Vision Screening - Comments:: Wears rx glasses - up to date with routine eye exams with  Dr Eulas Post  Dietary issues and exercise activities discussed: Current Exercise Habits: Home exercise routine, Time (Minutes): 60, Frequency (Times/Week): 5, Weekly Exercise (Minutes/Week): 300, Intensity: Moderate, Exercise limited by: None identified   Goals Addressed               This Visit's Progress     Increase physical activity (pt-stated)         Depression Screen    08/12/2022   10:00 AM 02/12/2022   10:09 AM 08/07/2021    1:58 PM 08/07/2021   10:07 AM 07/11/2020    8:30 AM 07/09/2019    8:46 AM 10/30/2017   10:35 AM  PHQ 2/9 Scores  PHQ - 2 Score 0 0 0 0 0 0 0   PHQ- 9 Score  1 0        Fall Risk    08/12/2022   10:01 AM 08/07/2021   10:13 AM 02/08/2021   10:01 AM 07/11/2020    8:28 AM 07/09/2019    8:46 AM  Fall Risk   Falls in the past year? 0 0 0 0 0  Number falls in past yr: 0 0 0 0   Injury with Fall? 0 0 0 0   Risk for fall due to : No Fall Risks No Fall Risks  No Fall Risks Medication side effect;Orthopedic patient  Follow up Falls prevention discussed   Falls evaluation completed;Falls prevention discussed Falls evaluation completed;Education provided;Falls prevention discussed    FALL RISK PREVENTION PERTAINING TO THE HOME:  Any stairs in or around the home? Yes  If so, are there any without handrails? No  Home free of loose throw rugs in walkways, pet beds, electrical cords, etc? Yes  Adequate lighting in your home to reduce risk of falls? Yes   ASSISTIVE DEVICES UTILIZED TO PREVENT FALLS:  Life alert? No Use of a cane,  walker or w/c? No  Grab bars in the bathroom? No Shower chair or bench in shower? Yes Elevated toilet seat or a handicapped toilet? No   TIMED UP AND GO:  Was the test performed? No . Audio Visit    Cognitive Function:        08/12/2022   10:02 AM 08/07/2021   10:14 AM 07/09/2019    8:48 AM  6CIT Screen  What Year? 0 points 0 points 0 points  What month? 0 points 0 points 0 points  What time? 0 points 0 points 0 points  Count back from 20 0 points 0 points 0 points  Months in reverse 0 points 0 points 0 points  Repeat phrase 0 points 0 points 0 points  Total Score 0 points 0 points 0 points    Immunizations Immunization History  Administered Date(s) Administered   Fluad Quad(high Dose 65+) 02/04/2019, 02/08/2021   Influenza Split 03/26/2011, 03/27/2012   Influenza Whole 06/13/2009   Influenza, High Dose Seasonal PF 03/14/2015, 02/29/2016, 03/13/2017, 03/13/2018   Influenza,inj,Quad PF,6+ Mos 03/24/2013, 03/22/2014   Influenza-Unspecified 02/24/2020, 03/08/2022   Moderna Sars-Covid-2  Vaccination 04/05/2022   PFIZER(Purple Top)SARS-COV-2 Vaccination 06/30/2019, 07/21/2019, 03/24/2020, 11/02/2020   Pfizer Covid-19 Vaccine Bivalent Booster 42yr & up 08/24/2021   Pneumococcal Conjugate-13 09/23/2013   Pneumococcal Polysaccharide-23 07/09/2010   Respiratory Syncytial Virus Vaccine,Recomb Aduvanted(Arexvy) 03/08/2022   Td 09/27/2014   Zoster Recombinat (Shingrix) 02/13/2019, 06/15/2019   Zoster, Live 06/10/2006    TDAP status: Up to date  Flu Vaccine status: Up to date  Pneumococcal vaccine status: Up to date  Covid-19 vaccine status: Completed vaccines  Qualifies for Shingles Vaccine? Yes   Zostavax completed Yes   Shingrix Completed?: Yes  Screening Tests Health Maintenance  Topic Date Due   COVID-19 Vaccine (7 - 2023-24 season) 08/28/2022 (Originally 05/31/2022)   Medicare Annual Wellness (AWV)  08/12/2023   DTaP/Tdap/Td (2 - Tdap) 09/26/2024   COLONOSCOPY (Pts 45-467yrInsurance coverage will need to be confirmed)  01/16/2025   Pneumonia Vaccine 6569Years old  Completed   INFLUENZA VACCINE  Completed   Hepatitis C Screening  Completed   Zoster Vaccines- Shingrix  Completed   HPV VACCINES  Aged Out    Health Maintenance  There are no preventive care reminders to display for this patient.   Colorectal cancer screening: Type of screening: Colonoscopy. Completed 01/17/20. Repeat every 5 years  Lung Cancer Screening: (Low Dose CT Chest recommended if Age 77-80ears, 30 pack-year currently smoking OR have quit w/in 15years.) does not qualify.    Additional Screening:  Hepatitis C Screening: does qualify; Completed 02/04/19  Vision Screening: Recommended annual ophthalmology exams for early detection of glaucoma and other disorders of the eye. Is the patient up to date with their annual eye exam?  Yes  Who is the provider or what is the name of the office in which the patient attends annual eye exams? Dr GlEulas Postf pt is not established with a provider,  would they like to be referred to a provider to establish care? No .   Dental Screening: Recommended annual dental exams for proper oral hygiene  Community Resource Referral / Chronic Care Management:  CRR required this visit?  No   CCM required this visit?  No      Plan:     I have personally reviewed and noted the following in the patient's chart:   Medical and social history Use of alcohol, tobacco or illicit drugs  Current medications and supplements including opioid prescriptions. Patient is not currently taking opioid prescriptions. Functional ability and status Nutritional status Physical activity Advanced directives List of other physicians Hospitalizations, surgeries, and ER visits in previous 12 months Vitals Screenings to include cognitive, depression, and falls Referrals and appointments  In addition, I have reviewed and discussed with patient certain preventive protocols, quality metrics, and best practice recommendations. A written personalized care plan for preventive services as well as general preventive health recommendations were provided to patient.     BeCriselda PeachesLPN   [id number] Nurse Notes:  None

## 2022-08-12 NOTE — Patient Instructions (Addendum)
Mr. Marcus Reynolds , Thank you for taking time to come for your Medicare Wellness Visit. I appreciate your ongoing commitment to your health goals. Please review the following plan we discussed and let me know if I can assist you in the future.   These are the goals we discussed:  Goals       DIET - INCREASE WATER INTAKE      Increase physical activity (pt-stated)      Patient Stated      Visit daughter in [place] once pandemic settles       Patient Stated (pt-stated)      I would like to travel internationally.         This is a list of the screening recommended for you and due dates:  Health Maintenance  Topic Date Due   COVID-19 Vaccine (7 - 2023-24 season) 08/28/2022*   Medicare Annual Wellness Visit  08/12/2023   DTaP/Tdap/Td vaccine (2 - Tdap) 09/26/2024   Colon Cancer Screening  01/16/2025   Pneumonia Vaccine  Completed   Flu Shot  Completed   Hepatitis C Screening: USPSTF Recommendation to screen - Ages 18-79 yo.  Completed   Zoster (Shingles) Vaccine  Completed   HPV Vaccine  Aged Out  *Topic was postponed. The date shown is not the original due date.    Advanced directives: Please bring a copy of your health care power of attorney and living will to the office to be added to your chart at your convenience.   Conditions/risks identified: None  Next appointment: Follow up in one year for your annual wellness visit.     Preventive Care 53 Years and Older, Male  Preventive care refers to lifestyle choices and visits with your health care provider that can promote health and wellness. What does preventive care include? A yearly physical exam. This is also called an annual well check. Dental exams once or twice a year. Routine eye exams. Ask your health care provider how often you should have your eyes checked. Personal lifestyle choices, including: Daily care of your teeth and gums. Regular physical activity. Eating a healthy diet. Avoiding tobacco and drug  use. Limiting alcohol use. Practicing safe sex. Taking low doses of aspirin every day. Taking vitamin and mineral supplements as recommended by your health care provider. What happens during an annual well check? The services and screenings done by your health care provider during your annual well check will depend on your age, overall health, lifestyle risk factors, and family history of disease. Counseling  Your health care provider may ask you questions about your: Alcohol use. Tobacco use. Drug use. Emotional well-being. Home and relationship well-being. Sexual activity. Eating habits. History of falls. Memory and ability to understand (cognition). Work and work Statistician. Screening  You may have the following tests or measurements: Height, weight, and BMI. Blood pressure. Lipid and cholesterol levels. These may be checked every 5 years, or more frequently if you are over 72 years old. Skin check. Lung cancer screening. You may have this screening every year starting at age 60 if you have a 30-pack-year history of smoking and currently smoke or have quit within the past 15 years. Fecal occult blood test (FOBT) of the stool. You may have this test every year starting at age 47. Flexible sigmoidoscopy or colonoscopy. You may have a sigmoidoscopy every 5 years or a colonoscopy every 10 years starting at age 54. Prostate cancer screening. Recommendations will vary depending on your family history and other risks.  Hepatitis C blood test. Hepatitis B blood test. Sexually transmitted disease (STD) testing. Diabetes screening. This is done by checking your blood sugar (glucose) after you have not eaten for a while (fasting). You may have this done every 1-3 years. Abdominal aortic aneurysm (AAA) screening. You may need this if you are a current or former smoker. Osteoporosis. You may be screened starting at age 56 if you are at high risk. Talk with your health care provider about  your test results, treatment options, and if necessary, the need for more tests. Vaccines  Your health care provider may recommend certain vaccines, such as: Influenza vaccine. This is recommended every year. Tetanus, diphtheria, and acellular pertussis (Tdap, Td) vaccine. You may need a Td booster every 10 years. Zoster vaccine. You may need this after age 7. Pneumococcal 13-valent conjugate (PCV13) vaccine. One dose is recommended after age 35. Pneumococcal polysaccharide (PPSV23) vaccine. One dose is recommended after age 34. Talk to your health care provider about which screenings and vaccines you need and how often you need them. This information is not intended to replace advice given to you by your health care provider. Make sure you discuss any questions you have with your health care provider. Document Released: 06/23/2015 Document Revised: 02/14/2016 Document Reviewed: 03/28/2015 Elsevier Interactive Patient Education  2017 [employer] Prevention in the Home Falls can cause injuries. They can happen to people of all ages. There are many things you can do to make your home safe and to help prevent falls. What can I do on the outside of my home? Regularly fix the edges of walkways and driveways and fix any cracks. Remove anything that might make you trip as you walk through a door, such as a raised step or threshold. Trim any bushes or trees on the path to your home. Use bright outdoor lighting. Clear any walking paths of anything that might make someone trip, such as rocks or tools. Regularly check to see if handrails are loose or broken. Make sure that both sides of any steps have handrails. Any raised decks and porches should have guardrails on the edges. Have any leaves, snow, or ice cleared regularly. Use sand or salt on walking paths during winter. Clean up any spills in your garage right away. This includes oil or grease spills. What can I do in the bathroom? Use  night lights. Install grab bars by the toilet and in the tub and shower. Do not use towel bars as grab bars. Use non-skid mats or decals in the tub or shower. If you need to sit down in the shower, use a plastic, non-slip stool. Keep the floor dry. Clean up any water that spills on the floor as soon as it happens. Remove soap buildup in the tub or shower regularly. Attach bath mats securely with double-sided non-slip rug tape. Do not have throw rugs and other things on the floor that can make you trip. What can I do in the bedroom? Use night lights. Make sure that you have a light by your bed that is easy to reach. Do not use any sheets or blankets that are too big for your bed. They should not hang down onto the floor. Have a firm chair that has side arms. You can use this for support while you get dressed. Do not have throw rugs and other things on the floor that can make you trip. What can I do in the kitchen? Clean up any spills right away. Avoid  walking on wet floors. Keep items that you use a lot in easy-to-reach places. If you need to reach something above you, use a strong step stool that has a grab bar. Keep electrical cords out of the way. Do not use floor polish or wax that makes floors slippery. If you must use wax, use non-skid floor wax. Do not have throw rugs and other things on the floor that can make you trip. What can I do with my stairs? Do not leave any items on the stairs. Make sure that there are handrails on both sides of the stairs and use them. Fix handrails that are broken or loose. Make sure that handrails are as long as the stairways. Check any carpeting to make sure that it is firmly attached to the stairs. Fix any carpet that is loose or worn. Avoid having throw rugs at the top or bottom of the stairs. If you do have throw rugs, attach them to the floor with carpet tape. Make sure that you have a light switch at the top of the stairs and the bottom of the  stairs. If you do not have them, ask someone to add them for you. What else can I do to help prevent falls? Wear shoes that: Do not have high heels. Have rubber bottoms. Are comfortable and fit you well. Are closed at the toe. Do not wear sandals. If you use a stepladder: Make sure that it is fully opened. Do not climb a closed stepladder. Make sure that both sides of the stepladder are locked into place. Ask someone to hold it for you, if possible. Clearly mark and make sure that you can see: Any grab bars or handrails. First and last steps. Where the edge of each step is. Use tools that help you move around (mobility aids) if they are needed. These include: Canes. Walkers. Scooters. Crutches. Turn on the lights when you go into a dark area. Replace any light bulbs as soon as they burn out. Set up your furniture so you have a clear path. Avoid moving your furniture around. If any of your floors are uneven, fix them. If there are any pets around you, be aware of where they are. Review your medicines with your doctor. Some medicines can make you feel dizzy. This can increase your chance of falling. Ask your doctor what other things that you can do to help prevent falls. This information is not intended to replace advice given to you by your health care provider. Make sure you discuss any questions you have with your health care provider. Document Released: 03/23/2009 Document Revised: 11/02/2015 Document Reviewed: 07/01/2014 Elsevier Interactive Patient Education  2017 [employer].

## 2023-01-08 DIAGNOSIS — Z961 Presence of intraocular lens: Secondary | ICD-10-CM | POA: Diagnosis not present

## 2023-01-08 DIAGNOSIS — H25812 Combined forms of age-related cataract, left eye: Secondary | ICD-10-CM | POA: Diagnosis not present

## 2023-01-08 DIAGNOSIS — H3122 Choroidal dystrophy (central areolar) (generalized) (peripapillary): Secondary | ICD-10-CM | POA: Diagnosis not present

## 2023-01-08 DIAGNOSIS — H35373 Puckering of macula, bilateral: Secondary | ICD-10-CM | POA: Diagnosis not present

## 2023-01-23 ENCOUNTER — Encounter (INDEPENDENT_AMBULATORY_CARE_PROVIDER_SITE_OTHER): Payer: Self-pay

## 2023-02-14 ENCOUNTER — Encounter: Payer: Medicare HMO | Admitting: Adult Health

## 2023-02-20 ENCOUNTER — Encounter: Payer: Self-pay | Admitting: Adult Health

## 2023-02-20 ENCOUNTER — Ambulatory Visit (INDEPENDENT_AMBULATORY_CARE_PROVIDER_SITE_OTHER): Payer: Medicare HMO | Admitting: Adult Health

## 2023-02-20 VITALS — BP 130/82 | HR 65 | Temp 97.7°F | Ht 73.25 in | Wt 184.0 lb

## 2023-02-20 DIAGNOSIS — M48061 Spinal stenosis, lumbar region without neurogenic claudication: Secondary | ICD-10-CM

## 2023-02-20 DIAGNOSIS — R351 Nocturia: Secondary | ICD-10-CM | POA: Diagnosis not present

## 2023-02-20 DIAGNOSIS — E781 Pure hyperglyceridemia: Secondary | ICD-10-CM | POA: Diagnosis not present

## 2023-02-20 DIAGNOSIS — Z0000 Encounter for general adult medical examination without abnormal findings: Secondary | ICD-10-CM | POA: Diagnosis not present

## 2023-02-20 DIAGNOSIS — N401 Enlarged prostate with lower urinary tract symptoms: Secondary | ICD-10-CM

## 2023-02-20 DIAGNOSIS — F418 Other specified anxiety disorders: Secondary | ICD-10-CM | POA: Diagnosis not present

## 2023-02-20 LAB — COMPREHENSIVE METABOLIC PANEL
ALT: 15 U/L (ref 0–53)
AST: 23 U/L (ref 0–37)
Albumin: 4.6 g/dL (ref 3.5–5.2)
Alkaline Phosphatase: 142 U/L — ABNORMAL HIGH (ref 39–117)
BUN: 22 mg/dL (ref 6–23)
CO2: 28 meq/L (ref 19–32)
Calcium: 10.1 mg/dL (ref 8.4–10.5)
Chloride: 102 meq/L (ref 96–112)
Creatinine, Ser: 1.24 mg/dL (ref 0.40–1.50)
GFR: 56.03 mL/min — ABNORMAL LOW (ref >60.00)
Glucose, Bld: 87 mg/dL (ref 70–99)
Potassium: 4.4 meq/L (ref 3.5–5.1)
Sodium: 142 meq/L (ref 135–145)
Total Bilirubin: 0.8 mg/dL (ref 0.2–1.2)
Total Protein: 7.1 g/dL (ref 6.0–8.3)

## 2023-02-20 LAB — CBC
HCT: 45.5 % (ref 39.0–52.0)
Hemoglobin: 14.7 g/dL (ref 13.0–17.0)
MCHC: 32.4 g/dL (ref 30.0–36.0)
MCV: 90.7 fl (ref 78.0–100.0)
Platelets: 239 10*3/uL (ref 150.0–400.0)
RBC: 5.01 Mil/uL (ref 4.22–5.81)
RDW: 13.4 % (ref 11.5–15.5)
WBC: 5.6 10*3/uL (ref 4.0–10.5)

## 2023-02-20 LAB — LIPID PANEL
Cholesterol: 149 mg/dL (ref 0–200)
HDL: 39.3 mg/dL (ref >39.00)
LDL Cholesterol: 89 mg/dL (ref 0–99)
NonHDL: 109.66
Total CHOL/HDL Ratio: 4
Triglycerides: 102 mg/dL (ref 0.0–149.0)
VLDL: 20.4 mg/dL (ref 0.0–40.0)

## 2023-02-20 LAB — PSA: PSA: 2.72 ng/mL (ref 0.10–4.00)

## 2023-02-20 LAB — TSH: TSH: 2.58 u[IU]/mL (ref 0.35–5.50)

## 2023-02-20 MED ORDER — CLONAZEPAM 0.5 MG PO TABS: 0.2500 mg | ORAL_TABLET | Freq: Every evening | ORAL | 3 refills | Status: DC | PRN

## 2023-02-20 MED ORDER — GEMFIBROZIL 600 MG PO TABS: 600.0000 mg | ORAL_TABLET | Freq: Two times a day (BID) | ORAL | 3 refills | Status: DC

## 2023-02-20 NOTE — Patient Instructions (Signed)
It was great seeing you today   We will follow up with you regarding your lab work   Please let me know if you need anything   

## 2023-02-20 NOTE — Progress Notes (Signed)
Subjective:    Patient ID: Marcus Reynolds, male    DOB: April 12, 1946, 77 y.o.   MRN: [id number]  HPI Patient presents for yearly preventative medicine examination. He is a pleasant 77 year old male who  has a past medical history of Hyperlipidemia and Personal history of colonic polyps (12/25/2010).  Hypertriglyceridemia-prescribed Lopid 600 mg twice daily and aspirin 81 mg daily.  He denies myalgia or fatigue Lab Results  Component Value Date   CHOL 156 02/12/2022   HDL 42.70 02/12/2022   LDLCALC 94 02/12/2022   TRIG 96.0 02/12/2022   CHOLHDL 4 02/12/2022    BPH-asymptomatic  Travel Anxiety -we will use Klonopin 0.5 mg as needed when traveling. He does have some trips coming up and needs a refill.   Spinal Stenosis - Had Laminectomy in Oct 2023 at L3-L5. He does report improvement in his low back pain and radiculopathy symptoms. He did go through PT and continues to do home exercises.    All immunizations and health maintenance protocols were reviewed with the patient and needed orders were placed.  Appropriate screening laboratory values were ordered for the patient including screening of hyperlipidemia, renal function and hepatic function. If indicated by BPH, a PSA was ordered.  Medication reconciliation,  past medical history, social history, problem list and allergies were reviewed in detail with the patient  Goals were established with regard to weight loss, exercise, and  diet in compliance with medications He does exercise on a routine basis and eats healthy.  Wt Readings from Last 3 Encounters:  02/20/23 184 lb (83.5 kg)  08/12/22 185 lb (83.9 kg)  02/12/22 185 lb (83.9 kg)    Review of Systems  Constitutional: Negative.   HENT: Negative.    Eyes: Negative.   Respiratory: Negative.    Cardiovascular: Negative.   Gastrointestinal: Negative.   Endocrine: Negative.   Genitourinary: Negative.   Musculoskeletal: Negative.   Skin: Negative.   Allergic/Immunologic:  Negative.   Neurological: Negative.   Hematological: Negative.   Psychiatric/Behavioral: Negative.    All other systems reviewed and are negative.  Past Medical History:  Diagnosis Date   Hyperlipidemia    high tri   Personal history of colonic polyps 12/25/2010    Social History   Socioeconomic History   Marital status: Married    Spouse name: Not on file   Number of children: 2   Years of education: Not on file   Highest education level: Professional school degree (e.g., MD, DDS, DVM, JD)  Occupational History   Occupation: Retired  Tobacco Use   Smoking status: Never   Smokeless tobacco: Never  Vaping Use   Vaping status: Never Used  Substance and Sexual Activity   Alcohol use: Yes    Alcohol/week: 1.0 standard drink of alcohol    Types: 1 Glasses of wine per week    Comment: socially   Drug use: No   Sexual activity: Not on file  Other Topics Concern   Not on file  Social History Narrative   Retired   [facility] 2   Married    Volunteers at [employer]: Low Risk  (08/12/2022)   Overall Financial Resource Strain (CARDIA)    Difficulty of Paying Living Expenses: Not hard at all  Food Insecurity: No Food Insecurity (08/12/2022)   Hunger Vital Sign    Worried About Running Out of Food in the Last Year: Never true  Ran Out of Food in the Last Year: Never true  Transportation Needs: No Transportation Needs (08/12/2022)   PRAPARE - Administrator, Civil Service (Medical): No    Lack of Transportation (Non-Medical): No  Physical Activity: Sufficiently Active (08/12/2022)   Exercise Vital Sign    Days of Exercise per Week: 5 days    Minutes of Exercise per Session: 60 min  Stress: No Stress Concern Present (08/12/2022)   [employer] of Occupational Health - Occupational Stress Questionnaire    Feeling of Stress : Not at all  Social Connections: Socially Integrated (08/12/2022)   Social  Connection and Isolation Panel [NHANES]    Frequency of Communication with Friends and Family: More than three times a week    Frequency of Social Gatherings with Friends and Family: More than three times a week    Attends Religious Services: More than 4 times per year    Active Member of [employer] or Organizations: Yes    Attends Banker Meetings: More than 4 times per year    Marital Status: Married  Catering manager Violence: Not At Risk (08/12/2022)   Humiliation, Afraid, Rape, and Kick questionnaire    Fear of Current or Ex-Partner: No    Emotionally Abused: No    Physically Abused: No    Sexually Abused: No    Past Surgical History:  Procedure Laterality Date   BACK SURGERY     CATARACT EXTRACTION Bilateral 10/2021   COLONOSCOPY  12/25/2010   internal hemorrhoids   COLONOSCOPY W/ POLYPECTOMY  06/10/2005   [facility] Gower, [place]   EYE SURGERY Right    INGUINAL HERNIA REPAIR     right side   TONSILECTOMY, ADENOIDECTOMY, BILATERAL MYRINGOTOMY AND TUBES      Family History  Problem Relation Age of Onset   Heart disease Mother    Diabetes Mother    Ulcerative colitis Mother    Colon cancer Neg Hx    Esophageal cancer Neg Hx    Rectal cancer Neg Hx    Stomach cancer Neg Hx     No Known Allergies  Current Outpatient Medications on File Prior to Visit  Medication Sig Dispense Refill   clonazePAM (KLONOPIN) 0.5 MG tablet Take 0.5-1 tablets (0.25-0.5 mg total) by mouth at bedtime as needed for anxiety. 15 tablet 3   gemfibrozil (LOPID) 600 MG tablet Take 1 tablet (600 mg total) by mouth 2 (two) times daily before a meal. 180 tablet 3   Multiple Vitamin (MULTIVITAMIN) tablet Take 1 tablet by mouth daily.     sildenafil (REVATIO) 20 MG tablet Take 1 to 2 tabs 2 - 3 hours before sex 30 tablet 11   No current facility-administered medications on file prior to visit.    BP 130/82 (BP Location: Right Arm, Patient Position: Sitting, Cuff Size: Large)   Pulse 65   Temp 97.7 F  (36.5 C) (Oral)   Ht 6' 1.25" (1.861 m)   Wt 184 lb (83.5 kg)   SpO2 98%   BMI 24.11 kg/m       Objective:   Physical Exam Vitals and nursing note reviewed.  Constitutional:      General: He is not in acute distress.    Appearance: Normal appearance. He is not ill-appearing.  HENT:     Head: Normocephalic and atraumatic.     Right Ear: Tympanic membrane, ear canal and external ear normal. There is no impacted cerumen.     Left Ear: Tympanic membrane, ear canal  and external ear normal. There is no impacted cerumen.     Nose: Nose normal. No congestion or rhinorrhea.     Mouth/Throat:     Mouth: Mucous membranes are moist.     Pharynx: Oropharynx is clear.  Eyes:     Extraocular Movements: Extraocular movements intact.     Conjunctiva/sclera: Conjunctivae normal.     Pupils: Pupils are equal, round, and reactive to light.  Neck:     Vascular: No carotid bruit.  Cardiovascular:     Rate and Rhythm: Normal rate and regular rhythm.     Pulses: Normal pulses.     Heart sounds: No murmur heard.    No friction rub. No gallop.  Pulmonary:     Effort: Pulmonary effort is normal.     Breath sounds: Normal breath sounds.  Abdominal:     General: Abdomen is flat. Bowel sounds are normal. There is no distension.     Palpations: Abdomen is soft. There is no mass.     Tenderness: There is no abdominal tenderness. There is no guarding or rebound.     Hernia: No hernia is present.  Musculoskeletal:        General: Normal range of motion.     Cervical back: Normal range of motion and neck supple.  Lymphadenopathy:     Cervical: No cervical adenopathy.  Skin:    General: Skin is warm and dry.     Capillary Refill: Capillary refill takes less than 2 seconds.  Neurological:     General: No focal deficit present.     Mental Status: He is alert and oriented to person, place, and time.  Psychiatric:        Mood and Affect: Mood normal.        Behavior: Behavior normal.        Thought  Content: Thought content normal.        Judgment: Judgment normal.       Assessment & Plan:   1. Routine general medical examination at a health care facility Today patient counseled on age appropriate routine health concerns for screening and prevention, each reviewed and up to date or declined. Immunizations reviewed and up to date or declined. Labs ordered and reviewed. Risk factors for depression reviewed and negative. Hearing function and visual acuity are intact. ADLs screened and addressed as needed. Functional ability and level of safety reviewed and appropriate. Education, counseling and referrals performed based on assessed risks today. Patient provided with a copy of personalized plan for preventive services. - Continue to stay active and eat healthy  - Follow up in one year or sooner if needed   2. HYPERTRIGLYCERIDEMIA  - Lipid panel; Future - TSH; Future - CBC; Future - Comprehensive metabolic panel; Future - gemfibrozil (LOPID) 600 MG tablet; Take 1 tablet (600 mg total) by mouth 2 (two) times daily before a meal.  Dispense: 180 tablet; Refill: 3  3. BPH associated with nocturia  - PSA; Future  4. Situational anxiety  - clonazePAM (KLONOPIN) 0.5 MG tablet; Take 0.5-1 tablets (0.25-0.5 mg total) by mouth at bedtime as needed for anxiety.  Dispense: 20 tablet; Refill: 3  5. Spinal stenosis of lumbar region without neurogenic claudication - Follow up with Neurosurgery as directed. I am glad he is doing better.

## 2023-02-21 ENCOUNTER — Other Ambulatory Visit: Payer: Self-pay | Admitting: Adult Health

## 2023-02-21 ENCOUNTER — Encounter: Payer: Self-pay | Admitting: Adult Health

## 2023-02-21 DIAGNOSIS — R748 Abnormal levels of other serum enzymes: Secondary | ICD-10-CM

## 2023-02-25 ENCOUNTER — Other Ambulatory Visit (INDEPENDENT_AMBULATORY_CARE_PROVIDER_SITE_OTHER): Payer: Medicare HMO

## 2023-02-25 DIAGNOSIS — R748 Abnormal levels of other serum enzymes: Secondary | ICD-10-CM

## 2023-02-25 LAB — VITAMIN D 25 HYDROXY (VIT D DEFICIENCY, FRACTURES): VITD: 48.48 ng/mL (ref 30.00–100.00)

## 2023-02-25 LAB — GAMMA GT: GGT: 16 U/L (ref 7–51)

## 2023-02-26 ENCOUNTER — Encounter: Payer: Self-pay | Admitting: Adult Health

## 2023-02-26 NOTE — Telephone Encounter (Signed)
FYI

## 2023-02-28 DIAGNOSIS — L821 Other seborrheic keratosis: Secondary | ICD-10-CM | POA: Diagnosis not present

## 2023-02-28 DIAGNOSIS — L570 Actinic keratosis: Secondary | ICD-10-CM | POA: Diagnosis not present

## 2023-02-28 DIAGNOSIS — Z85828 Personal history of other malignant neoplasm of skin: Secondary | ICD-10-CM | POA: Diagnosis not present

## 2023-02-28 DIAGNOSIS — D0362 Melanoma in situ of left upper limb, including shoulder: Secondary | ICD-10-CM | POA: Diagnosis not present

## 2023-02-28 DIAGNOSIS — L814 Other melanin hyperpigmentation: Secondary | ICD-10-CM | POA: Diagnosis not present

## 2023-02-28 DIAGNOSIS — D1801 Hemangioma of skin and subcutaneous tissue: Secondary | ICD-10-CM | POA: Diagnosis not present

## 2023-04-02 DIAGNOSIS — D0362 Melanoma in situ of left upper limb, including shoulder: Secondary | ICD-10-CM | POA: Diagnosis not present

## 2023-05-21 DIAGNOSIS — H26491 Other secondary cataract, right eye: Secondary | ICD-10-CM | POA: Diagnosis not present

## 2023-05-21 DIAGNOSIS — H25812 Combined forms of age-related cataract, left eye: Secondary | ICD-10-CM | POA: Diagnosis not present

## 2023-05-21 DIAGNOSIS — H35372 Puckering of macula, left eye: Secondary | ICD-10-CM | POA: Diagnosis not present

## 2023-05-21 DIAGNOSIS — H43811 Vitreous degeneration, right eye: Secondary | ICD-10-CM | POA: Diagnosis not present

## 2023-09-23 ENCOUNTER — Ambulatory Visit: Payer: Self-pay

## 2023-09-23 NOTE — Telephone Encounter (Signed)
 Chief Complaint: lump or growth Symptoms: nickel-quarter sized lump near testicles Frequency: noticed this AM in the shower Pertinent Negatives: Patient denies abd pain, redness, swelling, pain to the testicles, fever, N/V, urinary symptoms Disposition: [] ED /[] Urgent Care (no appt availability in office) / [x] Appointment(In office/virtual)/ []  [place] [facility]/ [] Home Care/ [] Refused Recommended Disposition /[] [facility]/ []  Follow-up with PCP Additional Notes: Pt reports he noticed a nickel to quarter-sized lump or growth near his testicles this AM in the shower. Pt endorses 3/10 tenderness on palpation. Pt denies pain or swelling to his testicles. Pt denies urinary symptoms. Pt denies fever, N/V, abd pain, redness. Pt was scheduled at his PCP for 4/22 but RN went ahead and scheduled pt for 4/17 (pt unable to be seen before then d/t busy schedule) at [facility]. Pt agreeable to that plan. RN advised pt if he develops pain or swelling in his testicles, urinary symptoms, fever, abd pain, he needs to go to the ED. Pt verbalized understanding.   Reason for Disposition  All other penis - scrotum symptoms  (Exception: Painless rash < 24 hours duration.)  Answer Assessment - Initial Assessment Questions 1. SYMPTOM: "What's the main symptom you're concerned about?" (e.g., discharge from penis, rash, pain, itching, swelling)     Lump near his testicles, not on them 2. LOCATION: "Where is the lump located?"     Near his testicles 3. ONSET: "When did lump start?"     Noticed it this AM in the shower 4. PAIN: "Is there any pain?" If Yes, ask: "How bad is it?"  (Scale 1-10; or mild, moderate, severe)     Tender to the touch - 3/10 pain 5. URINE: "Any difficulty passing urine?" If Yes, ask: "When was the last time?"     No 6. CAUSE: "What do you think is causing the symptoms?"     Lump noticed next to his testicles 7. OTHER SYMPTOMS: "Do you have any other symptoms?" (e.g.,  fever, abdomen pain, blood in urine)     Irritated with walking, nickel to quarter sized lump. Denies hematuria or urinary symptoms. Denies fever. Denies abd pain. Denies pain to the testicles, redness, swelling. Endorses back pain, "but that is due to spinal stenosis and laminectomy surgery 18 months ago"  Protocols used: Penis and Scrotum Symptoms-A-AH

## 2023-09-23 NOTE — Telephone Encounter (Signed)
 1st call attempt unsuccessful, left voicemail to return call.   Copied from CRM [id number]. Topic: Clinical - Medical Advice >> Sep 23, 2023 10:00 AM Crispin Dolphin wrote: Reason for CRM: Patient called wanted to make appt for [facility] for a new bump or lump he noticed near testicles that is irritating. Scheduled for 1st available 4/22 and added to waitlist. Patient wanted to see if provider thinks he should wait until then or what he should do. Call back Home phone. Thank You

## 2023-09-24 NOTE — Telephone Encounter (Signed)
 Noted.

## 2023-09-25 ENCOUNTER — Ambulatory Visit (INDEPENDENT_AMBULATORY_CARE_PROVIDER_SITE_OTHER): Admitting: Family Medicine

## 2023-09-25 ENCOUNTER — Encounter: Payer: Self-pay | Admitting: Family Medicine

## 2023-09-25 VITALS — BP 124/60 | HR 86 | Temp 98.5°F | Ht 73.25 in | Wt 185.2 lb

## 2023-09-25 DIAGNOSIS — L02214 Cutaneous abscess of groin: Secondary | ICD-10-CM | POA: Diagnosis not present

## 2023-09-25 MED ORDER — DOXYCYCLINE HYCLATE 100 MG PO TABS: 100.0000 mg | ORAL_TABLET | Freq: Two times a day (BID) | ORAL | 0 refills | Status: DC

## 2023-09-25 NOTE — Progress Notes (Signed)
 Subjective:  Patient ID: Marcus Reynolds, male    DOB: 1946-03-21  Age: 78 y.o. MRN: [id number]  CC:  Chief Complaint  Patient presents with   Mass    Pt notes mass on near scrotum and first noticed Tuesday, notes may have gotten a bit smaller since that time, no pain, urination normal, history of lipoma     HPI Horst Ostermiller presents for acute issue as above.  PCP is Shirline Frees, NP  Scrotal mass First noticed 2 days ago - taking a shower. Tender area, noticed swelling. Left side - near scrotum, not on testicle. Seems like swelling a little better since Tuesday, less of a mass feel now. Less tender. No known injury, no bowel changes, hernia operation at age 57 on right, no recurrence.  No new urinary symptoms - no dysuria, hematuria, or hematospermia. Hx of BPH.  Normal PSA 02/20/23.  Chart was reviewed for above information.  Tx: none.      History Patient Active Problem List   Diagnosis Date Noted   Osteoarthritis of spine with radiculopathy, lumbar region 08/04/2017   Routine general medical examination at a health care facility 09/23/2013   ED (erectile dysfunction) 07/23/2011   History of colonic polyps 12/25/2010   HYPERTRIGLYCERIDEMIA 06/13/2009   Past Medical History:  Diagnosis Date   Hyperlipidemia    high tri   Personal history of colonic polyps 12/25/2010   Past Surgical History:  Procedure Laterality Date   BACK SURGERY     CATARACT EXTRACTION Bilateral 10/2021   COLONOSCOPY  12/25/2010   internal hemorrhoids   COLONOSCOPY W/ POLYPECTOMY  06/10/2005   [place], [place]   EYE SURGERY Right    INGUINAL HERNIA REPAIR     right side   laminectomy  03/2022   L3-L5   TONSILECTOMY, ADENOIDECTOMY, BILATERAL MYRINGOTOMY AND TUBES     No Known Allergies Prior to Admission medications   Medication Sig Start Date End Date Taking? Authorizing Provider  clonazePAM (KLONOPIN) 0.5 MG tablet Take 0.5-1 tablets (0.25-0.5 mg total) by mouth at bedtime as needed for anxiety.  02/20/23  Yes Nafziger, Kandee Keen, NP  gemfibrozil (LOPID) 600 MG tablet Take 1 tablet (600 mg total) by mouth 2 (two) times daily before a meal. 02/20/23  Yes Nafziger, Kandee Keen, NP  Multiple Vitamin (MULTIVITAMIN) tablet Take 1 tablet by mouth daily.   Yes [provider]   Social History   Socioeconomic History   Marital status: Married    Spouse name: Not on file   Number of children: 2   Years of education: Not on file   Highest education level: Professional school degree (e.g., MD, DDS, DVM, JD)  Occupational History   Occupation: Retired  Tobacco Use   Smoking status: Never   Smokeless tobacco: Never  Vaping Use   Vaping status: Never Used  Substance and Sexual Activity   Alcohol use: Yes    Alcohol/week: 1.0 standard drink of alcohol    Types: 1 Glasses of wine per week    Comment: socially   Drug use: No   Sexual activity: Not on file  Other Topics Concern   Not on file  Social History Narrative   Retired   [facility] [employer] 2   Married    Volunteers at [employer]    Social Drivers of [employer]: Low Risk  (08/12/2022)   Overall Financial Resource Strain (CARDIA)    Difficulty of Paying Living Expenses: Not hard at all  Food Insecurity:  No Food Insecurity (08/12/2022)   Hunger Vital Sign    Worried About Running Out of Food in the Last Year: Never true    Ran Out of Food in the Last Year: Never true  Transportation Needs: No Transportation Needs (08/12/2022)   PRAPARE - Administrator, Civil Service (Medical): No    Lack of Transportation (Non-Medical): No  Physical Activity: Sufficiently Active (08/12/2022)   Exercise Vital Sign    Days of Exercise per Week: 5 days    Minutes of Exercise per Session: 60 min  Stress: No Stress Concern Present (08/12/2022)   [employer] of Occupational Health - Occupational Stress Questionnaire    Feeling of Stress : Not at all  Social Connections: Socially Integrated (08/12/2022)   Social Connection  and Isolation Panel [NHANES]    Frequency of Communication with Friends and Family: More than three times a week    Frequency of Social Gatherings with Friends and Family: More than three times a week    Attends Religious Services: More than 4 times per year    Active Member of [employer] or Organizations: Yes    Attends Engineer, structural: More than 4 times per year    Marital Status: Married  Catering manager Violence: Not At Risk (08/12/2022)   Humiliation, Afraid, Rape, and Kick questionnaire    Fear of Current or Ex-Partner: No    Emotionally Abused: No    Physically Abused: No    Sexually Abused: No    Review of Systems   Objective:   Vitals:   09/25/23 1132  BP: 124/60  Pulse: 86  Temp: 98.5 F (36.9 C)  TempSrc: Temporal  SpO2: 94%  Weight: 185 lb 3.2 oz (84 kg)  Height: 6' 1.25" (1.861 m)     Physical Exam Constitutional:      General: He is not in acute distress.    Appearance: Normal appearance. He is well-developed.  HENT:     Head: Normocephalic and atraumatic.  Cardiovascular:     Rate and Rhythm: Normal rate.  Pulmonary:     Effort: Pulmonary effort is normal.  Genitourinary:      Comments:  No inguinal lymphadenopathy or hernia appreciated.  Testicles, epididymis nontender bilaterally, no nodules appreciated, no penile discharge or rash.  Neurological:     Mental Status: He is alert and oriented to person, place, and time.  Psychiatric:        Mood and Affect: Mood normal.    31 minutes spent during visit, including chart review, discussion of skin lesion, symptoms, counseling and assimilation of information, exam, discussion of plan, and chart completion.      Assessment & Plan:  [name] is a 78 y.o. male . Abscess of left groin - Plan: doxycycline (VIBRA-TABS) 100 MG tablet Small abscess lateral to scrotum on the left.  Reports improving size of past few days.  Induration approximately 1 cm without central fluctuance, incision  and drainage deferred at this time.  No systemic symptoms.  Will treat with doxycycline initially along with warm compresses, gentle pressure, urgent care precautions given over the weekend with RTC precautions if not improving into next week.  Handout given, understanding of plan expressed.  Potential side effects and risks of antibiotics discussed.  Meds ordered this encounter  Medications   doxycycline (VIBRA-TABS) 100 MG tablet    Sig: Take 1 tablet (100 mg total) by mouth 2 (two) times daily.    Dispense:  14 tablet  Refill:  0   Patient Instructions  Area appears to be a small skin infection or abscess, or could be ingrown hair to that area that can appear similarly.  It is about a centimeter wide round area underneath the skin at this time.  Glad to hear that is improving, but I think it would be reasonable to treat this as a possible skin infection or abscess with antibiotics twice per day for the next week, warm compresses 3 times per day with gentle pressure on area when bathing to see if any discharge occurs.  It is okay if pus is expressed as long as it is continue to improve.  If you notice worsening swelling, worsening pain or other new/worsening symptoms I recommend recheck either with one of our providers or urgent care over the weekend if needed.  I do not expect that to occur.  If not improving into next week, I am happy to see you again.  Take care!  Skin Abscess  A skin abscess is an infected spot of skin. It can have pus in it. An abscess can happen in any part of your body. Some abscesses break open (rupture) on their own. Most keep getting worse unless they are treated. If your abscess is not treated, the infection can spread deeper into your body and blood. This can make you feel sick. What are the causes? Germs that enter your skin. This may happen if you have: A cut or scrape. A wound from a needle or an insect bite. Blocked oil or sweat glands. A problem with the  spot where your hair goes into your skin. A fluid-filled sac called a cyst under your skin. What increases the risk? Having problems with how your blood moves through your body. Having a weak body defense system (immune system). Having diabetes. Having dry and irritated skin. Needing to get shots often. Putting drugs into your body with a needle. Having a splinter or something else in your skin. Smoking. What are the signs or symptoms? A firm bump under your skin that hurts. A bump with pus at the top. Redness and swelling. Warm or tender spots. A sore on the skin. How is this treated? You may need to: Put a heat pack or a warm, wet washcloth on the spot. Have the pus drained. Take antibiotics. Follow these instructions at home: Medicines Take over-the-counter and prescription medicines only as told by your doctor. If you were prescribed antibiotics, take them as told by your doctor. Do not stop taking them even if you start to feel better. Abscess care  If you have an abscess that has not drained, put heat on it. Use the heat source that your doctor recommends, such as a moist heat pack or a heating pad. Place a towel between your skin and the heat source. Leave the heat on for 20-30 minutes. If your skin turns bright red, take off the heat right away to prevent burns. The risk of burns is higher if you cannot feel pain, heat, or cold. Follow instructions from your doctor about how to take care of your abscess. Make sure you: Cover the abscess with a bandage. Wash your hands with soap and water for at least 20 seconds before and after you change your bandage. If you cannot use soap and water, use hand sanitizer. Change your bandage as told by your doctor. Check your abscess every day for signs that the infection is getting worse. Check for: More redness, swelling, or pain.  More fluid or blood. Warmth. More pus or a worse smell. General instructions To keep the infection  from spreading: Do not share personal items or towels. Do not go in a hot tub with others. Avoid making skin contact with others. Be careful when you get rid of used bandages or any pus from the abscess. Do not smoke or use any products that contain nicotine or tobacco. If you need help quitting, ask your doctor. Contact a doctor if: You see red streaks on your skin near the abscess. You have any signs of worse infection. You vomit every time you eat or drink. You have a fever, chills, or muscle aches. The cyst or abscess comes back. Get help right away if: You have very bad pain. You make less pee (urine) than normal. This information is not intended to replace advice given to you by your health care provider. Make sure you discuss any questions you have with your health care provider. Document Revised: 01/09/2022 Document Reviewed: 01/09/2022 Elsevier Patient Education  2024 Elsevier Inc.    Signed,   Caro Christmas, MD [facility], [facility] 09/25/23 12:11 PM

## 2023-09-25 NOTE — Patient Instructions (Signed)
 Area appears to be a small skin infection or abscess, or could be ingrown hair to that area that can appear similarly.  It is about a centimeter wide round area underneath the skin at this time.  Glad to hear that is improving, but I think it would be reasonable to treat this as a possible skin infection or abscess with antibiotics twice per day for the next week, warm compresses 3 times per day with gentle pressure on area when bathing to see if any discharge occurs.  It is okay if pus is expressed as long as it is continue to improve.  If you notice worsening swelling, worsening pain or other new/worsening symptoms I recommend recheck either with one of our providers or urgent care over the weekend if needed.  I do not expect that to occur.  If not improving into next week, I am happy to see you again.  Take care!  Skin Abscess  A skin abscess is an infected spot of skin. It can have pus in it. An abscess can happen in any part of your body. Some abscesses break open (rupture) on their own. Most keep getting worse unless they are treated. If your abscess is not treated, the infection can spread deeper into your body and blood. This can make you feel sick. What are the causes? Germs that enter your skin. This may happen if you have: A cut or scrape. A wound from a needle or an insect bite. Blocked oil or sweat glands. A problem with the spot where your hair goes into your skin. A fluid-filled sac called a cyst under your skin. What increases the risk? Having problems with how your blood moves through your body. Having a weak body defense system (immune system). Having diabetes. Having dry and irritated skin. Needing to get shots often. Putting drugs into your body with a needle. Having a splinter or something else in your skin. Smoking. What are the signs or symptoms? A firm bump under your skin that hurts. A bump with pus at the top. Redness and swelling. Warm or tender spots. A sore on  the skin. How is this treated? You may need to: Put a heat pack or a warm, wet washcloth on the spot. Have the pus drained. Take antibiotics. Follow these instructions at home: Medicines Take over-the-counter and prescription medicines only as told by your doctor. If you were prescribed antibiotics, take them as told by your doctor. Do not stop taking them even if you start to feel better. Abscess care  If you have an abscess that has not drained, put heat on it. Use the heat source that your doctor recommends, such as a moist heat pack or a heating pad. Place a towel between your skin and the heat source. Leave the heat on for 20-30 minutes. If your skin turns bright red, take off the heat right away to prevent burns. The risk of burns is higher if you cannot feel pain, heat, or cold. Follow instructions from your doctor about how to take care of your abscess. Make sure you: Cover the abscess with a bandage. Wash your hands with soap and water for at least 20 seconds before and after you change your bandage. If you cannot use soap and water, use hand sanitizer. Change your bandage as told by your doctor. Check your abscess every day for signs that the infection is getting worse. Check for: More redness, swelling, or pain. More fluid or blood. Warmth. More pus or  a worse smell. General instructions To keep the infection from spreading: Do not share personal items or towels. Do not go in a hot tub with others. Avoid making skin contact with others. Be careful when you get rid of used bandages or any pus from the abscess. Do not smoke or use any products that contain nicotine or tobacco. If you need help quitting, ask your doctor. Contact a doctor if: You see red streaks on your skin near the abscess. You have any signs of worse infection. You vomit every time you eat or drink. You have a fever, chills, or muscle aches. The cyst or abscess comes back. Get help right away if: You  have very bad pain. You make less pee (urine) than normal. This information is not intended to replace advice given to you by your health care provider. Make sure you discuss any questions you have with your health care provider. Document Revised: 01/09/2022 Document Reviewed: 01/09/2022 Elsevier Patient Education  2024 [employer].

## 2023-09-30 ENCOUNTER — Ambulatory Visit: Admitting: Adult Health

## 2023-12-02 ENCOUNTER — Encounter: Payer: Self-pay | Admitting: Family Medicine

## 2023-12-02 ENCOUNTER — Ambulatory Visit (INDEPENDENT_AMBULATORY_CARE_PROVIDER_SITE_OTHER): Admitting: Family Medicine

## 2023-12-02 DIAGNOSIS — Z0000 Encounter for general adult medical examination without abnormal findings: Secondary | ICD-10-CM

## 2023-12-02 NOTE — Patient Instructions (Addendum)
 I really enjoyed getting to talk with you today! I am available on Tuesdays and Thursdays for virtual visits if you have any questions or concerns, or if I can be of any further assistance.   CHECKLIST FROM ANNUAL WELLNESS VISIT:  -Follow up (please call to schedule if not scheduled after visit):   -yearly for annual wellness visit with primary care office  Here is a list of your preventive care/health maintenance measures and the plan for each if any are due:  PLAN For any measures below that may be due:    1. If you would like to do the covid-19 vaccine you can do so at the pharmacy. Please let us  know if you do so that we can update your record.  Health Maintenance  Topic Date Due   COVID-19 Vaccine (8 - 2024-25 season) 12/18/2023 (Originally 04/18/2023)   INFLUENZA VACCINE  01/09/2024   DTaP/Tdap/Td (2 - Tdap) 09/26/2024   Medicare Annual Wellness (AWV)  12/01/2024   Colonoscopy  01/16/2025   Pneumococcal Vaccine: 50+ Years  Completed   Hepatitis C Screening  Completed   Zoster Vaccines- Shingrix  Completed   Hepatitis B Vaccines  Aged Out   HPV VACCINES  Aged Out   Meningococcal B Vaccine  Aged Out    -See a dentist at least yearly  -Get your eyes checked and then per your eye specialist's recommendations  -Other issues addressed today:   -I have included below further information regarding a healthy whole foods based diet, physical activity guidelines for adults, stress management and opportunities for social connections. I hope you find this information useful.   -----------------------------------------------------------------------------------------------------------------------------------------------------------------------------------------------------------------------------------------------------------    NUTRITION: -eat real food: lots of colorful vegetables (half the plate) and fruits -5-7 servings of vegetables and fruits per day (fresh or steamed is  best), exp. 2 servings of vegetables with lunch and dinner and 2 servings of fruit per day. Berries and greens such as kale and collards are great choices.  -consume on a regular basis:  fresh fruits, fresh veggies, fish, nuts, seeds, healthy oils (such as olive oil, avocado oil), whole grains (make sure for bread/pasta/crackers/etc., that the first ingredient on label contains the word whole), legumes. -can eat small amounts of dairy and lean meat (no larger than the palm of your hand), but avoid processed meats such as ham, bacon, lunch meat, etc. -drink water -try to avoid fast food and pre-packaged foods, processed meat, ultra processed foods/beverages (donuts, candy, etc.) -most experts advise limiting sodium to < 2300mg  per day, should limit further is any chronic conditions such as high blood pressure, heart disease, diabetes, etc. The American Heart Association advised that < 1500mg  is is ideal -try to avoid foods/beverages that contain any ingredients with names you do not recognize  -try to avoid foods/beverages  with added sugar or sweeteners/sweets  -try to avoid sweet drinks (including diet drinks): soda, juice, Gatorade, sweet tea, power drinks, diet drinks -try to avoid white rice, white bread, pasta (unless whole grain)  EXERCISE GUIDELINES FOR ADULTS: -if you wish to increase your physical activity, do so gradually and with the approval of your doctor -STOP and seek medical care immediately if you have any chest pain, chest discomfort or trouble breathing when starting or increasing exercise  -move and stretch your body, legs, feet and arms when sitting for long periods -Physical activity guidelines for optimal health in adults: -get at least 150 minutes per week of moderate exercise (can talk, but not sing); this is about  20-30 minutes of sustained activity 5-7 days per week or two 10-15 minute episodes of sustained activity 5-7 days per week -do some muscle building/resistance  training/strength training at least 2 days per week  -balance exercises 3+ days per week:   Stand somewhere where you have something sturdy to hold onto if you lose balance    1) lift up on toes, then back down, start with 5x per day and work up to 20x   2) stand and lift one leg straight out to the side so that foot is a few inches of the floor, start with 5x each side and work up to 20x each side   3) stand on one foot, start with 5 seconds each side and work up to 20 seconds on each side  If you need ideas or help with getting more active:  -Silver sneakers https://tools.silversneakers.com  -Walk with a Doc: http://www.duncan-williams.com/  -try to include resistance (weight lifting/strength building) and balance exercises twice per week: or the following link for ideas: http://castillo-powell.com/  BuyDucts.dk  STRESS MANAGEMENT: -can try meditating, or just sitting quietly with deep breathing while intentionally relaxing all parts of your body for 5 minutes daily -if you need further help with stress, anxiety or depression please follow up with your primary doctor or contact the wonderful folks at [facility]: [phone]  SOCIAL CONNECTIONS: -options in [place] if you wish to engage in more social and exercise related activities:  -Silver sneakers https://tools.silversneakers.com  -Walk with a Doc: http://www.duncan-williams.com/  -Check out the [facility] Active Adults 50+ section on the [place] of [employer] (hiking clubs, book clubs, cards and games, chess, exercise classes, aquatic classes and much more) - see the website for details: https://www.[place].gov/departments/parks-recreation/active-adults50  -YouTube has lots of exercise videos for different ages and abilities as well  -[facility] (a variety of indoor and outdoor inperson activities for  adults). 6077982015. [address]  -Virtual Online Classes (a variety of topics): see seniorplanet.org or call 9713159560  -consider volunteering at a school, hospice center, church, senior center or elsewhere

## 2023-12-02 NOTE — Progress Notes (Signed)
 PATIENT CHECK-IN and HEALTH RISK ASSESSMENT QUESTIONNAIRE:  -completed by phone/video for upcoming Medicare Preventive Visit  Pre-Visit Check-in: 1)Vitals (height, wt, BP, etc) - record in vitals section for visit on day of visit Request home vitals (wt, BP, etc.) and enter into vitals, THEN update Vital Signs SmartPhrase below at the top of the HPI. See below.  2)Review and Update Medications, Allergies PMH, Surgeries, Social history in Epic 3)Hospitalizations in the last year with date/reason? NO   4)Review and Update Care Team (patient's specialists) in Epic 5) Complete PHQ9 in Epic  6) Complete Fall Screening in Epic 7)Review all Health Maintenance Due and order under PCP if not done.  Medicare Wellness Patient Questionnaire:  Answer theses question about your habits: How often do you have a drink containing alcohol?Yes, once a week  How many drinks containing alcohol do you have on a typical day when you are drinking?one glass of wine  How often do you have six or more drinks on one occasion?Never  Have you ever smoked?No  Quit date if applicable? Na   How many packs a day do/did you smoke? Na  Do you use smokeless tobacco?Na  Do you use an illicit drugs?Na  On average, how many days per week do you engage in moderate to strenuous exercise (like a brisk walk)?3 mile walks 5 times a week, also does 50 minute strength/balance and mobility class once a week On average, how many minutes do you engage in exercise at this level?1 hour  Are you sexually active? 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 Do you have any difficulty performing your everyday activities?No Are you having any difficulty walking, taking medications on your own, and or difficulty managing daily home needs?No  Do you have difficulty walking or climbing stairs?No  Do you have difficulty dressing or bathing?No  Do you have difficulty doing errands alone such as visiting a doctor's office or shopping?No  Do you currently have any difficulty preparing food and eating?NO  Do you currently have any difficulty using the toilet?No  Do you have any difficulty managing your finances?No  Do you have any difficulties with housekeeping of managing your housekeeping?NO    Do you have Advanced Directives in place (Living Will, Healthcare Power or Attorney)? Yes    Last eye Exam and location?Dec 2024, Dr. Marcey, [facility]    Do you currently use prescribed or non-prescribed narcotic or opioid pain medications?Yes   Do you have a history or close family history of breast, ovarian, tubal or peritoneal cancer or a family member with BRCA (breast cancer susceptibility 1 and 2) gene mutations? No    Nurse/Assistant Credentials/time stamp:Marcus Reynolds CMA 10:09 am    ----------------------------------------------------------------------------------------------------------------------------------------------------------------------------------------------------------------------  Because this visit was a virtual/telehealth visit, some criteria may be missing or patient reported. Any vitals not  documented were not able to be obtained and vitals that have been documented are patient reported.    MEDICARE ANNUAL PREVENTIVE CARE VISIT WITH  PROVIDER (Welcome to Medicare, initial annual wellness or annual wellness exam)  Virtual Visit via Phone Note  I connected with Marcus Reynolds on 12/02/23  by phone and verified that I am speaking with the correct person using two identifiers. He prefers a phone visit.   Location patient: home Location provider:work or home office Persons participating in the virtual visit: patient, provider  Concerns and/or follow up today: no new concerns.   See HM section in Epic for other details of completed HM.    ROS: negative for report of fevers, unintentional weight loss, vision changes, vision loss, hearing loss or change, chest pain, sob, hemoptysis, melena, hematochezia, hematuria, falls, bleeding or bruising, thoughts of suicide or self harm, memory loss  Patient-completed extensive health risk assessment - reviewed and discussed with the patient: See Health Risk Assessment completed with patient prior to the visit either above or in recent phone note. This was reviewed in detailed with the patient today and appropriate recommendations, orders and referrals were placed as needed per Summary below and patient instructions.   Review of Medical History: -PMH, PSH, Family History and current specialty and care providers reviewed and updated and listed below   Patient Care Team: Marcus Huxley, NP as PCP - General (Family Medicine) Marcus Nottingham, MD as Consulting Physician (Dermatology)   Past Medical History:  Diagnosis Date   Hyperlipidemia    high tri   Personal history of colonic polyps 12/25/2010    Past Surgical History:  Procedure Laterality Date   BACK SURGERY     CATARACT EXTRACTION Bilateral 10/2021   COLONOSCOPY  12/25/2010   internal hemorrhoids   COLONOSCOPY W/ POLYPECTOMY  06/10/2005   Marcus Reynolds, Marcus Reynolds   EYE SURGERY Right    INGUINAL HERNIA REPAIR     right side   laminectomy  03/2022   L3-L5   TONSILECTOMY, ADENOIDECTOMY, BILATERAL MYRINGOTOMY AND TUBES       Social History   Socioeconomic History   Marital status: Married    Spouse name: Not on file   Number of children: 2   Years of education: Not on file   Highest education level: Professional school degree (e.g., MD, DDS, DVM, JD)  Occupational History   Occupation: Retired  Tobacco Use   Smoking status: Never   Smokeless tobacco: Never  Vaping Use   Vaping status: Never Used  Substance and Sexual Activity   Alcohol use: Yes    Alcohol/week: 1.0 standard drink of alcohol    Types: 1 Glasses of wine per week    Comment: socially   Drug use: No   Sexual activity: Not on file  Other Topics Concern   Not on file  Social History Narrative   Retired   [employer] 2   Married    Volunteers at [employer]    Social Drivers of [employer]: Low Risk  (08/12/2022)   Overall Financial Resource Strain (CARDIA)    Difficulty of Paying Living Expenses: Not hard at all  Food Insecurity: No Food Insecurity (08/12/2022)   Hunger Vital Sign    Worried About Running Out of Food in the Last Year: Never true    Ran Out of Food in the Last Year: Never true  Transportation Needs: No Transportation Needs (08/12/2022)   PRAPARE - Transportation    Lack of Transportation (  Medical): No    Lack of Transportation (Non-Medical): No  Physical Activity: Sufficiently Active (08/12/2022)   Exercise Vital Sign    Days of Exercise per Week: 5 days    Minutes of Exercise per Session: 60 min  Stress: No Stress Concern Present (08/12/2022)   [employer] of Occupational Health - Occupational Stress Questionnaire    Feeling of Stress : Not at all  Social Connections: Socially Integrated (08/12/2022)   Social Connection and Isolation Panel    Frequency of Communication with Friends and Family: More than three times a week    Frequency of Social Gatherings with Friends and Family: More than three times a week    Attends Religious Services: More than 4 times per year    Active Member  of [employer] or Organizations: Yes    Attends Engineer, structural: More than 4 times per year    Marital Status: Married  Catering manager Violence: Not At Risk (08/12/2022)   Humiliation, Afraid, Rape, and Kick questionnaire    Fear of Current or Ex-Partner: No    Emotionally Abused: No    Physically Abused: No    Sexually Abused: No    Family History  Problem Relation Age of Onset   Heart disease Mother    Diabetes Mother    Ulcerative colitis Mother    Colon cancer Neg Hx    Esophageal cancer Neg Hx    Rectal cancer Neg Hx    Stomach cancer Neg Hx     Current Outpatient Medications on File Prior to Visit  Medication Sig Dispense Refill   clonazePAM  (KLONOPIN ) 0.5 MG tablet Take 0.5-1 tablets (0.25-0.5 mg total) by mouth at bedtime as needed for anxiety. 20 tablet 3   gemfibrozil  (LOPID ) 600 MG tablet Take 1 tablet (600 mg total) by mouth 2 (two) times daily before a meal. 180 tablet 3   doxycycline  (VIBRA -TABS) 100 MG tablet Take 1 tablet (100 mg total) by mouth 2 (two) times daily. 14 tablet 0   Multiple Vitamin (MULTIVITAMIN) tablet Take 1 tablet by mouth daily. (Patient not taking: Reported on 12/02/2023)     No current facility-administered medications on file prior to visit.    No Known Allergies     Physical Exam Vitals requested from patient and listed below if patient had equipment and was able to obtain at home for this virtual visit: There were no vitals filed for this visit. Estimated body mass index is 24.27 kg/m as calculated from the following:   Height as of 09/25/23: 6' 1.25 (1.861 m).   Weight as of 09/25/23: 185 lb 3.2 oz (84 kg).  EKG (optional): deferred due to virtual visit  GENERAL: alert, oriented, no acute distress detected; full vision exam deferred due to pandemic and/or virtual encounter  PSYCH/NEURO: pleasant and cooperative, no obvious depression or anxiety, speech and thought processing grossly intact, Cognitive function grossly  intact  Flowsheet Row Office Visit from [facility]/02/2023 in [facility]  PHQ-9 Total Score 1        12/02/2023    9:56 AM 02/20/2023    8:30 AM 08/12/2022   10:00 AM 02/12/2022   10:09 AM 08/07/2021    1:58 PM  Depression screen PHQ 2/9  Decreased Interest 0 0 0 0 0  Down, Depressed, Hopeless 0 0 0 0 0  PHQ - 2 Score 0 0 0 0 0  Altered sleeping 1 0  1 0  Tired, decreased energy 0 0  0  0  Change in appetite 0 0  0 0  Feeling bad or failure about yourself  0 0  0 0  Trouble concentrating 0 0  0 0  Moving slowly or fidgety/restless 0 0  0 0  Suicidal thoughts 0 0  0 0  PHQ-9 Score 1 0  1 0  Difficult doing work/chores  Not difficult at all  Not difficult at all        02/08/2021   10:01 AM 08/07/2021   10:13 AM 08/12/2022   10:01 AM 02/20/2023    8:30 AM 12/02/2023    9:56 AM  Fall Risk  Falls in the past year? 0 0 0 0 0  Was there an injury with Fall? 0 0 0 0 0  Fall Risk Category Calculator 0 0 0 0 0  Fall Risk Category (Retired) Low  Low      (RETIRED) Patient Fall Risk Level Low fall risk  Low fall risk      Patient at Risk for Falls Due to  No Fall Risks No Fall Risks No Fall Risks No Fall Risks  Fall risk Follow up   Falls prevention discussed Falls evaluation completed Falls evaluation completed     Data saved with a previous flowsheet row definition     SUMMARY AND PLAN:  Encounter for Medicare annual wellness exam  Discussed applicable health maintenance/preventive health measures and advised and referred or ordered per patient preferences: -discussed covid vaccine recs Health Maintenance  Topic Date Due   COVID-19 Vaccine (8 - 2024-25 season) 12/18/2023 (Originally 04/18/2023)   INFLUENZA VACCINE  01/09/2024   DTaP/Tdap/Td (2 - Tdap) 09/26/2024   Medicare Annual Wellness (AWV)  12/01/2024   Colonoscopy  01/16/2025   Pneumococcal Vaccine: 50+ Years  Completed   Hepatitis C Screening  Completed   Zoster Vaccines- Shingrix  Completed    Hepatitis B Vaccines  Aged Out   HPV VACCINES  Aged Out   Meningococcal B Vaccine  Aged Out     Education and counseling on the following was provided based on the above review of health and a plan/checklist for the patient, along with additional information discussed, was provided for the patient in the patient instructions :   -Provided counseling and plan for difficulty hearing  - he is considering getting another hearing check -Provided counseling and plan for increased risk of falling if applicable per above screening. Reviewed and demonstrated safe balance exercises that can be done at home to improve balance and discussed exercise guidelines for adults with include balance exercises at least 3 days per week.  -Advised and counseled on a healthy lifestyle - including the importance of a healthy diet, regular physical activity, social connections  -Reviewed patient's current diet. Advised and counseled on a whole foods based healthy diet. A summary of a healthy diet was provided in the Patient Instructions.  -reviewed patient's current physical activity level and discussed exercise guidelines for adults. Discussed community resources and ideas for safe exercise at home to assist in meeting exercise guideline recommendations in a safe and healthy way.  -Advise yearly dental visits at minimum and regular eye exams   Follow up: see patient instructions   Patient Instructions  I really enjoyed getting to talk with you today! I am available on Tuesdays and Thursdays for virtual visits if you have any questions or concerns, or if I can be of any further assistance.   CHECKLIST FROM ANNUAL WELLNESS VISIT:  -Follow up (please call to  schedule if not scheduled after visit):   -yearly for annual wellness visit with primary care office  Here is a list of your preventive care/health maintenance measures and the plan for each if any are due:  PLAN For any measures below that may be due:    1.  If you would like to do the covid-19 vaccine you can do so at the pharmacy. Please let us  know if you do so that we can update your record.  Health Maintenance  Topic Date Due   COVID-19 Vaccine (8 - 2024-25 season) 12/18/2023 (Originally 04/18/2023)   INFLUENZA VACCINE  01/09/2024   DTaP/Tdap/Td (2 - Tdap) 09/26/2024   Medicare Annual Wellness (AWV)  12/01/2024   Colonoscopy  01/16/2025   Pneumococcal Vaccine: 50+ Years  Completed   Hepatitis C Screening  Completed   Zoster Vaccines- Shingrix  Completed   Hepatitis B Vaccines  Aged Out   HPV VACCINES  Aged Out   Meningococcal B Vaccine  Aged Out    -See a dentist at least yearly  -Get your eyes checked and then per your eye specialist's recommendations  -Other issues addressed today:   -I have included below further information regarding a healthy whole foods based diet, physical activity guidelines for adults, stress management and opportunities for social connections. I hope you find this information useful.   -----------------------------------------------------------------------------------------------------------------------------------------------------------------------------------------------------------------------------------------------------------    NUTRITION: -eat real food: lots of colorful vegetables (half the plate) and fruits -5-7 servings of vegetables and fruits per day (fresh or steamed is best), exp. 2 servings of vegetables with lunch and dinner and 2 servings of fruit per day. Berries and greens such as kale and collards are great choices.  -consume on a regular basis:  fresh fruits, fresh veggies, fish, nuts, seeds, healthy oils (such as olive oil, avocado oil), whole grains (make sure for bread/pasta/crackers/etc., that the first ingredient on label contains the word whole), legumes. -can eat small amounts of dairy and lean meat (no larger than the palm of your hand), but avoid processed meats such as  ham, bacon, lunch meat, etc. -drink water -try to avoid fast food and pre-packaged foods, processed meat, ultra processed foods/beverages (donuts, candy, etc.) -most experts advise limiting sodium to < 2300mg  per day, should limit further is any chronic conditions such as high blood pressure, heart disease, diabetes, etc. The American Heart Association advised that < 1500mg  is is ideal -try to avoid foods/beverages that contain any ingredients with names you do not recognize  -try to avoid foods/beverages  with added sugar or sweeteners/sweets  -try to avoid sweet drinks (including diet drinks): soda, juice, Gatorade, sweet tea, power drinks, diet drinks -try to avoid white rice, white bread, pasta (unless whole grain)  EXERCISE GUIDELINES FOR ADULTS: -if you wish to increase your physical activity, do so gradually and with the approval of your doctor -STOP and seek medical care immediately if you have any chest pain, chest discomfort or trouble breathing when starting or increasing exercise  -move and stretch your body, legs, feet and arms when sitting for long periods -Physical activity guidelines for optimal health in adults: -get at least 150 minutes per week of moderate exercise (can talk, but not sing); this is about 20-30 minutes of sustained activity 5-7 days per week or two 10-15 minute episodes of sustained activity 5-7 days per week -do some muscle building/resistance training/strength training at least 2 days per week  -balance exercises 3+ days per week:   Stand somewhere where you have  something sturdy to hold onto if you lose balance    1) lift up on toes, then back down, start with 5x per day and work up to 20x   2) stand and lift one leg straight out to the side so that foot is a few inches of the floor, start with 5x each side and work up to 20x each side   3) stand on one foot, start with 5 seconds each side and work up to 20 seconds on each side  If you need ideas or help  with getting more active:  -Silver sneakers https://tools.silversneakers.com  -Walk with a Doc: http://www.duncan-williams.com/  -try to include resistance (weight lifting/strength building) and balance exercises twice per week: or the following link for ideas: http://castillo-powell.com/  BuyDucts.dk  STRESS MANAGEMENT: -can try meditating, or just sitting quietly with deep breathing while intentionally relaxing all parts of your body for 5 minutes daily -if you need further help with stress, anxiety or depression please follow up with your primary doctor or contact the wonderful folks at [facility]: [phone]  SOCIAL CONNECTIONS: -options in [place] if you wish to engage in more social and exercise related activities:  -Silver sneakers https://tools.silversneakers.com  -Walk with a Doc: http://www.duncan-williams.com/  -Check out the [facility] Active Adults 50+ section on the [place] [employer] (hiking clubs, book clubs, cards and games, chess, exercise classes, aquatic classes and much more) - see the website for details: https://www.[place].gov/departments/parks-recreation/active-adults50  -YouTube has lots of exercise videos for different ages and abilities as well  -[facility] (a variety of indoor and outdoor inperson activities for adults). [phone]. [address]  -Virtual Online Classes (a variety of topics): see seniorplanet.org or call [phone]  -consider volunteering at a school, hospice center, church, senior center or elsewhere            Chiquita JONELLE Cramp, DO

## 2023-12-02 NOTE — Progress Notes (Signed)
 Patient was unable to self-report due to a lack of equipment at home via telehealth

## 2024-01-07 DIAGNOSIS — H2512 Age-related nuclear cataract, left eye: Secondary | ICD-10-CM | POA: Diagnosis not present

## 2024-01-07 DIAGNOSIS — H35033 Hypertensive retinopathy, bilateral: Secondary | ICD-10-CM | POA: Diagnosis not present

## 2024-01-07 DIAGNOSIS — H43812 Vitreous degeneration, left eye: Secondary | ICD-10-CM | POA: Diagnosis not present

## 2024-01-07 DIAGNOSIS — H35373 Puckering of macula, bilateral: Secondary | ICD-10-CM | POA: Diagnosis not present

## 2024-01-07 DIAGNOSIS — H26491 Other secondary cataract, right eye: Secondary | ICD-10-CM | POA: Diagnosis not present

## 2024-01-07 DIAGNOSIS — H35363 Drusen (degenerative) of macula, bilateral: Secondary | ICD-10-CM | POA: Diagnosis not present

## 2024-02-24 ENCOUNTER — Encounter: Payer: Self-pay | Admitting: Adult Health

## 2024-02-24 ENCOUNTER — Ambulatory Visit: Payer: Medicare HMO | Admitting: Adult Health

## 2024-02-24 ENCOUNTER — Ambulatory Visit: Payer: Self-pay | Admitting: Adult Health

## 2024-02-24 VITALS — BP 120/88 | HR 87 | Temp 97.6°F | Ht 73.25 in | Wt 181.0 lb

## 2024-02-24 DIAGNOSIS — F418 Other specified anxiety disorders: Secondary | ICD-10-CM

## 2024-02-24 DIAGNOSIS — E781 Pure hyperglyceridemia: Secondary | ICD-10-CM | POA: Diagnosis not present

## 2024-02-24 DIAGNOSIS — Z0000 Encounter for general adult medical examination without abnormal findings: Secondary | ICD-10-CM

## 2024-02-24 DIAGNOSIS — D0362 Melanoma in situ of left upper limb, including shoulder: Secondary | ICD-10-CM | POA: Diagnosis not present

## 2024-02-24 DIAGNOSIS — R351 Nocturia: Secondary | ICD-10-CM

## 2024-02-24 DIAGNOSIS — Z23 Encounter for immunization: Secondary | ICD-10-CM | POA: Diagnosis not present

## 2024-02-24 DIAGNOSIS — N401 Enlarged prostate with lower urinary tract symptoms: Secondary | ICD-10-CM

## 2024-02-24 LAB — CBC
HCT: 45.2 % (ref 39.0–52.0)
Hemoglobin: 15.1 g/dL (ref 13.0–17.0)
MCHC: 33.3 g/dL (ref 30.0–36.0)
MCV: 89.1 fl (ref 78.0–100.0)
Platelets: 230 K/uL (ref 150.0–400.0)
RBC: 5.08 Mil/uL (ref 4.22–5.81)
RDW: 13.2 % (ref 11.5–15.5)
WBC: 5.6 K/uL (ref 4.0–10.5)

## 2024-02-24 LAB — COMPREHENSIVE METABOLIC PANEL WITH GFR
ALT: 15 U/L (ref 0–53)
AST: 26 U/L (ref 0–37)
Albumin: 4.8 g/dL (ref 3.5–5.2)
Alkaline Phosphatase: 143 U/L — ABNORMAL HIGH (ref 39–117)
BUN: 20 mg/dL (ref 6–23)
CO2: 27 meq/L (ref 19–32)
Calcium: 10 mg/dL (ref 8.4–10.5)
Chloride: 105 meq/L (ref 96–112)
Creatinine, Ser: 1.17 mg/dL (ref 0.40–1.50)
GFR: 59.65 mL/min — ABNORMAL LOW (ref >60.00)
Glucose, Bld: 88 mg/dL (ref 70–99)
Potassium: 4.3 meq/L (ref 3.5–5.1)
Sodium: 135 meq/L (ref 135–145)
Total Bilirubin: 0.9 mg/dL (ref 0.2–1.2)
Total Protein: 7.1 g/dL (ref 6.0–8.3)

## 2024-02-24 LAB — PSA: PSA: 3.5 ng/mL (ref 0.10–4.00)

## 2024-02-24 LAB — LIPID PANEL
Cholesterol: 140 mg/dL (ref 0–200)
HDL: 40.9 mg/dL (ref >39.00)
LDL Cholesterol: 83 mg/dL (ref 0–99)
NonHDL: 99.09
Total CHOL/HDL Ratio: 3
Triglycerides: 81 mg/dL (ref 0.0–149.0)
VLDL: 16.2 mg/dL (ref 0.0–40.0)

## 2024-02-24 LAB — TSH: TSH: 3.12 u[IU]/mL (ref 0.35–5.50)

## 2024-02-24 MED ORDER — CLONAZEPAM 0.5 MG PO TABS: 0.2500 mg | ORAL_TABLET | Freq: Every evening | ORAL | 3 refills | Status: AC | PRN

## 2024-02-24 MED ORDER — GEMFIBROZIL 600 MG PO TABS: 600.0000 mg | ORAL_TABLET | Freq: Two times a day (BID) | ORAL | 3 refills | Status: AC

## 2024-02-24 NOTE — Patient Instructions (Signed)
 It was great seeing you today   We will follow up with you regarding your lab work   Please let me know if you need anything

## 2024-02-24 NOTE — Progress Notes (Signed)
 Subjective:    Patient ID: Marcus Reynolds, male    DOB: 28-Dec-1945, 78 y.o.   MRN: [id number]  HPI Patient presents for yearly preventative medicine examination. He is a pleasant 78 year old male who  has a past medical history of Hyperlipidemia and Personal history of colonic polyps (12/25/2010).  Hypertriglyceridemia-prescribed Lopid  600 mg twice daily and aspirin 81 mg daily.  He denies myalgia or fatigue Lab Results  Component Value Date   CHOL 149 02/20/2023   HDL 39.30 02/20/2023   LDLCALC 89 02/20/2023   TRIG 102.0 02/20/2023   CHOLHDL 4 02/20/2023   BPH- he does have nocturia and incomplete bladder emptying - getting up 2 times a night.   Travel Anxiety -we will use Klonopin  0.5 mg as needed when traveling. He does have some trips coming up and needs a refill.   Melanoma of upper limb- Removed in October 2024 by Dermatology. He does a follow up with Dermatology this month.    All immunizations and health maintenance protocols were reviewed with the patient and needed orders were placed.  Appropriate screening laboratory values were ordered for the patient including screening of hyperlipidemia, renal function and hepatic function. If indicated by BPH, a PSA was ordered.  Medication reconciliation,  past medical history, social history, problem list and allergies were reviewed in detail with the patient  Goals were established with regard to weight loss, exercise, and  diet in compliance with medications. He has joined a Administrator, Civil Service and has also started doing a strengthening and balance class. He is eating healthy.   He does not have any acute issues    Review of Systems  Constitutional: Negative.   HENT: Negative.    Eyes: Negative.   Respiratory: Negative.    Cardiovascular: Negative.   Gastrointestinal: Negative.   Endocrine: Negative.   Genitourinary:  Positive for difficulty urinating.  Musculoskeletal: Negative.   Skin: Negative.   Allergic/Immunologic: Negative.    Neurological: Negative.   Hematological: Negative.   Psychiatric/Behavioral: Negative.    All other systems reviewed and are negative.  Past Medical History:  Diagnosis Date   Hyperlipidemia    high tri   Personal history of colonic polyps 12/25/2010    Social History   Socioeconomic History   Marital status: Married    Spouse name: Not on file   Number of children: 2   Years of education: Not on file   Highest education level: Professional school degree (e.g., MD, DDS, DVM, JD)  Occupational History   Occupation: Retired  Tobacco Use   Smoking status: Never   Smokeless tobacco: Never  Vaping Use   Vaping status: Never Used  Substance and Sexual Activity   Alcohol use: Yes    Alcohol/week: 1.0 standard drink of alcohol    Types: 1 Glasses of wine per week    Comment: socially   Drug use: No   Sexual activity: Not on file  Other Topics Concern   Not on file  Social History Narrative   Retired   [employer] 2   Married    Volunteers at [employer]    Social Drivers of [employer]: Low Risk  (08/12/2022)   Overall Financial Resource Strain (CARDIA)    Difficulty of Paying Living Expenses: Not hard at all  Food Insecurity: No Food Insecurity (08/12/2022)   Hunger Vital Sign    Worried About Running Out of Food in the Last Year: Never true    Ran  Out of Food in the Last Year: Never true  Transportation Needs: No Transportation Needs (08/12/2022)   PRAPARE - Administrator, Civil Service (Medical): No    Lack of Transportation (Non-Medical): No  Physical Activity: Sufficiently Active (08/12/2022)   Exercise Vital Sign    Days of Exercise per Week: 5 days    Minutes of Exercise per Session: 60 min  Stress: No Stress Concern Present (08/12/2022)   [employer] of Occupational Health - Occupational Stress Questionnaire    Feeling of Stress : Not at all  Social Connections: Socially Integrated (08/12/2022)   Social Connection and  Isolation Panel    Frequency of Communication with Friends and Family: More than three times a week    Frequency of Social Gatherings with Friends and Family: More than three times a week    Attends Religious Services: More than 4 times per year    Active Member of [employer] or Organizations: Yes    Attends Banker Meetings: More than 4 times per year    Marital Status: Married  Catering manager Violence: Not At Risk (08/12/2022)   Humiliation, Afraid, Rape, and Kick questionnaire    Fear of Current or Ex-Partner: No    Emotionally Abused: No    Physically Abused: No    Sexually Abused: No    Past Surgical History:  Procedure Laterality Date   BACK SURGERY     CATARACT EXTRACTION Bilateral 10/2021   COLONOSCOPY  12/25/2010   internal hemorrhoids   COLONOSCOPY W/ POLYPECTOMY  06/10/2005   [place], [place]   EYE SURGERY Right    INGUINAL HERNIA REPAIR     right side   laminectomy  03/2022   L3-L5   TONSILECTOMY, ADENOIDECTOMY, BILATERAL MYRINGOTOMY AND TUBES      Family History  Problem Relation Age of Onset   Heart disease Mother    Diabetes Mother    Ulcerative colitis Mother    Colon cancer Neg Hx    Esophageal cancer Neg Hx    Rectal cancer Neg Hx    Stomach cancer Neg Hx     No Known Allergies  No current outpatient medications on file prior to visit.   No current facility-administered medications on file prior to visit.    BP 120/88   Pulse 87   Temp 97.6 F (36.4 C) (Oral)   Ht 6' 1.25 (1.861 m)   Wt 181 lb (82.1 kg)   SpO2 95%   BMI 23.72 kg/m       Objective:   Physical Exam Vitals and nursing note reviewed.  Constitutional:      General: He is not in acute distress.    Appearance: Normal appearance. He is not ill-appearing.  HENT:     Head: Normocephalic and atraumatic.     Right Ear: Tympanic membrane, ear canal and external ear normal. There is no impacted cerumen.     Left Ear: Tympanic membrane, ear canal and external ear normal.  There is no impacted cerumen.     Nose: Nose normal. No congestion or rhinorrhea.     Mouth/Throat:     Mouth: Mucous membranes are moist.     Pharynx: Oropharynx is clear.  Eyes:     Extraocular Movements: Extraocular movements intact.     Conjunctiva/sclera: Conjunctivae normal.     Pupils: Pupils are equal, round, and reactive to light.  Neck:     Vascular: No carotid bruit.  Cardiovascular:     Rate and Rhythm:  Normal rate and regular rhythm.     Pulses: Normal pulses.     Heart sounds: No murmur heard.    No friction rub. No gallop.  Pulmonary:     Effort: Pulmonary effort is normal.     Breath sounds: Normal breath sounds.  Abdominal:     General: Abdomen is flat. Bowel sounds are normal. There is no distension.     Palpations: Abdomen is soft. There is no mass.     Tenderness: There is no abdominal tenderness. There is no guarding or rebound.     Hernia: No hernia is present.  Musculoskeletal:        General: Normal range of motion.     Cervical back: Normal range of motion and neck supple.  Lymphadenopathy:     Cervical: No cervical adenopathy.  Skin:    General: Skin is warm and dry.     Capillary Refill: Capillary refill takes less than 2 seconds.  Neurological:     General: No focal deficit present.     Mental Status: He is alert and oriented to person, place, and time.  Psychiatric:        Mood and Affect: Mood normal.        Behavior: Behavior normal.        Thought Content: Thought content normal.        Judgment: Judgment normal.       Assessment & Plan:   1. Routine general medical examination at a health care facility (Primary) Today patient counseled on age appropriate routine health concerns for screening and prevention, each reviewed and up to date or declined. Immunizations reviewed and up to date or declined. Labs ordered and reviewed. Risk factors for depression reviewed and negative. Hearing function and visual acuity are intact. ADLs screened and  addressed as needed. Functional ability and level of safety reviewed and appropriate. Education, counseling and referrals performed based on assessed risks today. Patient provided with a copy of personalized plan for preventive services. - Continue to exercise and eat healthy  - Follow up in one year or sooner if needed  2. HYPERTRIGLYCERIDEMIA - Continue with Lopid  BID  - Lipid panel; Future - TSH; Future - CBC; Future - Comprehensive metabolic panel with GFR; Future - gemfibrozil  (LOPID ) 600 MG tablet; Take 1 tablet (600 mg total) by mouth 2 (two) times daily before a meal.  Dispense: 180 tablet; Refill: 3  3. BPH associated with nocturia - Does not want to start medication at this time. He will follow up if symptoms get worse.  - PSA; Future  4. Situational anxiety  - clonazePAM  (KLONOPIN ) 0.5 MG tablet; Take 0.5-1 tablets (0.25-0.5 mg total) by mouth at bedtime as needed for anxiety.  Dispense: 20 tablet; Refill: 3  5. 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 Need for influenza vaccination  - Flu vaccine HIGH DOSE PF(Fluzone Trivalent)  Logyn Kendrick, NP

## 2024-03-02 DIAGNOSIS — L820 Inflamed seborrheic keratosis: Secondary | ICD-10-CM | POA: Diagnosis not present

## 2024-03-02 DIAGNOSIS — D692 Other nonthrombocytopenic purpura: Secondary | ICD-10-CM | POA: Diagnosis not present

## 2024-03-02 DIAGNOSIS — D1801 Hemangioma of skin and subcutaneous tissue: Secondary | ICD-10-CM | POA: Diagnosis not present

## 2024-03-02 DIAGNOSIS — L570 Actinic keratosis: Secondary | ICD-10-CM | POA: Diagnosis not present

## 2024-03-02 DIAGNOSIS — L821 Other seborrheic keratosis: Secondary | ICD-10-CM | POA: Diagnosis not present

## 2024-03-02 DIAGNOSIS — Z85828 Personal history of other malignant neoplasm of skin: Secondary | ICD-10-CM | POA: Diagnosis not present

## 2024-03-02 DIAGNOSIS — C44719 Basal cell carcinoma of skin of left lower limb, including hip: Secondary | ICD-10-CM | POA: Diagnosis not present

## 2024-03-02 DIAGNOSIS — L814 Other melanin hyperpigmentation: Secondary | ICD-10-CM | POA: Diagnosis not present

## 2024-03-12 ENCOUNTER — Encounter: Payer: Self-pay | Admitting: Adult Health

## 2024-05-20 DIAGNOSIS — H25812 Combined forms of age-related cataract, left eye: Secondary | ICD-10-CM | POA: Diagnosis not present

## 2024-05-20 DIAGNOSIS — H35372 Puckering of macula, left eye: Secondary | ICD-10-CM | POA: Diagnosis not present

## 2024-05-20 DIAGNOSIS — H26491 Other secondary cataract, right eye: Secondary | ICD-10-CM | POA: Diagnosis not present

## 2024-05-20 DIAGNOSIS — H43811 Vitreous degeneration, right eye: Secondary | ICD-10-CM | POA: Diagnosis not present

## 2025-02-24 ENCOUNTER — Encounter: Admitting: Adult Health
